# Patient Record
Sex: Female | Born: 1940
Health system: Southern US, Community
[De-identification: ages and names within clinical notes are randomized; demographics above are authoritative.]

## PROBLEM LIST (undated history)

## (undated) ENCOUNTER — Ambulatory Visit

## (undated) DIAGNOSIS — Z923 Personal history of irradiation: Secondary | ICD-10-CM

## (undated) DIAGNOSIS — K219 Gastro-esophageal reflux disease without esophagitis: Secondary | ICD-10-CM

## (undated) DIAGNOSIS — C50919 Malignant neoplasm of unspecified site of unspecified female breast: Secondary | ICD-10-CM

## (undated) DIAGNOSIS — E785 Hyperlipidemia, unspecified: Secondary | ICD-10-CM

## (undated) DIAGNOSIS — Z9221 Personal history of antineoplastic chemotherapy: Secondary | ICD-10-CM

## (undated) HISTORY — DX: Gastro-esophageal reflux disease without esophagitis: K21.9

## (undated) HISTORY — DX: Hyperlipidemia, unspecified: E78.5

## (undated) HISTORY — PX: BREAST SURGERY: SHX581

---

## 1998-05-03 DIAGNOSIS — C50919 Malignant neoplasm of unspecified site of unspecified female breast: Secondary | ICD-10-CM

## 1998-05-03 DIAGNOSIS — Z923 Personal history of irradiation: Secondary | ICD-10-CM

## 1998-05-03 DIAGNOSIS — Z9221 Personal history of antineoplastic chemotherapy: Secondary | ICD-10-CM

## 1998-05-03 HISTORY — DX: Personal history of antineoplastic chemotherapy: Z92.21

## 1998-05-03 HISTORY — PX: MASTECTOMY: SHX3

## 1998-05-03 HISTORY — DX: Personal history of irradiation: Z92.3

## 1998-05-03 HISTORY — DX: Malignant neoplasm of unspecified site of unspecified female breast: C50.919

## 2007-04-10 ENCOUNTER — Ambulatory Visit: Payer: Self-pay | Admitting: Unknown Physician Specialty

## 2007-09-17 ENCOUNTER — Ambulatory Visit: Payer: Self-pay | Admitting: Family Medicine

## 2007-09-24 ENCOUNTER — Ambulatory Visit: Payer: Self-pay | Admitting: Internal Medicine

## 2007-10-04 ENCOUNTER — Ambulatory Visit: Payer: Self-pay | Admitting: Internal Medicine

## 2008-08-13 ENCOUNTER — Ambulatory Visit: Payer: Self-pay | Admitting: Internal Medicine

## 2008-09-10 ENCOUNTER — Ambulatory Visit: Payer: Self-pay | Admitting: Internal Medicine

## 2008-09-10 LAB — PULMONARY FUNCTION TEST

## 2008-11-12 ENCOUNTER — Ambulatory Visit: Payer: Self-pay | Admitting: Internal Medicine

## 2009-10-21 LAB — HM PAP SMEAR: HM Pap smear: NORMAL

## 2011-01-22 ENCOUNTER — Telehealth: Payer: Self-pay | Admitting: Internal Medicine

## 2011-01-22 NOTE — Telephone Encounter (Signed)
Pt would like to get a rx for labs to take to dr Penny Pia  Cancer dr in [place] .  Her appointment is 04/02/11

## 2011-01-25 NOTE — Telephone Encounter (Signed)
Patient is asking for a rx for labs to take with her to Dr. Penny Pia. Patient would like to pick this up when it is ready.

## 2011-01-25 NOTE — Telephone Encounter (Signed)
Lori Caldwell,  Can you please start asking patients a little more information

## 2011-02-02 NOTE — Telephone Encounter (Signed)
Ashey,  I'm not sure if [facility] called this patient back re request for labs.  I do not have her chart abstracted so i do not know what labs she wanted to have done.

## 2011-02-19 ENCOUNTER — Ambulatory Visit (INDEPENDENT_AMBULATORY_CARE_PROVIDER_SITE_OTHER): Payer: Medicare Other

## 2011-02-19 DIAGNOSIS — Z23 Encounter for immunization: Secondary | ICD-10-CM

## 2011-04-05 ENCOUNTER — Telehealth: Payer: Self-pay | Admitting: Internal Medicine

## 2011-04-05 DIAGNOSIS — E785 Hyperlipidemia, unspecified: Secondary | ICD-10-CM

## 2011-04-05 NOTE — Telephone Encounter (Signed)
Patient is asking if there are any labs you would like her to have drawn while she is having labs done at Dr. Algis Liming office. If so she would like it faxed to there office before Friday so that she can have it all done while she there.

## 2011-04-05 NOTE — Telephone Encounter (Signed)
[phone]  Pt is having labs done dr Penny Pia in [place] on Friday 04/09/11.  Please fax an order for any labs that dr Darrick Huntsman would like done. Please call patient when this is faxed in  You can leave message on answering machine

## 2011-04-06 NOTE — Telephone Encounter (Signed)
Please print order for CMET, TSH and fasting lipids. And send to Dr Algis Liming office

## 2011-04-07 NOTE — Telephone Encounter (Signed)
Order placed, printed, and faxed. Patient notified.

## 2011-04-13 ENCOUNTER — Encounter: Payer: Self-pay | Admitting: Internal Medicine

## 2011-04-13 ENCOUNTER — Ambulatory Visit (INDEPENDENT_AMBULATORY_CARE_PROVIDER_SITE_OTHER): Payer: Medicare Other | Admitting: Internal Medicine

## 2011-04-13 VITALS — BP 122/64 | HR 81 | Temp 98.1°F | Resp 16 | Ht 67.0 in | Wt 173.8 lb

## 2011-04-13 DIAGNOSIS — Z853 Personal history of malignant neoplasm of breast: Secondary | ICD-10-CM | POA: Insufficient documentation

## 2011-04-13 DIAGNOSIS — E785 Hyperlipidemia, unspecified: Secondary | ICD-10-CM | POA: Insufficient documentation

## 2011-04-13 DIAGNOSIS — C50919 Malignant neoplasm of unspecified site of unspecified female breast: Secondary | ICD-10-CM

## 2011-04-13 DIAGNOSIS — K219 Gastro-esophageal reflux disease without esophagitis: Secondary | ICD-10-CM

## 2011-04-13 DIAGNOSIS — L259 Unspecified contact dermatitis, unspecified cause: Secondary | ICD-10-CM

## 2011-04-13 DIAGNOSIS — Z124 Encounter for screening for malignant neoplasm of cervix: Secondary | ICD-10-CM | POA: Insufficient documentation

## 2011-04-13 MED ORDER — HYDROCORTISONE ACE-PRAMOXINE 2.5-1 % RE CREA: TOPICAL_CREAM | RECTAL | Status: DC

## 2011-04-13 NOTE — Progress Notes (Signed)
Subjective:    Patient ID: Lori Caldwell, female    DOB: 12/22/1940, 70 y.o.   MRN: [id number]  HPI  Lori Caldwell is a 70 yo white female with a history of breast cancer s/p radical mastectomy .  Followed by Lori Caldwell at [facility], chronic cough with workup notable for reflux, presents for 6 month follouwp  Had a recent URI which has now resolved ,  But has had recent optho/ eval for left eye blurring with draining clear fluid for several months, with associated symptoms of pain (Feels scratchy) by the end of the day.  Getting a second opinion /appt next week.   Her cough has all but resolved with use of Nexium and occasional PM OTC PPI.  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  Past Medical History  Diagnosis Date  . breast cancer   . Hyperlipidemia   . GERD (gastroesophageal reflux disease)    No current outpatient prescriptions on file prior to visit.    . Review of Systems  Constitutional: Negative for fever, chills and unexpected weight change.  HENT: Negative for hearing loss, ear pain, nosebleeds, congestion, sore throat, facial swelling, rhinorrhea, sneezing, mouth sores, trouble swallowing, neck pain, neck stiffness, voice change, postnasal drip, sinus pressure, tinnitus and ear discharge.   Eyes: Negative for pain, discharge, redness and visual disturbance.  Respiratory: Positive for cough. Negative for chest tightness, shortness of breath, wheezing and stridor.   Cardiovascular: Negative for chest pain, palpitations and leg swelling.  Musculoskeletal: Negative for myalgias and arthralgias.  Skin: Negative for color change and rash.  Neurological: Negative for dizziness, weakness, light-headedness and headaches.  Hematological: Negative for adenopathy.   BP 122/64  Pulse 81  Temp(Src) 98.1 F (36.7 C) (Oral)  Resp 16  Ht 5\' 7"  (1.702 m)  Wt 173 lb 12 oz (78.812 kg)  BMI 27.21  kg/m2  SpO2 100%     Objective:   Physical Exam  Constitutional: She is oriented to person, place, and time. She appears well-developed and well-nourished.  HENT:  Mouth/Throat: Oropharynx is clear and moist.  Eyes: EOM are normal. Pupils are equal, round, and reactive to light. No scleral icterus.  Neck: Normal range of motion. Neck supple. No JVD present. No thyromegaly present.  Cardiovascular: Normal rate, regular rhythm, normal heart sounds and intact distal pulses.   Pulmonary/Chest: Effort normal and breath sounds normal.  Abdominal: Soft. Bowel sounds are normal. She exhibits no mass. There is no tenderness.  Musculoskeletal: Normal range of motion. She exhibits no edema.  Lymphadenopathy:    She has no cervical adenopathy.  Neurological: She is alert and oriented to person, place, and time.  Skin: Skin is warm and dry.  Psychiatric: She has a normal mood and affect.          Assessment & Plan:   Hyperlipidemia LDL has improved from 181 to 101 with resuming of simvastatin and LFTS are normal, and she is tolerating medications.  NO chages today. Repeat in 6 months  GERD (gastroesophageal reflux disease) Comprehensive workup was done including ENT referral,  Cough has resolved with daily use of PPI.  No changes today  Screening for cervical cancer Done previously by GYN,  But because of her age she would like to continue screening with me.  She has no history of abnormals and is not taking tamoxifen.  Repeat due next year.  Updated Medication List Outpatient Encounter Prescriptions as of 04/13/2011  Medication Sig Dispense Refill  . aspirin 81 MG tablet Take 81 mg by mouth daily.        Marland Kitchen esomeprazole (NEXIUM) 40 MG capsule Take 40 mg by mouth daily before breakfast.        . Multiple Vitamin (MULTIVITAMIN) tablet Take 1 tablet by mouth daily.        . simvastatin (ZOCOR) 10 MG tablet Take 10 mg by mouth at bedtime.        . hydrocortisone-pramoxine  (ANALPRAM-HC) 2.5-1 % rectal cream Apply topicalyl twice daily as needed  30 g  0

## 2011-04-13 NOTE — Patient Instructions (Signed)
Benadryl at night may help dry up the nasal drainage as the Atrovent is supposed to do (bvut for less $$$$)  Take your simvastatin at bedtime and yor nexium in the morning

## 2011-04-13 NOTE — Assessment & Plan Note (Signed)
Comprehensive workup was done including ENT referral,  Cough has resolved with daily use of PPI.  No changes today

## 2011-04-13 NOTE — Assessment & Plan Note (Signed)
Managed by [facility] oncology  With 6 month followup recently done and noraml breast exam/mammography

## 2011-04-13 NOTE — Assessment & Plan Note (Addendum)
Done previously by GYN,  But because of her age she would like to continue screening with me.  She has no history of abnormals and is not taking tamoxifen.  Repeat due next year.

## 2011-04-13 NOTE — Assessment & Plan Note (Signed)
LDL has improved from 181 to 101 with resuming of simvastatin and LFTS are normal, and she is tolerating medications.  NO chages today. Repeat in 6 months

## 2011-06-07 ENCOUNTER — Other Ambulatory Visit: Payer: Self-pay | Admitting: *Deleted

## 2011-06-07 NOTE — Telephone Encounter (Signed)
Faxed request from [facility], last filled 05/06/11.

## 2011-06-08 ENCOUNTER — Telehealth: Payer: Self-pay | Admitting: *Deleted

## 2011-06-08 MED ORDER — SIMVASTATIN 10 MG PO TABS: 10.0000 mg | ORAL_TABLET | Freq: Every day | ORAL | Status: DC

## 2011-06-08 NOTE — Telephone Encounter (Signed)
Pt states she had told you that she was taking 10 mg's of simvastatin, but she states she is actually taking 40 mg's a day.  Script changed at pharmacy to 40 mg's.  Same amount of refills as yesterday, changed dose on medlist.

## 2011-07-06 DIAGNOSIS — H6120 Impacted cerumen, unspecified ear: Secondary | ICD-10-CM | POA: Diagnosis not present

## 2011-07-06 DIAGNOSIS — K120 Recurrent oral aphthae: Secondary | ICD-10-CM | POA: Diagnosis not present

## 2011-07-07 ENCOUNTER — Encounter: Payer: Self-pay | Admitting: Internal Medicine

## 2011-08-06 DIAGNOSIS — Z853 Personal history of malignant neoplasm of breast: Secondary | ICD-10-CM | POA: Diagnosis not present

## 2011-08-06 DIAGNOSIS — Z9221 Personal history of antineoplastic chemotherapy: Secondary | ICD-10-CM | POA: Diagnosis not present

## 2011-08-06 DIAGNOSIS — C50919 Malignant neoplasm of unspecified site of unspecified female breast: Secondary | ICD-10-CM | POA: Diagnosis not present

## 2011-08-06 DIAGNOSIS — Z923 Personal history of irradiation: Secondary | ICD-10-CM | POA: Diagnosis not present

## 2011-08-06 DIAGNOSIS — Z09 Encounter for follow-up examination after completed treatment for conditions other than malignant neoplasm: Secondary | ICD-10-CM | POA: Diagnosis not present

## 2011-08-06 DIAGNOSIS — Z9010 Acquired absence of unspecified breast and nipple: Secondary | ICD-10-CM | POA: Diagnosis not present

## 2011-08-06 DIAGNOSIS — R928 Other abnormal and inconclusive findings on diagnostic imaging of breast: Secondary | ICD-10-CM | POA: Diagnosis not present

## 2011-08-06 DIAGNOSIS — Z1231 Encounter for screening mammogram for malignant neoplasm of breast: Secondary | ICD-10-CM | POA: Diagnosis not present

## 2011-10-04 DIAGNOSIS — R42 Dizziness and giddiness: Secondary | ICD-10-CM | POA: Diagnosis not present

## 2011-10-04 DIAGNOSIS — H6120 Impacted cerumen, unspecified ear: Secondary | ICD-10-CM | POA: Diagnosis not present

## 2011-10-05 ENCOUNTER — Telehealth: Payer: Self-pay | Admitting: Internal Medicine

## 2011-10-05 ENCOUNTER — Encounter: Payer: Self-pay | Admitting: Internal Medicine

## 2011-10-05 NOTE — Telephone Encounter (Signed)
Letter is on printer. ?

## 2011-10-05 NOTE — Telephone Encounter (Signed)
Patient is going to see Dr. Faith Rogue Oncologist on Friday.  She states that we ask them to draw the blood that we need for our office.  Please contact them by Thursday to let them know what needs to be drawn there number is [phone] or [phone].

## 2011-10-06 NOTE — Telephone Encounter (Signed)
Letter with lab orders faxed to [phone].

## 2011-10-08 DIAGNOSIS — C50919 Malignant neoplasm of unspecified site of unspecified female breast: Secondary | ICD-10-CM | POA: Diagnosis not present

## 2011-10-08 DIAGNOSIS — E785 Hyperlipidemia, unspecified: Secondary | ICD-10-CM | POA: Diagnosis not present

## 2011-10-15 ENCOUNTER — Encounter: Payer: Medicare Other | Admitting: Internal Medicine

## 2011-10-19 DIAGNOSIS — L570 Actinic keratosis: Secondary | ICD-10-CM | POA: Diagnosis not present

## 2011-10-19 DIAGNOSIS — Z85828 Personal history of other malignant neoplasm of skin: Secondary | ICD-10-CM | POA: Diagnosis not present

## 2011-10-19 DIAGNOSIS — R209 Unspecified disturbances of skin sensation: Secondary | ICD-10-CM | POA: Diagnosis not present

## 2011-10-22 ENCOUNTER — Ambulatory Visit (INDEPENDENT_AMBULATORY_CARE_PROVIDER_SITE_OTHER): Payer: Medicare Other | Admitting: Internal Medicine

## 2011-10-22 ENCOUNTER — Other Ambulatory Visit (HOSPITAL_COMMUNITY)
Admission: RE | Admit: 2011-10-22 | Discharge: 2011-10-22 | Disposition: A | Discharge status: Home / Self Care | Payer: Medicare Other | Source: Ambulatory Visit | Attending: Internal Medicine | Admitting: Internal Medicine

## 2011-10-22 ENCOUNTER — Encounter: Payer: Self-pay | Admitting: Internal Medicine

## 2011-10-22 VITALS — BP 114/70 | HR 73 | Temp 97.7°F | Resp 14 | Ht 67.0 in | Wt 167.0 lb

## 2011-10-22 DIAGNOSIS — M949 Disorder of cartilage, unspecified: Secondary | ICD-10-CM

## 2011-10-22 DIAGNOSIS — M899 Disorder of bone, unspecified: Secondary | ICD-10-CM

## 2011-10-22 DIAGNOSIS — E785 Hyperlipidemia, unspecified: Secondary | ICD-10-CM

## 2011-10-22 DIAGNOSIS — Z124 Encounter for screening for malignant neoplasm of cervix: Secondary | ICD-10-CM | POA: Diagnosis not present

## 2011-10-22 DIAGNOSIS — M8580 Other specified disorders of bone density and structure, unspecified site: Secondary | ICD-10-CM | POA: Insufficient documentation

## 2011-10-22 DIAGNOSIS — Z1159 Encounter for screening for other viral diseases: Secondary | ICD-10-CM | POA: Insufficient documentation

## 2011-10-22 DIAGNOSIS — Z0000 Encounter for general adult medical examination without abnormal findings: Secondary | ICD-10-CM | POA: Diagnosis not present

## 2011-10-22 LAB — HM MAMMOGRAPHY: HM Mammogram: NORMAL

## 2011-10-22 LAB — HM PAP SMEAR: HM Pap smear: NORMAL

## 2011-10-22 NOTE — Progress Notes (Signed)
Patient ID: Lori Caldwell, female   DOB: 1940/07/29, 71 y.o.   MRN: [id number] The patient is here for annual Medicare wellness examination and management of other chronic and acute problems.   The risk factors are reflected in the social history.  The roster of all physicians providing medical care to patient - is listed in the Snapshot section of the chart.  Activities of daily living:  The patient is 100% independent in all ADLs: dressing, toileting, feeding as well as independent mobility  Home safety : The patient has smoke detectors in the home. They wear seatbelts.  There are no firearms at home. There is no violence in the home.   There is no risks for hepatitis, STDs or HIV. There is no   history of blood transfusion. They have no travel history to infectious disease endemic areas of the world.  The patient has seen their dentist in the last six month. They have seen their eye doctor in the last year. They admit to slight hearing difficulty with regard to whispered voices and some television programs.  They have deferred audiologic testing in the last year.  They do not  have excessive sun exposure. Discussed the need for sun protection: hats, long sleeves and use of sunscreen if there is significant sun exposure.   Diet: the importance of a healthy diet is discussed. They do have a healthy diet.  The benefits of regular aerobic exercise were discussed. She walks 4 times per week ,  20 minutes.   Depression screen: there are no signs or vegative symptoms of depression- irritability, change in appetite, anhedonia, sadness/tearfullness.  Cognitive assessment: the patient manages all their financial and personal affairs and is actively engaged. They could relate day,date,year and events; recalled 2/3 objects at 3 minutes; performed clock-face test normally.  The following portions of the patient's history were reviewed and updated as appropriate: allergies, current medications, past  family history, past medical history,  past surgical history, past social history  and problem list.  Visual acuity was not assessed per patient preference since she has regular follow up with her ophthalmologist. Hearing and body mass index were assessed and reviewed.   During the course of the visit the patient was educated and counseled about appropriate screening and preventive services including : fall prevention , diabetes screening, nutrition counseling, colorectal cancer screening, and recommended immunizations.   . BP 114/70  Pulse 73  Temp 97.7 F (36.5 C) (Oral)  Resp 14  Ht 5\' 7"  (1.702 m)  Wt 167 lb (75.751 kg)  BMI 26.16 kg/m2  SpO2 96%  General Appearance:    Alert, cooperative, no distress, appears stated age  Head:    Normocephalic, without obvious abnormality, atraumatic  Eyes:    PERRL, conjunctiva/corneas clear, EOM's intact, fundi    benign, both eyes  Ears:    Normal TM's and external ear canals, both ears  Nose:   Nares normal, septum midline, mucosa normal, no drainage    or sinus tenderness  Throat:   Lips, mucosa, and tongue normal; teeth and gums normal  Neck:   Supple, symmetrical, trachea midline, no adenopathy;    thyroid:  no enlargement/tenderness/nodules; no carotid   bruit or JVD  Back:     Symmetric, no curvature, ROM normal, no CVA tenderness  Lungs:     Clear to auscultation bilaterally, respirations unlabored  Chest Wall:    No tenderness or deformity   Heart:    Regular rate and rhythm, S1 and  S2 normal, no murmur, rub   or gallop  Breast Exam:    No tenderness, masses, or nipple abnormality  Abdomen:     Soft, non-tender, bowel sounds active all four quadrants,    no masses, no organomegaly  Genitalia:    Normal female without lesion, discharge or tenderness     Extremities:   Extremities normal, atraumatic, no cyanosis or edema  Pulses:   2+ and symmetric all extremities  Skin:   Skin color, texture, turgor normal, no rashes or lesions    Lymph nodes:   Cervical, supraclavicular, and axillary nodes normal  Neurologic:   CNII-XII intact, normal strength, sensation and reflexes    Throughout

## 2011-10-22 NOTE — Assessment & Plan Note (Signed)
Last PAP 2011, repeated today

## 2011-10-22 NOTE — Patient Instructions (Addendum)
I recommend that you get your TdaP vaccine this year.  It is available at o charge at the health department.  I will review your outside labs and adjust your medications if necessary

## 2011-10-22 NOTE — Assessment & Plan Note (Signed)
Managed with simvastatin 

## 2011-11-01 ENCOUNTER — Telehealth: Payer: Self-pay | Admitting: Internal Medicine

## 2011-11-01 NOTE — Telephone Encounter (Signed)
Have you received blood work from Dr. Danne Harbor office [employer]) ? The patient is wanting Dr. Darrick Huntsman to read the results and inform her of her findings . If you have not received the test results let the patient know and she will get in touch with Dr. Danne Harbor office to have them forwarded to you.

## 2011-11-02 NOTE — Telephone Encounter (Signed)
No, not yet

## 2011-11-02 NOTE — Telephone Encounter (Signed)
I have not seen the results.  Have you?

## 2011-11-03 NOTE — Telephone Encounter (Signed)
Pt called checking to see if we received results from dr hathron's.  Pt is going to call dr Cassandria Santee office to get labs sent please advise when we get these

## 2011-11-03 NOTE — Telephone Encounter (Signed)
Patient notified. She is going to have their office fax them to [place].

## 2011-11-09 ENCOUNTER — Telehealth: Payer: Self-pay | Admitting: Internal Medicine

## 2011-11-09 NOTE — Telephone Encounter (Signed)
Left message notifying patient.

## 2011-11-09 NOTE — Telephone Encounter (Signed)
All labs normal,. Including liver/kidney function and cholesterol

## 2012-01-18 DIAGNOSIS — L570 Actinic keratosis: Secondary | ICD-10-CM | POA: Diagnosis not present

## 2012-02-16 DIAGNOSIS — H6120 Impacted cerumen, unspecified ear: Secondary | ICD-10-CM | POA: Diagnosis not present

## 2012-02-22 DIAGNOSIS — D2330 Other benign neoplasm of skin of unspecified part of face: Secondary | ICD-10-CM | POA: Diagnosis not present

## 2012-03-09 DIAGNOSIS — Z23 Encounter for immunization: Secondary | ICD-10-CM | POA: Diagnosis not present

## 2012-04-14 DIAGNOSIS — H2510 Age-related nuclear cataract, unspecified eye: Secondary | ICD-10-CM | POA: Diagnosis not present

## 2012-04-14 DIAGNOSIS — H524 Presbyopia: Secondary | ICD-10-CM | POA: Diagnosis not present

## 2012-04-14 DIAGNOSIS — H04129 Dry eye syndrome of unspecified lacrimal gland: Secondary | ICD-10-CM | POA: Diagnosis not present

## 2012-04-17 ENCOUNTER — Telehealth: Payer: Self-pay | Admitting: Internal Medicine

## 2012-04-17 NOTE — Telephone Encounter (Signed)
Patient needs a CMET drawn by her onoclogist and sent to me.  Letter to Dr Dolores Frame requesting CMET on printer.Please fax thanks

## 2012-04-17 NOTE — Telephone Encounter (Signed)
Pt called wanting to know if dr Darrick Huntsman wants her to have any labs done she has an appointment with  Dr Cira Servant her oncology dec 26.  Please fax order to their office the fax number [phone] Please let pt know when this is called in.

## 2012-04-27 DIAGNOSIS — C50919 Malignant neoplasm of unspecified site of unspecified female breast: Secondary | ICD-10-CM | POA: Diagnosis not present

## 2012-04-27 DIAGNOSIS — E785 Hyperlipidemia, unspecified: Secondary | ICD-10-CM | POA: Diagnosis not present

## 2012-04-27 DIAGNOSIS — Z171 Estrogen receptor negative status [ER-]: Secondary | ICD-10-CM | POA: Diagnosis not present

## 2012-04-27 DIAGNOSIS — Z79899 Other long term (current) drug therapy: Secondary | ICD-10-CM | POA: Diagnosis not present

## 2012-04-27 DIAGNOSIS — Z853 Personal history of malignant neoplasm of breast: Secondary | ICD-10-CM | POA: Diagnosis not present

## 2012-04-27 NOTE — Telephone Encounter (Signed)
I found the proof that this letter has been faxed on 04/17/12 in [place] area.

## 2012-05-11 ENCOUNTER — Telehealth: Payer: Self-pay | Admitting: Internal Medicine

## 2012-05-11 NOTE — Telephone Encounter (Signed)
Patient requesting lab results

## 2012-05-12 NOTE — Telephone Encounter (Signed)
What results does the pt need?

## 2012-05-15 ENCOUNTER — Telehealth: Payer: Self-pay | Admitting: Internal Medicine

## 2012-05-15 NOTE — Telephone Encounter (Signed)
LMOVM notifying pt that we do not have labs from their office.

## 2012-05-15 NOTE — Telephone Encounter (Signed)
Pt called checking results on labs from 12/26   Labs were done dr hawthorn @ [facility]

## 2012-05-24 ENCOUNTER — Telehealth: Payer: Self-pay | Admitting: Internal Medicine

## 2012-05-24 NOTE — Telephone Encounter (Signed)
All labs normal,. Including liver/kidney function and cholesterol (total is 189, HDL is 76 LDL is 89)

## 2012-05-24 NOTE — Telephone Encounter (Signed)
Pt called to get results for labs 04/27/12 Pt had labs done [facility] dr Rollene Fare office  Please advise pt

## 2012-05-25 ENCOUNTER — Telehealth: Payer: Self-pay | Admitting: Internal Medicine

## 2012-05-25 NOTE — Telephone Encounter (Signed)
Pt had labs done at Dr. Mardene Celeste office on 12/26.  Pt states Dr. Mardene Celeste office was to fax the results to [place] and we were to contact pt with results.  Please advise pt.

## 2012-05-25 NOTE — Telephone Encounter (Signed)
Pt.notified

## 2012-05-25 NOTE — Telephone Encounter (Signed)
Pt notified of lab results

## 2012-08-24 DIAGNOSIS — C50919 Malignant neoplasm of unspecified site of unspecified female breast: Secondary | ICD-10-CM | POA: Insufficient documentation

## 2012-08-25 DIAGNOSIS — Z1231 Encounter for screening mammogram for malignant neoplasm of breast: Secondary | ICD-10-CM | POA: Diagnosis not present

## 2012-09-11 ENCOUNTER — Other Ambulatory Visit: Payer: Self-pay | Admitting: *Deleted

## 2012-09-11 MED ORDER — SIMVASTATIN 40 MG PO TABS: 40.0000 mg | ORAL_TABLET | Freq: Every evening | ORAL | Status: DC

## 2012-11-01 DIAGNOSIS — H6120 Impacted cerumen, unspecified ear: Secondary | ICD-10-CM | POA: Diagnosis not present

## 2012-11-09 ENCOUNTER — Telehealth: Payer: Self-pay | Admitting: *Deleted

## 2012-11-09 NOTE — Telephone Encounter (Signed)
Letter on printer for labs

## 2012-11-09 NOTE — Telephone Encounter (Signed)
Patient called to let you know she is seeing Dr. Penny Pia on Monday and wanted to know what labs you will need for her yearly and would fax order to dr. Penny Pia office Fax: [phone] when we receive results patient will schedule appointment here.

## 2012-11-10 NOTE — Telephone Encounter (Signed)
Faxed letter to Dr. Penny Pia office for patient 's labs

## 2012-11-13 DIAGNOSIS — R5383 Other fatigue: Secondary | ICD-10-CM | POA: Diagnosis not present

## 2012-11-13 DIAGNOSIS — E559 Vitamin D deficiency, unspecified: Secondary | ICD-10-CM | POA: Diagnosis not present

## 2012-11-13 DIAGNOSIS — C50919 Malignant neoplasm of unspecified site of unspecified female breast: Secondary | ICD-10-CM | POA: Diagnosis not present

## 2012-11-13 DIAGNOSIS — E785 Hyperlipidemia, unspecified: Secondary | ICD-10-CM | POA: Diagnosis not present

## 2012-11-13 DIAGNOSIS — R5381 Other malaise: Secondary | ICD-10-CM | POA: Diagnosis not present

## 2012-11-14 LAB — BASIC METABOLIC PANEL
Creatinine: 0.8 mg/dL (ref 0.5–1.1)
Potassium: 4.5 mmol/L (ref 3.4–5.3)
Sodium: 137 mmol/L (ref 137–147)

## 2012-11-14 LAB — HEPATIC FUNCTION PANEL
AST: 22 U/L (ref 13–35)
Alkaline Phosphatase: 52 U/L (ref 25–125)
Bilirubin, Total: 0.6 mg/dL

## 2012-11-14 LAB — LIPID PANEL
Cholesterol: 202 mg/dL — AB (ref 0–200)
Triglycerides: 218 mg/dL — AB (ref 40–160)

## 2012-12-21 ENCOUNTER — Ambulatory Visit (INDEPENDENT_AMBULATORY_CARE_PROVIDER_SITE_OTHER): Payer: Medicare Other | Admitting: Internal Medicine

## 2012-12-21 ENCOUNTER — Encounter: Payer: Self-pay | Admitting: Internal Medicine

## 2012-12-21 VITALS — BP 132/78 | HR 71 | Temp 97.6°F | Resp 14 | Ht 67.0 in | Wt 173.2 lb

## 2012-12-21 DIAGNOSIS — Z0000 Encounter for general adult medical examination without abnormal findings: Secondary | ICD-10-CM

## 2012-12-21 DIAGNOSIS — C50919 Malignant neoplasm of unspecified site of unspecified female breast: Secondary | ICD-10-CM | POA: Diagnosis not present

## 2012-12-21 DIAGNOSIS — Z23 Encounter for immunization: Secondary | ICD-10-CM

## 2012-12-21 DIAGNOSIS — M899 Disorder of bone, unspecified: Secondary | ICD-10-CM | POA: Diagnosis not present

## 2012-12-21 DIAGNOSIS — E785 Hyperlipidemia, unspecified: Secondary | ICD-10-CM

## 2012-12-21 DIAGNOSIS — Z853 Personal history of malignant neoplasm of breast: Secondary | ICD-10-CM

## 2012-12-21 DIAGNOSIS — Z124 Encounter for screening for malignant neoplasm of cervix: Secondary | ICD-10-CM

## 2012-12-21 DIAGNOSIS — C50911 Malignant neoplasm of unspecified site of right female breast: Secondary | ICD-10-CM

## 2012-12-21 DIAGNOSIS — M8580 Other specified disorders of bone density and structure, unspecified site: Secondary | ICD-10-CM

## 2012-12-21 MED ORDER — ESOMEPRAZOLE MAGNESIUM 40 MG PO CPDR: 40.0000 mg | DELAYED_RELEASE_CAPSULE | Freq: Every day | ORAL | Status: DC

## 2012-12-21 MED ORDER — TRIAMCINOLONE ACETONIDE 0.1 % EX CREA: TOPICAL_CREAM | Freq: Two times a day (BID) | CUTANEOUS | Status: DC

## 2012-12-21 NOTE — Patient Instructions (Addendum)
You had your annual Medicare wellness exam today.  You are not due for a PAP smear until 2016  We will schedule your mammogram  At the [facility] in December  Please use the stool kit to send [place] back a sample to test for blood.  This is your colon CA screening test that we will do annualy until your next colonoscopy is due in 2018   You received your second and final pneumonia vaccine today.  We will contact you with the bloodwork results that were done last month at [facility]

## 2012-12-21 NOTE — Assessment & Plan Note (Addendum)
She had a normal left breast exam and mammogram in April 2014 done by Dr. Arnetha Courser.  Her next left breast mammogram is due in April 2015. Dr. Penny Pia is moving his practice to [facility] and she is requesting that I assume management of followup surveillance. She does not want to see an oncologist at this point unless it was necessary. She has been cancer free since her treatment which was completed in March 2001 .

## 2012-12-24 ENCOUNTER — Encounter: Payer: Self-pay | Admitting: Internal Medicine

## 2012-12-24 DIAGNOSIS — Z0000 Encounter for general adult medical examination without abnormal findings: Secondary | ICD-10-CM | POA: Insufficient documentation

## 2012-12-24 NOTE — Assessment & Plan Note (Signed)
Managed with Zocor and baby aspirin with no side effects.

## 2012-12-24 NOTE — Progress Notes (Signed)
Patient ID: Lori Caldwell, female   DOB: November 13, 1940, 72 y.o.   MRN: [id number]  The patient is here for annual Medicare wellness examination and management of other chronic and acute problems.   The risk factors are reflected in the social history.  The roster of all physicians providing medical care to patient - is listed in the Snapshot section of the chart.  Activities of daily living:  The patient is 100% independent in all ADLs: dressing, toileting, feeding as well as independent mobility  Home safety : The patient has smoke detectors in the home. They wear seatbelts.  There are no firearms at home. There is no violence in the home.   There is no risks for hepatitis, STDs or HIV. There is no   history of blood transfusion. They have no travel history to infectious disease endemic areas of the world.  The patient has seen their dentist in the last six month. They have seen their eye doctor in the last year. They admit to slight hearing difficulty with regard to whispered voices and some television programs.  They have deferred audiologic testing in the last year.  They do not  have excessive sun exposure. Discussed the need for sun protection: hats, long sleeves and use of sunscreen if there is significant sun exposure.   Diet: the importance of a healthy diet is discussed. They do have a healthy diet.  The benefits of regular aerobic exercise were discussed. She walks 4 times per week ,  20 minutes.   Depression screen: there are no signs or vegative symptoms of depression- irritability, change in appetite, anhedonia, sadness/tearfullness.  Cognitive assessment: the patient manages all their financial and personal affairs and is actively engaged. They could relate day,date,year and events; recalled 2/3 objects at 3 minutes; performed clock-face test normally.  The following portions of the patient's history were reviewed and updated as appropriate: allergies, current medications, past  family history, past medical history,  past surgical history, past social history  and problem list.  Visual acuity was not assessed per patient preference since she has regular follow up with her ophthalmologist. Hearing and body mass index were assessed and reviewed.   During the course of the visit the patient was educated and counseled about appropriate screening and preventive services including : fall prevention , diabetes screening, nutrition counseling, colorectal cancer screening, and recommended immunizations.    Objective:  BP 132/78  Pulse 71  Temp(Src) 97.6 F (36.4 C) (Oral)  Resp 14  Ht 5\' 7"  (1.702 m)  Wt 173 lb 4 oz (78.586 kg)  BMI 27.13 kg/m2  SpO2 99%  General appearance: alert, cooperative and appears stated age Ears: normal TM's and external ear canals both ears Throat: lips, mucosa, and tongue normal; teeth and gums normal Neck: no adenopathy, no carotid bruit, supple, symmetrical, trachea midline and thyroid not enlarged, symmetric, no tenderness/mass/nodules Back: symmetric, no curvature. ROM normal. No CVA tenderness. Lungs: clear to auscultation bilaterally Heart: regular rate and rhythm, S1, S2 normal, no murmur, click, rub or gallop Abdomen: soft, non-tender; bowel sounds normal; no masses,  no organomegaly Pulses: 2+ and symmetric Skin: Skin color, texture, turgor normal. No rashes or lesions Lymph nodes: Cervical, supraclavicular, and axillary nodes normal.  Assessment and Plan:   Personal history of breast cancer She had a normal left breast exam and mammogram in April 2014 done by Dr. Arnetha Courser.  Her next left breast mammogram is due in April 2015. Dr. Penny Pia is moving his practice to  [place] and she is requesting that I assume management of followup surveillance. She does not want to see an oncologist at this point unless it was necessary. She has been cancer free since her treatment which was completed in March 2001 .   Hyperlipidemia Managed  with Zocor and baby aspirin with no side effects.  Other and unspecified hyperlipidemia     Screening for cervical cancer No further screening is necessary unless patient desires to have one more in 2016  Osteopenia due to cancer therapy She is due for repeat DEXA scan in 2015 for mild bone loss due to cancer therapy. She is walking regularly for exercise and taking calcium and vitamin D.  Routine general medical examination at a health care facility Annual comprehensive exam was done excluding breast, pelvic exam. All screenings have been addressed .    Updated Medication List Outpatient Encounter Prescriptions as of 12/21/2012  Medication Sig Dispense Refill  . aspirin 81 MG tablet Take 81 mg by mouth daily.        Marland Kitchen esomeprazole (NEXIUM) 40 MG capsule Take 1 capsule (40 mg total) by mouth daily before breakfast.  30 capsule  11  . hydrocortisone-pramoxine (ANALPRAM-HC) 2.5-1 % rectal cream Apply topicalyl twice daily as needed  30 g  0  . Multiple Vitamin (MULTIVITAMIN) tablet Take 1 tablet by mouth daily.        . simvastatin (ZOCOR) 40 MG tablet Take 1 tablet (40 mg total) by mouth every evening.  30 tablet  5  . [DISCONTINUED] esomeprazole (NEXIUM) 40 MG capsule Take 40 mg by mouth daily before breakfast.        . triamcinolone cream (KENALOG) 0.1 % Apply topically 2 (two) times daily.  30 g  0   No facility-administered encounter medications on file as of 12/21/2012.

## 2012-12-24 NOTE — Assessment & Plan Note (Signed)
She is due for repeat DEXA scan in 2015 for mild bone loss due to cancer therapy. She is walking regularly for exercise and taking calcium and vitamin D.

## 2012-12-24 NOTE — Assessment & Plan Note (Signed)
No further screening is necessary unless patient desires to have one more in 2016

## 2012-12-24 NOTE — Assessment & Plan Note (Signed)
Annual comprehensive exam was done excluding breast, pelvic exam. All screenings have been addressed.  

## 2012-12-25 ENCOUNTER — Other Ambulatory Visit (INDEPENDENT_AMBULATORY_CARE_PROVIDER_SITE_OTHER): Payer: Medicare Other

## 2012-12-25 ENCOUNTER — Other Ambulatory Visit: Payer: Self-pay | Admitting: *Deleted

## 2012-12-25 DIAGNOSIS — Z1211 Encounter for screening for malignant neoplasm of colon: Secondary | ICD-10-CM

## 2012-12-26 ENCOUNTER — Encounter: Payer: Self-pay | Admitting: Internal Medicine

## 2012-12-26 ENCOUNTER — Encounter: Payer: Self-pay | Admitting: *Deleted

## 2012-12-26 LAB — FECAL OCCULT BLOOD, IMMUNOCHEMICAL: Fecal Occult Bld: NEGATIVE

## 2013-01-16 ENCOUNTER — Telehealth: Payer: Self-pay | Admitting: Internal Medicine

## 2013-01-16 DIAGNOSIS — E785 Hyperlipidemia, unspecified: Secondary | ICD-10-CM

## 2013-01-16 NOTE — Telephone Encounter (Signed)
Received labs today in yellow folder please advise?

## 2013-01-16 NOTE — Telephone Encounter (Signed)
Calling for results.  States she has left vm msg.  Can call pt on either phone.  She is very concerned about learning her test results.

## 2013-01-16 NOTE — Telephone Encounter (Signed)
LDL is at goal on zocor.  Triglycerides were mildly elevated at 218,  Low carb diet and regular exercise advised to lower to goal of 150 or less.  Thyroid fine, Vit D normal   You can make her a copy if she wants them.  I have placed them in red folder

## 2013-01-16 NOTE — Telephone Encounter (Signed)
I have not reviewed them yet and will let her know when I have seen them

## 2013-01-16 NOTE — Assessment & Plan Note (Signed)
LDL is at goal on zocor.  Triglycerides were mildly elevated at 218,  Low carb diet and regular exercise advised to lower to goal of 150 or less.

## 2013-01-16 NOTE — Telephone Encounter (Signed)
Patient notified of results as requested and labs sent to scan

## 2013-01-26 ENCOUNTER — Encounter: Payer: Self-pay | Admitting: Internal Medicine

## 2013-03-06 DIAGNOSIS — Z23 Encounter for immunization: Secondary | ICD-10-CM | POA: Diagnosis not present

## 2013-04-11 DIAGNOSIS — H6120 Impacted cerumen, unspecified ear: Secondary | ICD-10-CM | POA: Diagnosis not present

## 2013-04-11 DIAGNOSIS — H903 Sensorineural hearing loss, bilateral: Secondary | ICD-10-CM | POA: Diagnosis not present

## 2013-04-23 ENCOUNTER — Other Ambulatory Visit: Payer: Self-pay | Admitting: *Deleted

## 2013-04-23 MED ORDER — SIMVASTATIN 40 MG PO TABS: 40.0000 mg | ORAL_TABLET | Freq: Every evening | ORAL | Status: DC

## 2013-05-08 DIAGNOSIS — H04129 Dry eye syndrome of unspecified lacrimal gland: Secondary | ICD-10-CM | POA: Diagnosis not present

## 2013-05-08 DIAGNOSIS — H2510 Age-related nuclear cataract, unspecified eye: Secondary | ICD-10-CM | POA: Diagnosis not present

## 2013-05-08 DIAGNOSIS — H43819 Vitreous degeneration, unspecified eye: Secondary | ICD-10-CM | POA: Diagnosis not present

## 2013-05-08 DIAGNOSIS — H524 Presbyopia: Secondary | ICD-10-CM | POA: Diagnosis not present

## 2013-06-01 ENCOUNTER — Telehealth: Payer: Self-pay | Admitting: Internal Medicine

## 2013-06-01 DIAGNOSIS — R5383 Other fatigue: Secondary | ICD-10-CM

## 2013-06-01 DIAGNOSIS — E559 Vitamin D deficiency, unspecified: Secondary | ICD-10-CM

## 2013-06-01 DIAGNOSIS — E785 Hyperlipidemia, unspecified: Secondary | ICD-10-CM

## 2013-06-01 DIAGNOSIS — R5381 Other malaise: Secondary | ICD-10-CM

## 2013-06-01 NOTE — Telephone Encounter (Signed)
Lab appt scheduled by pt 2/23.  No orders in.

## 2013-06-25 ENCOUNTER — Other Ambulatory Visit (INDEPENDENT_AMBULATORY_CARE_PROVIDER_SITE_OTHER): Payer: Medicare Other

## 2013-06-25 DIAGNOSIS — E785 Hyperlipidemia, unspecified: Secondary | ICD-10-CM

## 2013-06-25 DIAGNOSIS — E559 Vitamin D deficiency, unspecified: Secondary | ICD-10-CM

## 2013-06-25 DIAGNOSIS — R5383 Other fatigue: Secondary | ICD-10-CM | POA: Diagnosis not present

## 2013-06-25 DIAGNOSIS — R5381 Other malaise: Secondary | ICD-10-CM

## 2013-06-25 LAB — CBC WITH DIFFERENTIAL/PLATELET
BASOS ABS: 0 10*3/uL (ref 0.0–0.1)
BASOS PCT: 0.2 % (ref 0.0–3.0)
Eosinophils Absolute: 0 10*3/uL (ref 0.0–0.7)
Eosinophils Relative: 0.6 % (ref 0.0–5.0)
HEMATOCRIT: 45.6 % (ref 36.0–46.0)
HEMOGLOBIN: 14.8 g/dL (ref 12.0–15.0)
LYMPHS ABS: 1.9 10*3/uL (ref 0.7–4.0)
Lymphocytes Relative: 28.5 % (ref 12.0–46.0)
MCHC: 32.5 g/dL (ref 30.0–36.0)
MCV: 99 fl (ref 78.0–100.0)
Monocytes Absolute: 0.4 10*3/uL (ref 0.1–1.0)
Monocytes Relative: 5.5 % (ref 3.0–12.0)
NEUTROS ABS: 4.4 10*3/uL (ref 1.4–7.7)
Neutrophils Relative %: 65.2 % (ref 43.0–77.0)
Platelets: 246 10*3/uL (ref 150.0–400.0)
RBC: 4.61 Mil/uL (ref 3.87–5.11)
RDW: 13.1 % (ref 11.5–14.6)
WBC: 6.8 10*3/uL (ref 4.5–10.5)

## 2013-06-25 LAB — COMPREHENSIVE METABOLIC PANEL
ALT: 29 U/L (ref 0–35)
AST: 29 U/L (ref 0–37)
Albumin: 4.4 g/dL (ref 3.5–5.2)
Alkaline Phosphatase: 54 U/L (ref 39–117)
BILIRUBIN TOTAL: 0.9 mg/dL (ref 0.3–1.2)
BUN: 14 mg/dL (ref 6–23)
CALCIUM: 10 mg/dL (ref 8.4–10.5)
CHLORIDE: 102 meq/L (ref 96–112)
CO2: 27 meq/L (ref 19–32)
CREATININE: 0.8 mg/dL (ref 0.4–1.2)
GFR: 70.9 mL/min (ref >60.00)
Glucose, Bld: 71 mg/dL (ref 70–99)
Potassium: 4 mEq/L (ref 3.5–5.1)
Sodium: 138 mEq/L (ref 135–145)
Total Protein: 7.9 g/dL (ref 6.0–8.3)

## 2013-06-25 LAB — LIPID PANEL
CHOLESTEROL: 206 mg/dL — AB (ref 0–200)
HDL: 69.2 mg/dL (ref >39.00)
TRIGLYCERIDES: 124 mg/dL (ref 0.0–149.0)
Total CHOL/HDL Ratio: 3
VLDL: 24.8 mg/dL (ref 0.0–40.0)

## 2013-06-25 LAB — LDL CHOLESTEROL, DIRECT: Direct LDL: 117.8 mg/dL

## 2013-06-26 LAB — TSH: TSH: 3.16 u[IU]/mL (ref 0.35–5.50)

## 2013-06-26 LAB — VITAMIN D 25 HYDROXY (VIT D DEFICIENCY, FRACTURES): VIT D 25 HYDROXY: 52 ng/mL (ref 30–89)

## 2013-06-27 ENCOUNTER — Encounter: Payer: Self-pay | Admitting: *Deleted

## 2013-08-20 ENCOUNTER — Encounter: Payer: Self-pay | Admitting: Family Medicine

## 2013-08-20 ENCOUNTER — Telehealth: Payer: Self-pay | Admitting: Internal Medicine

## 2013-08-20 ENCOUNTER — Ambulatory Visit (INDEPENDENT_AMBULATORY_CARE_PROVIDER_SITE_OTHER): Payer: Medicare Other | Admitting: Family Medicine

## 2013-08-20 VITALS — BP 126/70 | HR 95 | Temp 98.4°F | Wt 168.2 lb

## 2013-08-20 DIAGNOSIS — R197 Diarrhea, unspecified: Secondary | ICD-10-CM | POA: Diagnosis not present

## 2013-08-20 NOTE — Telephone Encounter (Signed)
Patient Information:  Caller Name: Nadalyn  Phone: [phone]  Patient: Sahara, Fujimoto  Gender: Female  DOB: 01/15/1941  Age: 73 Years  PCP: Deborra Medina (Adults only)  Office Follow Up:  Does the office need to follow up with this patient?: No  Instructions For The Office: N/A  RN Note:  Home care advice and call back information given.  Pt agrees to OV today.  Symptoms  Reason For Call & Symptoms: Pt states that she currently has diarrhea.  Pt reports a remote HA and nausea.  Diarrhea x 8 times.  Pt has taken Immodium this am.  Pt reports that she is able to drink.  Afebrile.  Abd pain is present.  Epigastric area.   Worse just before BM.  Cramping type pain.  Abd pain is Moderate  Reviewed Health History In EMR: Yes  Reviewed Medications In EMR: Yes  Reviewed Allergies In EMR: Yes  Reviewed Surgeries / Procedures: Yes  Date of Onset of Symptoms: 08/18/2013  Guideline(s) Used:  Diarrhea  Disposition Per Guideline:   Go to Office Now  Reason For Disposition Reached:   Age < 68 years and has had > 15 diarrhea stools in past 24 hours  Advice Given:  Reassurance:  In healthy adults, new-onset diarrhea is usually caused by a viral infection of the intestines, which you can treat at home. Diarrhea is the body's way of getting rid of the infection. Here are some tips on how to keep ahead of the fluid losses.  Fluids:  Drink more fluids, at least 8-10 glasses (8 oz or 240 ml) daily.  For example: sports drinks, diluted fruit juices, soft drinks.  Nutrition:  Maintaining some food intake during episodes of diarrhea is important.  Ideal initial foods include boiled starches/cereals (e.g., potatoes, rice, noodles, wheat, oats) with a small amount of salt to taste.  Other acceptable foods include: bananas, yogurt, crackers, soup.  Expected Course:  Viral diarrhea lasts 4-7 days. Always worse on days 1 and 2.  Call Back If:  You become worse.  Patient Will Follow Care  Advice:  YES  Appointment Scheduled:  08/20/2013 14:00:00 Appointment Scheduled Provider:  Other

## 2013-08-20 NOTE — Progress Notes (Signed)
Pre visit review using our clinic review tool, if applicable. No additional management support is needed unless otherwise documented below in the visit note.  Sx started about 2 days ago.  Was profusely nauseated.  Frequent diarrhea in the interval.  HA in the interval, better now.  Still with diarrhea but some better today after taking imodium. No known sick contacts.  No exotic foods.  No travel.  Was prev some lightheaded, better now.  Minimal PO intake, except for still drinking fluids well.  No fevers known, no rash.    Meds, vitals, and allergies reviewed.   ROS: See HPI.  Otherwise, noncontributory.  GEN: nad, alert and oriented HEENT: mucous membranes moist NECK: supple w/o LA CV: rrr PULM: ctab, no inc wob ABD: soft, +bs EXT: no edema SKIN: no acute rash

## 2013-08-20 NOTE — Assessment & Plan Note (Signed)
Likely resolving AGE, nontoxic, imodium prn and f/u prn.  Continue clear liquids in meantime.  D/w pt.

## 2013-08-20 NOTE — Patient Instructions (Signed)
Wash your hands, drink fluids as tolerated and avoid diary.  Gradually return to eating solids, start with bland foods.  This should get better.  The imodium may help in the meantime.  Take care.

## 2013-08-20 NOTE — Telephone Encounter (Signed)
FYI. Pt c/o diarrhea, scheduled for an office visit today.

## 2013-09-03 DIAGNOSIS — Z0279 Encounter for issue of other medical certificate: Secondary | ICD-10-CM

## 2013-09-03 DIAGNOSIS — L819 Disorder of pigmentation, unspecified: Secondary | ICD-10-CM | POA: Diagnosis not present

## 2013-09-03 DIAGNOSIS — L570 Actinic keratosis: Secondary | ICD-10-CM | POA: Diagnosis not present

## 2013-09-06 DIAGNOSIS — Z853 Personal history of malignant neoplasm of breast: Secondary | ICD-10-CM | POA: Diagnosis not present

## 2013-09-06 DIAGNOSIS — R922 Inconclusive mammogram: Secondary | ICD-10-CM | POA: Diagnosis not present

## 2013-09-27 LAB — HM MAMMOGRAPHY: HM Mammogram: NORMAL

## 2013-10-10 DIAGNOSIS — H903 Sensorineural hearing loss, bilateral: Secondary | ICD-10-CM | POA: Diagnosis not present

## 2013-10-10 DIAGNOSIS — H6120 Impacted cerumen, unspecified ear: Secondary | ICD-10-CM | POA: Diagnosis not present

## 2013-11-08 ENCOUNTER — Telehealth: Payer: Self-pay | Admitting: Internal Medicine

## 2013-11-08 DIAGNOSIS — R5381 Other malaise: Secondary | ICD-10-CM

## 2013-11-08 DIAGNOSIS — E785 Hyperlipidemia, unspecified: Secondary | ICD-10-CM

## 2013-11-08 DIAGNOSIS — Z79899 Other long term (current) drug therapy: Secondary | ICD-10-CM

## 2013-11-08 DIAGNOSIS — R5383 Other fatigue: Secondary | ICD-10-CM

## 2013-11-08 NOTE — Telephone Encounter (Signed)
Needing labs orders for her physical put in the system.

## 2013-11-09 NOTE — Telephone Encounter (Signed)
Please advise 

## 2013-11-09 NOTE — Telephone Encounter (Signed)
done

## 2013-11-19 DIAGNOSIS — Z85828 Personal history of other malignant neoplasm of skin: Secondary | ICD-10-CM | POA: Diagnosis not present

## 2013-11-19 DIAGNOSIS — C44519 Basal cell carcinoma of skin of other part of trunk: Secondary | ICD-10-CM | POA: Diagnosis not present

## 2013-11-19 DIAGNOSIS — L821 Other seborrheic keratosis: Secondary | ICD-10-CM | POA: Diagnosis not present

## 2013-11-19 DIAGNOSIS — D485 Neoplasm of uncertain behavior of skin: Secondary | ICD-10-CM | POA: Diagnosis not present

## 2013-12-20 DIAGNOSIS — C44519 Basal cell carcinoma of skin of other part of trunk: Secondary | ICD-10-CM | POA: Diagnosis not present

## 2013-12-24 ENCOUNTER — Other Ambulatory Visit (INDEPENDENT_AMBULATORY_CARE_PROVIDER_SITE_OTHER): Payer: Medicare Other

## 2013-12-24 DIAGNOSIS — Z79899 Other long term (current) drug therapy: Secondary | ICD-10-CM

## 2013-12-24 DIAGNOSIS — R5381 Other malaise: Secondary | ICD-10-CM

## 2013-12-24 DIAGNOSIS — R5383 Other fatigue: Secondary | ICD-10-CM | POA: Diagnosis not present

## 2013-12-24 DIAGNOSIS — E785 Hyperlipidemia, unspecified: Secondary | ICD-10-CM | POA: Diagnosis not present

## 2013-12-24 LAB — LIPID PANEL
CHOL/HDL RATIO: 3
CHOLESTEROL: 179 mg/dL (ref 0–200)
HDL: 58.4 mg/dL (ref >39.00)
LDL Cholesterol: 83 mg/dL (ref 0–99)
NonHDL: 120.6
Triglycerides: 189 mg/dL — ABNORMAL HIGH (ref 0.0–149.0)
VLDL: 37.8 mg/dL (ref 0.0–40.0)

## 2013-12-24 LAB — COMPREHENSIVE METABOLIC PANEL
ALBUMIN: 4.1 g/dL (ref 3.5–5.2)
ALT: 25 U/L (ref 0–35)
AST: 31 U/L (ref 0–37)
Alkaline Phosphatase: 51 U/L (ref 39–117)
BUN: 11 mg/dL (ref 6–23)
CALCIUM: 9.6 mg/dL (ref 8.4–10.5)
CO2: 26 meq/L (ref 19–32)
CREATININE: 0.8 mg/dL (ref 0.4–1.2)
Chloride: 105 mEq/L (ref 96–112)
GFR: 74.9 mL/min (ref >60.00)
GLUCOSE: 74 mg/dL (ref 70–99)
POTASSIUM: 4.3 meq/L (ref 3.5–5.1)
Sodium: 140 mEq/L (ref 135–145)
Total Bilirubin: 0.7 mg/dL (ref 0.2–1.2)
Total Protein: 7.4 g/dL (ref 6.0–8.3)

## 2013-12-24 LAB — CBC WITH DIFFERENTIAL/PLATELET
BASOS PCT: 0.3 % (ref 0.0–3.0)
Basophils Absolute: 0 10*3/uL (ref 0.0–0.1)
EOS ABS: 0.1 10*3/uL (ref 0.0–0.7)
EOS PCT: 1 % (ref 0.0–5.0)
HCT: 41.7 % (ref 36.0–46.0)
HEMOGLOBIN: 14.3 g/dL (ref 12.0–15.0)
Lymphocytes Relative: 35.5 % (ref 12.0–46.0)
Lymphs Abs: 2.3 10*3/uL (ref 0.7–4.0)
MCHC: 34.3 g/dL (ref 30.0–36.0)
MCV: 95.3 fl (ref 78.0–100.0)
Monocytes Absolute: 0.5 10*3/uL (ref 0.1–1.0)
Monocytes Relative: 7.4 % (ref 3.0–12.0)
NEUTROS ABS: 3.6 10*3/uL (ref 1.4–7.7)
Neutrophils Relative %: 55.8 % (ref 43.0–77.0)
Platelets: 229 10*3/uL (ref 150.0–400.0)
RBC: 4.38 Mil/uL (ref 3.87–5.11)
RDW: 13.5 % (ref 11.5–15.5)
WBC: 6.5 10*3/uL (ref 4.0–10.5)

## 2013-12-27 ENCOUNTER — Encounter: Payer: Self-pay | Admitting: *Deleted

## 2013-12-28 ENCOUNTER — Encounter: Payer: Self-pay | Admitting: Internal Medicine

## 2013-12-28 ENCOUNTER — Ambulatory Visit (INDEPENDENT_AMBULATORY_CARE_PROVIDER_SITE_OTHER): Payer: Medicare Other | Admitting: Internal Medicine

## 2013-12-28 VITALS — BP 136/68 | HR 79 | Temp 97.7°F | Resp 16 | Ht 67.25 in | Wt 169.8 lb

## 2013-12-28 DIAGNOSIS — M949 Disorder of cartilage, unspecified: Secondary | ICD-10-CM | POA: Diagnosis not present

## 2013-12-28 DIAGNOSIS — Z124 Encounter for screening for malignant neoplasm of cervix: Secondary | ICD-10-CM

## 2013-12-28 DIAGNOSIS — Z23 Encounter for immunization: Secondary | ICD-10-CM | POA: Diagnosis not present

## 2013-12-28 DIAGNOSIS — Z0000 Encounter for general adult medical examination without abnormal findings: Secondary | ICD-10-CM | POA: Diagnosis not present

## 2013-12-28 DIAGNOSIS — M899 Disorder of bone, unspecified: Secondary | ICD-10-CM | POA: Diagnosis not present

## 2013-12-28 DIAGNOSIS — Z853 Personal history of malignant neoplasm of breast: Secondary | ICD-10-CM

## 2013-12-28 DIAGNOSIS — M8580 Other specified disorders of bone density and structure, unspecified site: Secondary | ICD-10-CM

## 2013-12-28 MED ORDER — HYDROCORTISONE 2.5 % EX CREA: TOPICAL_CREAM | Freq: Two times a day (BID) | CUTANEOUS | Status: DC

## 2013-12-28 NOTE — Progress Notes (Signed)
Patient ID: Lori Caldwell, female   DOB: [birth year], 73 y.o.   MRN: [id number] The patient is here for annual Medicare wellness examination and management of other chronic and acute problems.  Had a GI illness recently which has resolved.  No nausea, vomiting or bowel issues currently except constipation.    The risk factors are reflected in the social history.  The roster of all physicians providing medical care to patient - is listed in the Snapshot section of the chart.  Activities of daily living:  The patient is 100% independent in all ADLs: dressing, toileting, feeding as well as independent mobility  Home safety : The patient has smoke detectors in the home. They wear seatbelts.  There are no firearms at home. There is no violence in the home.   There is no risks for hepatitis, STDs or HIV. There is no   history of blood transfusion. They have no travel history to infectious disease endemic areas of the world.  The patient has seen their dentist in the last six month. They have seen their eye doctor in the last year. They admit to slight hearing difficulty with regard to whispered voices and some television programs.  They have deferred audiologic testing in the last year.  They do not  have excessive sun exposure. Discussed the need for sun protection: hats, long sleeves and use of sunscreen if there is significant sun exposure.   Diet: the importance of a healthy diet is discussed. They do have a healthy diet.  The benefits of regular aerobic exercise were discussed. She walks 4 times per week ,  20 minutes.   Depression screen: there are no signs or vegative symptoms of depression- irritability, change in appetite, anhedonia, sadness/tearfullness.  Cognitive assessment: the patient manages all their financial and personal affairs and is actively engaged. They could relate day,date,year and events; recalled 2/3 objects at 3 minutes; performed clock-face test normally.  The  following portions of the patient's history were reviewed and updated as appropriate: allergies, current medications, past family history, past medical history,  past surgical history, past social history  and problem list.  Visual acuity was not assessed per patient preference since she has regular follow up with her ophthalmologist. Hearing and body mass index were assessed and reviewed.   During the course of the visit the patient was educated and counseled about appropriate screening and preventive services including : fall prevention , diabetes screening, nutrition counseling, colorectal cancer screening, and recommended immunizations.    Objective:  BP 136/68  Pulse 79  Temp(Src) 97.7 F (36.5 C) (Oral)  Resp 16  Ht 5' 7.25" (1.708 m)  Wt 169 lb 12 oz (76.998 kg)  BMI 26.39 kg/m2  SpO2 98%  General appearance: alert, cooperative and appears stated age Head: Normocephalic, without obvious abnormality, atraumatic Eyes: conjunctivae/corneas clear. PERRL, EOM's intact. Fundi benign. Ears: normal TM's and external ear canals both ears Nose: Nares normal. Septum midline. Mucosa normal. No drainage or sinus tenderness. Throat: lips, mucosa, and tongue normal; teeth and gums normal Neck: no adenopathy, no carotid bruit, no JVD, supple, symmetrical, trachea midline and thyroid not enlarged, symmetric, no tenderness/mass/nodules Lungs: clear to auscultation bilaterally Breasts: right mastectomy scar well healed,  Left breast normal appearance, no masses or tenderness Heart: regular rate and rhythm, S1, S2 normal, no murmur, click, rub or gallop Abdomen: soft, non-tender; bowel sounds normal; no masses,  no organomegaly Extremities: extremities normal, atraumatic, no cyanosis or edema Pulses: 2+ and symmetric Skin:  Skin color, texture, turgor normal. No rashes or lesions Neurologic: Alert and oriented X 3, normal strength and tone. Normal symmetric reflexes. Normal coordination and gait.    Assessment and Plan:  Osteopenia due to cancer therapy She is due for repeat DEXA scan this year   Screening for cervical cancer Discussed the new guidelines.  Will repeat pelvic and PAP next year given history of CA treatment   Routine general medical examination at a health care facility Annual Medicare Wellness exam and comprehensive exam was done including breast, excluding pelvic and PAP smear. All screenings have been addressed and a printed health maintenance schedule was given to patient.    Personal history of breast cancer She has been released by Oncology .  Annual mammogram and breast exams to be done by me with annual unilateral diagnostic mammogram of left breast  Done and normal at [facility] in May     Updated Medication List Outpatient Encounter Prescriptions as of 12/28/2013  Medication Sig  . aspirin 81 MG tablet Take 81 mg by mouth daily.    Marland Kitchen esomeprazole (NEXIUM) 40 MG capsule Take 1 capsule (40 mg total) by mouth daily before breakfast.  . hydrocortisone-pramoxine (ANALPRAM-HC) 2.5-1 % rectal cream Apply topicalyl twice daily as needed  . Multiple Vitamin (MULTIVITAMIN) tablet Take 1 tablet by mouth daily.    . simvastatin (ZOCOR) 40 MG tablet Take 1 tablet (40 mg total) by mouth every evening.  . hydrocortisone 2.5 % cream Apply topically 2 (two) times daily.  . [DISCONTINUED] triamcinolone cream (KENALOG) 0.1 % Apply topically 2 (two) times daily.

## 2013-12-28 NOTE — Progress Notes (Signed)
Pre-visit discussion using our clinic review tool. No additional management support is needed unless otherwise documented below in the visit note.  

## 2013-12-28 NOTE — Patient Instructions (Addendum)
You had your annual Medicare wellness exam today  You received the pneumonia vaccine today.  Another good source of fiber are Quest bars ([employer]itamin shoppe locally and amazon.com)  Your symptoms sound like IBS/constipation  Try salt loading the next time we need labs in 6 months (the day before) by eating : potato chips,  Tuna fish,  Etc. To help your veins puff out  We can try using  [facility] the next time  Health Maintenance Adopting a healthy lifestyle and getting preventive care can go a long way to promote health and wellness. Talk with your health care provider about what schedule of regular examinations is right for you. This is a good chance for you to check in with your provider about disease prevention and staying healthy. In between checkups, there are plenty of things you can do on your own. Experts have done a lot of research about which lifestyle changes and preventive measures are most likely to keep you healthy. Ask your health care provider for more information. WEIGHT AND DIET  Eat a healthy diet  Be sure to include plenty of vegetables, fruits, low-fat dairy products, and lean protein.  Do not eat a lot of foods high in solid fats, added sugars, or salt.  Get regular exercise. This is one of the most important things you can do for your health.  Most adults should exercise for at least 150 minutes each week. The exercise should increase your heart rate and make you sweat (moderate-intensity exercise).  Most adults should also do strengthening exercises at least twice a week. This is in addition to the moderate-intensity exercise.  Maintain a healthy weight  Body mass index (BMI) is a measurement that can be used to identify possible weight problems. It estimates body fat based on height and weight. Your health care provider can help determine your BMI and help you achieve or maintain a healthy weight.  For females 49 years of age and older:   A BMI below 18.5 is  considered underweight.  A BMI of 18.5 to 24.9 is normal.  A BMI of 25 to 29.9 is considered overweight.  A BMI of 30 and above is considered obese.  Watch levels of cholesterol and blood lipids  You should start having your blood tested for lipids and cholesterol at 72 years of age, then have this test every 5 years.  You may need to have your cholesterol levels checked more often if:  Your lipid or cholesterol levels are high.  You are older than 72 years of age.  You are at high risk for heart disease.  CANCER SCREENING   Lung Cancer  Lung cancer screening is recommended for adults 83-66 years old who are at high risk for lung cancer because of a history of smoking.  A yearly low-dose CT scan of the lungs is recommended for people who:  Currently smoke.  Have quit within the past 15 years.  Have at least a 30-pack-year history of smoking. A pack year is smoking an average of one pack of cigarettes a day for 1 year.  Yearly screening should continue until it has been 15 years since you quit.  Yearly screening should stop if you develop a health problem that would prevent you from having lung cancer treatment.  Breast Cancer  Practice breast self-awareness. This means understanding how your breasts normally appear and feel.  It also means doing regular breast self-exams. Let your health care provider know about any changes, no matter  how small.  If you are in your 20s or 30s, you should have a clinical breast exam (CBE) by a health care provider every 1-3 years as part of a regular health exam.  If you are 51 or older, have a CBE every year. Also consider having a breast X-ray (mammogram) every year.  If you have a family history of breast cancer, talk to your health care provider about genetic screening.  If you are at high risk for breast cancer, talk to your health care provider about having an MRI and a mammogram every year.  Breast cancer gene (BRCA)  assessment is recommended for women who have family members with BRCA-related cancers. BRCA-related cancers include:  Breast.  Ovarian.  Tubal.  Peritoneal cancers.  Results of the assessment will determine the need for genetic counseling and BRCA1 and BRCA2 testing. Cervical Cancer Routine pelvic examinations to screen for cervical cancer are no longer recommended for nonpregnant women who are considered low risk for cancer of the pelvic organs (ovaries, uterus, and vagina) and who do not have symptoms. A pelvic examination may be necessary if you have symptoms including those associated with pelvic infections. Ask your health care provider if a screening pelvic exam is right for you.   The Pap test is the screening test for cervical cancer for women who are considered at risk.  If you had a hysterectomy for a problem that was not cancer or a condition that could lead to cancer, then you no longer need Pap tests.  If you are older than 65 years, and you have had normal Pap tests for the past 10 years, you no longer need to have Pap tests.  If you have had past treatment for cervical cancer or a condition that could lead to cancer, you need Pap tests and screening for cancer for at least 20 years after your treatment.  If you no longer get a Pap test, assess your risk factors if they change (such as having a new sexual partner). This can affect whether you should start being screened again.  Some women have medical problems that increase their chance of getting cervical cancer. If this is the case for you, your health care provider may recommend more frequent screening and Pap tests.  The human papillomavirus (HPV) test is another test that may be used for cervical cancer screening. The HPV test looks for the virus that can cause cell changes in the cervix. The cells collected during the Pap test can be tested for HPV.  The HPV test can be used to screen women 84 years of age and older.  Getting tested for HPV can extend the interval between normal Pap tests from three to five years.  An HPV test also should be used to screen women of any age who have unclear Pap test results.  After 72 years of age, women should have HPV testing as often as Pap tests.  Colorectal Cancer  This type of cancer can be detected and often prevented.  Routine colorectal cancer screening usually begins at 72 years of age and continues through 72 years of age.  Your health care provider may recommend screening at an earlier age if you have risk factors for colon cancer.  Your health care provider may also recommend using home test kits to check for hidden blood in the stool.  A small camera at the end of a tube can be used to examine your colon directly (sigmoidoscopy or colonoscopy). This is done  to check for the earliest forms of colorectal cancer.  Routine screening usually begins at age 50.  Direct examination of the colon should be repeated every 5-10 years through 72 years of age. However, you may need to be screened more often if early forms of precancerous polyps or small growths are found. Skin Cancer  Check your skin from head to toe regularly.  Tell your health care provider about any new moles or changes in moles, especially if there is a change in a mole's shape or color.  Also tell your health care provider if you have a mole that is larger than the size of a pencil eraser.  Always use sunscreen. Apply sunscreen liberally and repeatedly throughout the day.  Protect yourself by wearing long sleeves, pants, a wide-brimmed hat, and sunglasses whenever you are outside. HEART DISEASE, DIABETES, AND HIGH BLOOD PRESSURE   Have your blood pressure checked at least every 1-2 years. High blood pressure causes heart disease and increases the risk of stroke.  If you are between 55 years and 79 years old, ask your health care provider if you should take aspirin to prevent  strokes.  Have regular diabetes screenings. This involves taking a blood sample to check your fasting blood sugar level.  If you are at a normal weight and have a low risk for diabetes, have this test once every three years after 72 years of age.  If you are overweight and have a high risk for diabetes, consider being tested at a younger age or more often. PREVENTING INFECTION  Hepatitis B  If you have a higher risk for hepatitis B, you should be screened for this virus. You are considered at high risk for hepatitis B if:  You were born in a country where hepatitis B is common. Ask your health care provider which countries are considered high risk.  Your parents were born in a high-risk country, and you have not been immunized against hepatitis B (hepatitis B vaccine).  You have HIV or AIDS.  You use needles to inject street drugs.  You live with someone who has hepatitis B.  You have had sex with someone who has hepatitis B.  You get hemodialysis treatment.  You take certain medicines for conditions, including cancer, organ transplantation, and autoimmune conditions. Hepatitis C  Blood testing is recommended for:  Everyone born from 1945 through 1965.  Anyone with known risk factors for hepatitis C. Sexually transmitted infections (STIs)  You should be screened for sexually transmitted infections (STIs) including gonorrhea and chlamydia if:  You are sexually active and are younger than 72 years of age.  You are older than 72 years of age and your health care provider tells you that you are at risk for this type of infection.  Your sexual activity has changed since you were last screened and you are at an increased risk for chlamydia or gonorrhea. Ask your health care provider if you are at risk.  If you do not have HIV, but are at risk, it may be recommended that you take a prescription medicine daily to prevent HIV infection. This is called pre-exposure prophylaxis  (PrEP). You are considered at risk if:  You are sexually active and do not regularly use condoms or know the HIV status of your partner(s).  You take drugs by injection.  You are sexually active with a partner who has HIV. Talk with your health care provider about whether you are at high risk of being infected with   HIV. If you choose to begin PrEP, you should first be tested for HIV. You should then be tested every 3 months for as long as you are taking PrEP.  PREGNANCY   If you are premenopausal and you may become pregnant, ask your health care provider about preconception counseling.  If you may become pregnant, take 400 to 800 micrograms (mcg) of folic acid every day.  If you want to prevent pregnancy, talk to your health care provider about birth control (contraception). OSTEOPOROSIS AND MENOPAUSE   Osteoporosis is a disease in which the bones lose minerals and strength with aging. This can result in serious bone fractures. Your risk for osteoporosis can be identified using a bone density scan.  If you are 22 years of age or older, or if you are at risk for osteoporosis and fractures, ask your health care provider if you should be screened.  Ask your health care provider whether you should take a calcium or vitamin D supplement to lower your risk for osteoporosis.  Menopause may have certain physical symptoms and risks.  Hormone replacement therapy may reduce some of these symptoms and risks. Talk to your health care provider about whether hormone replacement therapy is right for you.  HOME CARE INSTRUCTIONS   Schedule regular health, dental, and eye exams.  Stay current with your immunizations.   Do not use any tobacco products including cigarettes, chewing tobacco, or electronic cigarettes.  If you are pregnant, do not drink alcohol.  If you are breastfeeding, limit how much and how often you drink alcohol.  Limit alcohol intake to no more than 1 drink per day for  nonpregnant women. One drink equals 12 ounces of beer, 5 ounces of wine, or 1 ounces of hard liquor.  Do not use street drugs.  Do not share needles.  Ask your health care provider for help if you need support or information about quitting drugs.  Tell your health care provider if you often feel depressed.  Tell your health care provider if you have ever been abused or do not feel safe at home. Document Released: 11/02/2010 Document Revised: 09/03/2013 Document Reviewed: 03/21/2013 [facility] Patient Information 2015 [place], [place]. This information is not intended to replace advice given to you by your health care provider. Make sure you discuss any questions you have with your health care provider.

## 2013-12-30 NOTE — Assessment & Plan Note (Signed)
Annual Medicare Wellness exam and comprehensive exam was done including breast, excluding pelvic and PAP smear. All screenings have been addressed and a printed health maintenance schedule was given to patient.

## 2013-12-30 NOTE — Assessment & Plan Note (Addendum)
She has been released by Oncology .  Annual mammogram and breast exams to be done by me with annual unilateral diagnostic mammogram of left breast  Done and normal at [facility] in May

## 2013-12-30 NOTE — Assessment & Plan Note (Signed)
Discussed the new guidelines.  Will repeat pelvic and PAP next year given history of CA treatment

## 2013-12-30 NOTE — Assessment & Plan Note (Signed)
She is due for repeat DEXA scan this year

## 2014-01-08 ENCOUNTER — Telehealth: Payer: Self-pay | Admitting: *Deleted

## 2014-01-08 MED ORDER — HYDROCORTISONE ACE-PRAMOXINE 2.5-1 % EX CREA: TOPICAL_CREAM | CUTANEOUS | Status: DC

## 2014-01-08 NOTE — Telephone Encounter (Signed)
Pt left VM, stating she was seen last week. Rx was sent in for hydrocortisone. Pt states it should have been sent as hydrocortisone-pramoxine 2%?, this is what [place] is recommending. Ok?

## 2014-01-08 NOTE — Telephone Encounter (Signed)
Left message, notifying Rx sent to pharmacy

## 2014-01-08 NOTE — Telephone Encounter (Signed)
Yes that's fine 

## 2014-01-17 ENCOUNTER — Telehealth: Payer: Self-pay | Admitting: *Deleted

## 2014-01-17 NOTE — Telephone Encounter (Signed)
Received fax from pharmacy, needing PA on Pramosone. Started PA online, approved by [employer].

## 2014-03-22 ENCOUNTER — Ambulatory Visit: Payer: BC Managed Care – PPO | Admitting: Nurse Practitioner

## 2014-04-01 ENCOUNTER — Ambulatory Visit (INDEPENDENT_AMBULATORY_CARE_PROVIDER_SITE_OTHER): Payer: Medicare Other | Admitting: Nurse Practitioner

## 2014-04-01 ENCOUNTER — Encounter (INDEPENDENT_AMBULATORY_CARE_PROVIDER_SITE_OTHER): Payer: Self-pay

## 2014-04-01 ENCOUNTER — Encounter: Payer: Self-pay | Admitting: Nurse Practitioner

## 2014-04-01 ENCOUNTER — Ambulatory Visit (INDEPENDENT_AMBULATORY_CARE_PROVIDER_SITE_OTHER)
Admission: RE | Admit: 2014-04-01 | Discharge: 2014-04-01 | Disposition: A | Discharge status: Home / Self Care | Payer: Medicare Other | Source: Ambulatory Visit | Attending: Nurse Practitioner | Admitting: Nurse Practitioner

## 2014-04-01 VITALS — BP 126/80 | HR 84 | Temp 97.9°F | Resp 14 | Wt 171.5 lb

## 2014-04-01 DIAGNOSIS — R059 Cough, unspecified: Secondary | ICD-10-CM | POA: Insufficient documentation

## 2014-04-01 DIAGNOSIS — R05 Cough: Secondary | ICD-10-CM | POA: Diagnosis not present

## 2014-04-01 DIAGNOSIS — K219 Gastro-esophageal reflux disease without esophagitis: Secondary | ICD-10-CM | POA: Diagnosis not present

## 2014-04-01 NOTE — Progress Notes (Signed)
Subjective:    Patient ID: Lori Caldwell, female    DOB: July 08, 1940, 73 y.o.   MRN: [id number]  HPI Lori Caldwell is a 73 yo female with a chief complaint of cough x 1 month.   Onset 03/04/2014. Began feeling better last Friday (03/29/14).  Symptoms began with nasal congestion, sore throat, and coughing non-productive.  Currently: cough non-productive, worse at night, PNDrip, and  White and thick nasal discharge.  Treatment to date- Coricidin- helpful , benadryl- 1 in am and 1 in pm- helpful , Delsym- loose stools each time separate occasions  Bourbon and Honey- for 3 days- helped  Afrin- tried last night- helpful  No recent Chest X-rays  Nexium- took over the past two days. Stopped while sick, but started back when felt better.   BP 126/80 mmHg  Pulse 84  Temp(Src) 97.9 F (36.6 C) (Oral)  Resp 14  Wt 171 lb 8 oz (77.792 kg)  SpO2 97%   Review of Systems  Constitutional: Negative for fever, chills, diaphoresis, fatigue and unexpected weight change.  HENT: Positive for postnasal drip, rhinorrhea and sneezing. Negative for ear pain and sinus pressure.   Eyes: Negative for itching and visual disturbance.  Respiratory: Negative for chest tightness, shortness of breath and wheezing.   Cardiovascular: Negative for chest pain and palpitations.  Gastrointestinal: Negative for nausea, vomiting and diarrhea.  Musculoskeletal: Negative for myalgias.  Skin: Negative for rash.  Neurological: Negative for dizziness and headaches.   Past Medical History  Diagnosis Date  . breast cancer   . Hyperlipidemia   . GERD (gastroesophageal reflux disease)     History   Social History  . Marital Status: Married    Spouse Name: N/A    Number of Children: N/A  . Years of Education: N/A   Occupational History  . Not on file.   Social History Main Topics  . Smoking status: Never Smoker   . Smokeless tobacco: Never Used  . Alcohol Use: Yes  . Drug Use: No  . Sexual Activity: Not on file    Other Topics Concern  . Not on file   Social History Narrative    Past Surgical History  Procedure Laterality Date  . Breast surgery      mastectomy, left breast     Family History  Problem Relation Age of Onset  . Hypertension Mother   . Heart disease Father   . Cancer Maternal Grandmother     No Known Allergies  Current Outpatient Prescriptions on File Prior to Visit  Medication Sig Dispense Refill  . aspirin 81 MG tablet Take 81 mg by mouth daily.      Marland Kitchen esomeprazole (NEXIUM) 40 MG capsule Take 1 capsule (40 mg total) by mouth daily before breakfast. 30 capsule 11  . hydrocortisone 2.5 % cream Apply topically 2 (two) times daily. 30 g 2  . hydrocortisone-pramoxine (ANALPRAM-HC) 2.5-1 % rectal cream Apply topicalyl twice daily as needed 30 g 0  . Multiple Vitamin (MULTIVITAMIN) tablet Take 1 tablet by mouth daily.      . Pramoxine-HC (HYDROCORTISONE ACE-PRAMOXINE) 2.5-1 % CREA Apply topically 2 (two) times daily. 30 g 0  . simvastatin (ZOCOR) 40 MG tablet Take 1 tablet (40 mg total) by mouth every evening. 30 tablet 5   No current facility-administered medications on file prior to visit.         Objective:   Physical Exam  Constitutional: She is oriented to person, place, and time. She appears well-developed and  well-nourished. No distress.  HENT:  Head: Normocephalic and atraumatic.  Right Ear: External ear normal.  Left Ear: External ear normal.  Mouth/Throat: Oropharynx is clear and moist. No oropharyngeal exudate.  TM's visible and w/out abnormalities.   Eyes: Conjunctivae and EOM are normal. Pupils are equal, round, and reactive to light. Right eye exhibits no discharge. Left eye exhibits no discharge. No scleral icterus.  Eyes are red bilaterally.   Neck: Normal range of motion. Neck supple. No thyromegaly present.  Cardiovascular: Normal rate, regular rhythm and normal heart sounds.  Exam reveals no gallop and no friction rub.   No murmur  heard. Pulmonary/Chest: Effort normal and breath sounds normal. No respiratory distress. She has no wheezes. She has no rales. She exhibits no tenderness.  Lymphadenopathy:    She has no cervical adenopathy.  Neurological: She is alert and oriented to person, place, and time.  Skin: Skin is warm and dry. No rash noted. She is not diaphoretic.  Psychiatric: She has a normal mood and affect. Her behavior is normal. Judgment and thought content normal.          Assessment & Plan:

## 2014-04-01 NOTE — Progress Notes (Signed)
Pre visit review using our clinic review tool, if applicable. No additional management support is needed unless otherwise documented below in the visit note. 

## 2014-04-01 NOTE — Assessment & Plan Note (Signed)
Discussed the possible etiologies of persistent cough including but not limited to post nasal drip,  Allergic rhinitis,  Asthma, GERD and chronic cyclic cough due to persistent irritation.  She is not taking and ACE Inhibitor.  Suggested treating for allergic rhinitis, PNDrip and GERD to break cycle. Obtain chest X-ray to rule out other causes of cough. She was instructed to call if worsening or symptoms persist.

## 2014-04-01 NOTE — Assessment & Plan Note (Signed)
Stable. Pt began taking the Nexium again after feeling poorly with a cough/congestion. She has currently taken for 2 days and advised to take each day.

## 2014-04-01 NOTE — Patient Instructions (Signed)
Your cough may be coming from reflux, allergies, or post nasal drip (PND).  PND and allergies can be treated with benadryl,  Allegra, Zyrtec and claritin. Benadryl is the most effective for drying you up but it is also the most sedating,  So try taking it at night and use one of the others in the daytime  Add Sudafed PE as needed for congestion  "DM" stands for dextromethorphan which is a cough suppressant.  The best/strongest available OTC cough suppressant is Delsym.  If the cough does not improve  With these measures please call us to re-evaluate.   Consider using simply Saline to flush your sinuses twice daily when you have congestion to prevent sinus infections   Use Afrin infrequently (3 x or less in period of time) in order to keep from having irreversible nasal congestion.

## 2014-05-01 ENCOUNTER — Encounter: Payer: Self-pay | Admitting: *Deleted

## 2014-05-13 DIAGNOSIS — J309 Allergic rhinitis, unspecified: Secondary | ICD-10-CM | POA: Diagnosis not present

## 2014-05-13 DIAGNOSIS — H6123 Impacted cerumen, bilateral: Secondary | ICD-10-CM | POA: Diagnosis not present

## 2014-05-15 NOTE — Telephone Encounter (Signed)
Mailed unread message to pt  

## 2014-05-18 ENCOUNTER — Encounter: Payer: Self-pay | Admitting: Internal Medicine

## 2014-05-22 ENCOUNTER — Ambulatory Visit: Payer: BC Managed Care – PPO | Admitting: Nurse Practitioner

## 2014-06-10 ENCOUNTER — Other Ambulatory Visit: Payer: Self-pay | Admitting: *Deleted

## 2014-06-10 MED ORDER — SIMVASTATIN 40 MG PO TABS: 40.0000 mg | ORAL_TABLET | Freq: Every evening | ORAL | Status: DC

## 2014-06-11 DIAGNOSIS — H21511 Anterior synechiae (iris), right eye: Secondary | ICD-10-CM | POA: Diagnosis not present

## 2014-06-11 DIAGNOSIS — H524 Presbyopia: Secondary | ICD-10-CM | POA: Diagnosis not present

## 2014-06-11 DIAGNOSIS — H04123 Dry eye syndrome of bilateral lacrimal glands: Secondary | ICD-10-CM | POA: Diagnosis not present

## 2014-07-03 ENCOUNTER — Telehealth: Payer: Self-pay

## 2014-07-03 DIAGNOSIS — Z79899 Other long term (current) drug therapy: Secondary | ICD-10-CM

## 2014-07-03 NOTE — Telephone Encounter (Signed)
The patient is hoping to get advice on when and where her blood work should be done for her cpe in august.  She states she believes she is suppose to be going to a different location to get blood work done, but is unsure of where.

## 2014-07-03 NOTE — Telephone Encounter (Signed)
Yes now,  Because she takes simvastastin, she needs labs every 6 months does not need to fast

## 2014-07-03 NOTE — Telephone Encounter (Signed)
Needs nonfasting labs now bc of simvastatin use

## 2014-07-03 NOTE — Telephone Encounter (Signed)
Left message for patient to return call to office. 

## 2014-07-03 NOTE — Telephone Encounter (Signed)
Patient last labs were 12/24/13 please advise lab results say repeat in 6 months patient should come in for labs now wait until CPE in August?

## 2014-07-10 NOTE — Telephone Encounter (Signed)
Patient notified needs lab appointment.

## 2014-07-25 DIAGNOSIS — Z85828 Personal history of other malignant neoplasm of skin: Secondary | ICD-10-CM | POA: Diagnosis not present

## 2014-07-25 DIAGNOSIS — L821 Other seborrheic keratosis: Secondary | ICD-10-CM | POA: Diagnosis not present

## 2014-08-20 ENCOUNTER — Other Ambulatory Visit (INDEPENDENT_AMBULATORY_CARE_PROVIDER_SITE_OTHER): Payer: Medicare Other

## 2014-08-20 DIAGNOSIS — Z79899 Other long term (current) drug therapy: Secondary | ICD-10-CM | POA: Diagnosis not present

## 2014-08-20 LAB — COMPREHENSIVE METABOLIC PANEL
ALK PHOS: 51 U/L (ref 39–117)
ALT: 23 U/L (ref 0–35)
AST: 26 U/L (ref 0–37)
Albumin: 4.2 g/dL (ref 3.5–5.2)
BILIRUBIN TOTAL: 0.6 mg/dL (ref 0.2–1.2)
BUN: 11 mg/dL (ref 6–23)
CO2: 28 mEq/L (ref 19–32)
Calcium: 9.6 mg/dL (ref 8.4–10.5)
Chloride: 105 mEq/L (ref 96–112)
Creatinine, Ser: 0.77 mg/dL (ref 0.40–1.20)
GFR: 78.13 mL/min (ref >60.00)
Glucose, Bld: 83 mg/dL (ref 70–99)
Potassium: 4 mEq/L (ref 3.5–5.1)
SODIUM: 138 meq/L (ref 135–145)
Total Protein: 7.1 g/dL (ref 6.0–8.3)

## 2014-08-22 ENCOUNTER — Encounter: Payer: Self-pay | Admitting: Internal Medicine

## 2014-08-29 ENCOUNTER — Other Ambulatory Visit: Payer: BC Managed Care – PPO

## 2014-09-09 ENCOUNTER — Other Ambulatory Visit: Payer: Self-pay | Admitting: *Deleted

## 2014-09-09 MED ORDER — ESOMEPRAZOLE MAGNESIUM 40 MG PO CPDR
40.0000 mg | DELAYED_RELEASE_CAPSULE | Freq: Every day | ORAL | Status: DC
Start: 2014-09-09 — End: 2016-11-01

## 2014-09-13 NOTE — Telephone Encounter (Signed)
Mailed patient letter with Lori Caldwell

## 2014-10-06 ENCOUNTER — Ambulatory Visit
Admission: EM | Admit: 2014-10-06 | Discharge: 2014-10-06 | Disposition: A | Discharge status: Home / Self Care | Payer: Medicare Other | Attending: Family Medicine | Admitting: Family Medicine

## 2014-10-06 ENCOUNTER — Encounter: Payer: Self-pay | Admitting: Gynecology

## 2014-10-06 DIAGNOSIS — L237 Allergic contact dermatitis due to plants, except food: Secondary | ICD-10-CM | POA: Diagnosis not present

## 2014-10-06 DIAGNOSIS — L03113 Cellulitis of right upper limb: Secondary | ICD-10-CM

## 2014-10-06 MED ORDER — PREDNISONE 20 MG PO TABS: 40.0000 mg | ORAL_TABLET | Freq: Every day | ORAL | Status: DC

## 2014-10-06 MED ORDER — PREDNISONE 5 MG PO TABS: 5.0000 mg | ORAL_TABLET | Freq: Every day | ORAL | Status: DC

## 2014-10-06 MED ORDER — SULFAMETHOXAZOLE-TRIMETHOPRIM 800-160 MG PO TABS: 1.0000 | ORAL_TABLET | Freq: Two times a day (BID) | ORAL | Status: DC

## 2014-10-06 NOTE — Discharge Instructions (Signed)
Poison [employer] ivy is a inflammation of the skin (contact dermatitis) caused by touching the allergens on the leaves of the ivy plant following previous exposure to the plant. The rash usually appears 48 hours after exposure. The rash is usually bumps (papules) or blisters (vesicles) in a linear pattern. Depending on your own sensitivity, the rash may simply cause redness and itching, or it may also progress to blisters which may break open. These must be well cared for to prevent secondary bacterial (germ) infection, followed by scarring. Keep any open areas dry, clean, dressed, and covered with an antibacterial ointment if needed. The eyes may also get puffy. The puffiness is worst in the morning and gets better as the day progresses. This dermatitis usually heals without scarring, within 2 to 3 weeks without treatment. HOME CARE INSTRUCTIONS  Thoroughly wash with soap and water as soon as you have been exposed to poison ivy. You have about one half hour to remove the plant resin before it will cause the rash. This washing will destroy the oil or antigen on the skin that is causing, or will cause, the rash. Be sure to wash under your fingernails as any plant resin there will continue to spread the rash. Do not rub skin vigorously when washing affected area. Poison ivy cannot spread if no oil from the plant remains on your body. A rash that has progressed to weeping sores will not spread the rash unless you have not washed thoroughly. It is also important to wash any clothes you have been wearing as these may carry active allergens. The rash will return if you wear the unwashed clothing, even several days later. Avoidance of the plant in the future is the best measure. Poison ivy plant can be recognized by the number of leaves. Generally, poison ivy has three leaves with flowering branches on a single stem. Diphenhydramine may be purchased over the counter and used as needed for itching. Do not drive with  this medication if it makes you drowsy.Ask your caregiver about medication for children. SEEK MEDICAL CARE IF:  Open sores develop.  Redness spreads beyond area of rash.  You notice purulent (pus-like) discharge.  You have increased pain.  Other signs of infection develop (such as fever). Document Released: 04/16/2000 Document Revised: 07/12/2011 Document Reviewed: 09/27/2008 [facility] Patient Information 2015 [place], [place]. This information is not intended to replace advice given to you by your health care provider. Make sure you discuss any questions you have with your health care provider. Cellulitis Cellulitis is an infection of the skin and the tissue beneath it. The infected area is usually red and tender. Cellulitis occurs most often in the arms and lower legs.  CAUSES  Cellulitis is caused by bacteria that enter the skin through cracks or cuts in the skin. The most common types of bacteria that cause cellulitis are staphylococci and streptococci. SIGNS AND SYMPTOMS   Redness and warmth.  Swelling.  Tenderness or pain.  Fever. DIAGNOSIS  Your health care provider can usually determine what is wrong based on a physical exam. Blood tests may also be done. TREATMENT  Treatment usually involves taking an antibiotic medicine. HOME CARE INSTRUCTIONS   Take your antibiotic medicine as directed by your health care provider. Finish the antibiotic even if you start to feel better.  Keep the infected arm or leg elevated to reduce swelling.  Apply a warm cloth to the affected area up to 4 times per day to relieve pain.  Take medicines  only as directed by your health care provider.  Keep all follow-up visits as directed by your health care provider. SEEK MEDICAL CARE IF:   You notice red streaks coming from the infected area.  Your red area gets larger or turns dark in color.  Your bone or joint underneath the infected area becomes painful after the skin has healed.  Your  infection returns in the same area or another area.  You notice a swollen bump in the infected area.  You develop new symptoms.  You have a fever. SEEK IMMEDIATE MEDICAL CARE IF:   You feel very sleepy.  You develop vomiting or diarrhea.  You have a general ill feeling (malaise) with muscle aches and pains. MAKE SURE YOU:   Understand these instructions.  Will watch your condition.  Will get help right away if you are not doing well or get worse. Document Released: 01/27/2005 Document Revised: 09/03/2013 Document Reviewed: 07/05/2011 [facility] Patient Information 2015 [place], [place]. This information is not intended to replace advice given to you by your health care provider. Make sure you discuss any questions you have with your health care provider.

## 2014-10-06 NOTE — ED Notes (Signed)
Patient c/o working in her garden x 5 days when contact poison Ivy.

## 2014-10-06 NOTE — ED Provider Notes (Signed)
CSN: [id number]     Arrival date & time 10/06/14  1021 History   First MD Initiated Contact with Patient 10/06/14 1116     Chief Complaint  Patient presents with  . Poison Ivy   (Consider location/radiation/quality/duration/timing/severity/associated sxs/prior Treatment) HPI Comments: Caucasian female with known contact poison ivy in her yard--accidental during gardening washed area but started on forearms and has spread to upper arms.  Right arm swollen, hot to touch and more painful than left and that is why patient here today.  Pain into her axilla.  Has tried hydrocortisone 2.5% topical cream with little relief--mainly helped itching.  Ice and shower tepid helped with itching the most but not redness or spread.  Wore gloves to perform yard work.  Patient is a 74 y.o. female presenting with poison ivy. The history is provided by the patient.  Poison Karlene Einstein This is a new problem. The current episode started more than 2 days ago. The problem occurs constantly. The problem has been gradually worsening. Pertinent negatives include no chest pain, no abdominal pain, no headaches and no shortness of breath. The symptoms are aggravated by exertion. Nothing relieves the symptoms. She has tried a cold compress, water, food and rest for the symptoms. The treatment provided no relief.    Past Medical History  Diagnosis Date  . breast cancer   . Hyperlipidemia   . GERD (gastroesophageal reflux disease)    Past Surgical History  Procedure Laterality Date  . Breast surgery      mastectomy, left breast    Family History  Problem Relation Age of Onset  . Hypertension Mother   . Heart disease Father   . Cancer Maternal Grandmother    History  Substance Use Topics  . Smoking status: Never Smoker   . Smokeless tobacco: Never Used  . Alcohol Use: Yes   OB History    No data available     Review of Systems  Constitutional: Negative for fever, chills, diaphoresis, activity change, appetite change  and fatigue.  HENT: Negative for congestion, ear discharge, ear pain, facial swelling, mouth sores, nosebleeds, sore throat, trouble swallowing and voice change.   Eyes: Negative for photophobia, pain, discharge, redness, itching and visual disturbance.  Respiratory: Negative for apnea, cough, choking, chest tightness, shortness of breath, wheezing and stridor.   Cardiovascular: Negative for chest pain, palpitations and leg swelling.  Gastrointestinal: Negative for nausea, vomiting, abdominal pain, diarrhea, constipation and blood in stool.  Endocrine: Negative for cold intolerance and heat intolerance.  Genitourinary: Negative for hematuria, flank pain and difficulty urinating.  Musculoskeletal: Positive for myalgias. Negative for back pain, joint swelling, arthralgias, gait problem, neck pain and neck stiffness.  Skin: Positive for color change and rash. Negative for pallor and wound.  Allergic/Immunologic: Positive for environmental allergies. Negative for food allergies.  Neurological: Negative for dizziness, tremors, seizures, syncope, facial asymmetry, speech difficulty, weakness, light-headedness, numbness and headaches.  Hematological: Negative for adenopathy. Does not bruise/bleed easily.  Psychiatric/Behavioral: Negative for behavioral problems, confusion, sleep disturbance and agitation.    Allergies  Review of patient's allergies indicates no known allergies.  Home Medications   Prior to Admission medications   Medication Sig Start Date End Date Taking? Authorizing Provider  aspirin 81 MG tablet Take 81 mg by mouth daily.     Yes Historical Provider, MD  esomeprazole (NEXIUM) 40 MG capsule Take 1 capsule (40 mg total) by mouth daily before breakfast. 09/09/14  Yes Crecencio Mc, MD  hydrocortisone 2.5 % cream  Apply topically 2 (two) times daily. 12/28/13  Yes Crecencio Mc, MD  hydrocortisone-pramoxine [facility]) 2.5-1 % rectal cream Apply topicalyl twice daily as needed  04/13/11  Yes Crecencio Mc, MD  Multiple Vitamin (MULTIVITAMIN) tablet Take 1 tablet by mouth daily.     Yes Historical Provider, MD  simvastatin (ZOCOR) 40 MG tablet Take 1 tablet (40 mg total) by mouth every evening. 06/10/14  Yes Crecencio Mc, MD  Pramoxine-HC (HYDROCORTISONE ACE-PRAMOXINE) 2.5-1 % CREA Apply topically 2 (two) times daily. 01/08/14   Crecencio Mc, MD  predniSONE (DELTASONE) 20 MG tablet Take 2 tablets (40 mg total) by mouth daily with breakfast. For 7 days then 20mg  po daily for 7 days then 10mg  po for 4 days 10/06/14   Olen Cordial, NP  predniSONE (DELTASONE) 5 MG tablet Take 1 tablet (5 mg total) by mouth daily with breakfast. 10/06/14   Olen Cordial, NP  sulfamethoxazole-trimethoprim (BACTRIM DS,SEPTRA DS) 800-160 MG per tablet Take 1 tablet by mouth 2 (two) times daily. 10/06/14   Aura Fey Taline Nass, NP   BP 113/45 mmHg  Pulse 78  Temp(Src) 96.5 F (35.8 C) (Tympanic)  Ht 5\' 7"  (1.702 m)  Wt 170 lb (77.111 kg)  BMI 26.62 kg/m2  SpO2 100% Physical Exam  Constitutional: She is oriented to person, place, and time. Vital signs are normal. She appears well-developed and well-nourished. No distress.  HENT:  Head: Normocephalic and atraumatic.  Right Ear: External ear normal.  Left Ear: External ear normal.  Nose: Nose normal.  Mouth/Throat: Oropharynx is clear and moist. No oropharyngeal exudate.  Eyes: Conjunctivae, EOM and lids are normal. Pupils are equal, round, and reactive to light. Right eye exhibits no discharge. Left eye exhibits no discharge. No scleral icterus.  Neck: Trachea normal and normal range of motion. Neck supple. No tracheal deviation present. No thyromegaly present.  Cardiovascular: Normal rate, regular rhythm, normal heart sounds and intact distal pulses.  Exam reveals no gallop and no friction rub.   No murmur heard. Pulmonary/Chest: Effort normal and breath sounds normal. No respiratory distress. She has no wheezes. She has no rales.    Abdominal: Soft. Bowel sounds are normal. She exhibits no distension and no mass. There is no tenderness. There is no rebound and no guarding.  Musculoskeletal: Normal range of motion. She exhibits edema and tenderness.  Lymphadenopathy:    She has no cervical adenopathy.  Neurological: She is alert and oriented to person, place, and time. She displays normal reflexes. Coordination normal.  Skin: Skin is warm, dry and intact. Rash noted. No abrasion, no bruising, no burn, no ecchymosis, no laceration, no lesion, no petechiae and no purpura noted. Rash is macular, vesicular and urticarial. Rash is not papular, not maculopapular, not nodular and not pustular. She is not diaphoretic. There is erythema. No cyanosis. Nails show no clubbing.     Psychiatric: She has a normal mood and affect. Her speech is normal and behavior is normal. Judgment and thought content normal. Cognition and memory are normal.  Nursing note and vitals reviewed.   ED Course  Procedures (including critical care time) Labs Review Labs Reviewed - No data to display  Imaging Review No results found.   MDM   1. Cellulitis of right upper extremity   2. Contact dermatitis due to poison ivy    Bactrim DS po BID  For cellulitis right upper arm.  Avoid scratching.  Dermatitis Widespread greater than 20% body surface area.  1-2mg /kg Prednisone (  max 60mg ) for 7-10 days and taper over next 7-10 days per Up to Date.  Symptomatic therapy suggested e.g. Calamine lotion, benadryl or OTC zyrtec po.  Warm to cool water soaks and/or oatmeal baths.  Call or return to clinic as needed if these symptoms worsen or fail to improve as anticipated especially lesions noted on eye, visual changes or visual loss.  Discussed avoidance/no contact wear of long sleeves/pants/socks/gloves and handkerchief around neck/mouth/face and use of poison ivy block cream along with tepid shower immediately after completion of yard work/playing in yard.  Keep  poison ivy block lotion and soap at home as exposure likely to occur again.  Avoid scratching lesions to prevent secondary infections.  May apply ice to itchy areas if po/topical meds not yet active systemically or wearing off prior to next dose.  Exitcare handout on contact dermatitis poison ivy  And cellulitis given to patient.  Discussed if worsening erythema, dyspnea, tongue swelling, dysphagia, fever patient to go to ER for follow up evaluation.  Patient and verbalized agreement and understanding of treatment plan and had no further questions at this time.   P2:  Avoidance and hand washing.    Olen Cordial, NP 10/06/14 1752

## 2014-10-09 ENCOUNTER — Telehealth: Payer: Self-pay | Admitting: *Deleted

## 2014-10-09 NOTE — Telephone Encounter (Signed)
Fax from pharmacy, needing PA for Esomeprazole. Started online, pending response.

## 2014-10-15 ENCOUNTER — Encounter: Payer: Self-pay | Admitting: Internal Medicine

## 2014-12-08 ENCOUNTER — Encounter: Payer: Self-pay | Admitting: Internal Medicine

## 2014-12-09 ENCOUNTER — Telehealth: Payer: Self-pay | Admitting: Internal Medicine

## 2014-12-09 ENCOUNTER — Encounter: Payer: Self-pay | Admitting: Internal Medicine

## 2014-12-09 DIAGNOSIS — Z113 Encounter for screening for infections with a predominantly sexual mode of transmission: Secondary | ICD-10-CM

## 2014-12-09 DIAGNOSIS — M8580 Other specified disorders of bone density and structure, unspecified site: Secondary | ICD-10-CM

## 2014-12-09 DIAGNOSIS — R197 Diarrhea, unspecified: Secondary | ICD-10-CM

## 2014-12-09 DIAGNOSIS — E785 Hyperlipidemia, unspecified: Secondary | ICD-10-CM

## 2014-12-09 NOTE — Telephone Encounter (Signed)
Pt is wanting to schedule lab and CPE appoint.  Registration is unable to schedule lab appoint until orders are placed.  Please enter orders.

## 2014-12-09 NOTE — Telephone Encounter (Signed)
Pt would like to sch for lab work, but there's no orders on file. Thank You!

## 2014-12-10 NOTE — Telephone Encounter (Signed)
done

## 2014-12-10 NOTE — Telephone Encounter (Signed)
Spoke with pt, transferred to scheduling for appoints.

## 2014-12-19 DIAGNOSIS — H903 Sensorineural hearing loss, bilateral: Secondary | ICD-10-CM | POA: Diagnosis not present

## 2014-12-19 DIAGNOSIS — H6121 Impacted cerumen, right ear: Secondary | ICD-10-CM | POA: Diagnosis not present

## 2015-01-02 ENCOUNTER — Other Ambulatory Visit (INDEPENDENT_AMBULATORY_CARE_PROVIDER_SITE_OTHER): Payer: Medicare Other

## 2015-01-02 DIAGNOSIS — E785 Hyperlipidemia, unspecified: Secondary | ICD-10-CM

## 2015-01-02 DIAGNOSIS — R197 Diarrhea, unspecified: Secondary | ICD-10-CM

## 2015-01-02 DIAGNOSIS — M899 Disorder of bone, unspecified: Secondary | ICD-10-CM

## 2015-01-02 DIAGNOSIS — Z113 Encounter for screening for infections with a predominantly sexual mode of transmission: Secondary | ICD-10-CM

## 2015-01-02 DIAGNOSIS — M8580 Other specified disorders of bone density and structure, unspecified site: Secondary | ICD-10-CM

## 2015-01-02 LAB — VITAMIN D 25 HYDROXY (VIT D DEFICIENCY, FRACTURES): VITD: 39.48 ng/mL (ref 30.00–100.00)

## 2015-01-02 LAB — COMPREHENSIVE METABOLIC PANEL
ALBUMIN: 4.4 g/dL (ref 3.5–5.2)
ALT: 21 U/L (ref 0–35)
AST: 26 U/L (ref 0–37)
Alkaline Phosphatase: 49 U/L (ref 39–117)
BUN: 8 mg/dL (ref 6–23)
CALCIUM: 9.8 mg/dL (ref 8.4–10.5)
CO2: 27 meq/L (ref 19–32)
CREATININE: 0.75 mg/dL (ref 0.40–1.20)
Chloride: 105 mEq/L (ref 96–112)
GFR: 80.46 mL/min (ref >60.00)
Glucose, Bld: 85 mg/dL (ref 70–99)
Potassium: 4.7 mEq/L (ref 3.5–5.1)
Sodium: 140 mEq/L (ref 135–145)
Total Bilirubin: 0.6 mg/dL (ref 0.2–1.2)
Total Protein: 7.6 g/dL (ref 6.0–8.3)

## 2015-01-02 LAB — CBC WITH DIFFERENTIAL/PLATELET
BASOS ABS: 0 10*3/uL (ref 0.0–0.1)
Basophils Relative: 0.1 % (ref 0.0–3.0)
EOS ABS: 0 10*3/uL (ref 0.0–0.7)
Eosinophils Relative: 0.5 % (ref 0.0–5.0)
HEMATOCRIT: 44.7 % (ref 36.0–46.0)
HEMOGLOBIN: 15 g/dL (ref 12.0–15.0)
LYMPHS PCT: 27 % (ref 12.0–46.0)
Lymphs Abs: 2 10*3/uL (ref 0.7–4.0)
MCHC: 33.6 g/dL (ref 30.0–36.0)
MCV: 97.1 fl (ref 78.0–100.0)
Monocytes Absolute: 0.5 10*3/uL (ref 0.1–1.0)
Monocytes Relative: 6.7 % (ref 3.0–12.0)
Neutro Abs: 5 10*3/uL (ref 1.4–7.7)
Neutrophils Relative %: 65.7 % (ref 43.0–77.0)
PLATELETS: 235 10*3/uL (ref 150.0–400.0)
RBC: 4.6 Mil/uL (ref 3.87–5.11)
RDW: 13.1 % (ref 11.5–15.5)
WBC: 7.6 10*3/uL (ref 4.0–10.5)

## 2015-01-02 LAB — LIPID PANEL
CHOLESTEROL: 177 mg/dL (ref 0–200)
HDL: 58.8 mg/dL (ref >39.00)
LDL CALC: 82 mg/dL (ref 0–99)
NonHDL: 118.08
Total CHOL/HDL Ratio: 3
Triglycerides: 179 mg/dL — ABNORMAL HIGH (ref 0.0–149.0)
VLDL: 35.8 mg/dL (ref 0.0–40.0)

## 2015-01-02 LAB — TSH: TSH: 2.95 u[IU]/mL (ref 0.35–4.50)

## 2015-01-03 ENCOUNTER — Encounter: Payer: Self-pay | Admitting: Internal Medicine

## 2015-01-03 LAB — HEPATITIS C ANTIBODY: HCV Ab: NEGATIVE

## 2015-01-03 LAB — HIV ANTIBODY (ROUTINE TESTING W REFLEX): HIV: NONREACTIVE

## 2015-01-06 ENCOUNTER — Encounter: Payer: Self-pay | Admitting: Internal Medicine

## 2015-01-14 ENCOUNTER — Ambulatory Visit: Payer: BC Managed Care – PPO | Admitting: Internal Medicine

## 2015-01-15 ENCOUNTER — Ambulatory Visit (INDEPENDENT_AMBULATORY_CARE_PROVIDER_SITE_OTHER): Payer: Medicare Other | Admitting: Internal Medicine

## 2015-01-15 ENCOUNTER — Encounter: Payer: Self-pay | Admitting: Internal Medicine

## 2015-01-15 VITALS — BP 114/78 | HR 79 | Temp 97.7°F | Resp 14 | Ht 66.75 in | Wt 165.2 lb

## 2015-01-15 DIAGNOSIS — Z1239 Encounter for other screening for malignant neoplasm of breast: Secondary | ICD-10-CM

## 2015-01-15 DIAGNOSIS — Z0000 Encounter for general adult medical examination without abnormal findings: Secondary | ICD-10-CM | POA: Diagnosis not present

## 2015-01-15 DIAGNOSIS — K219 Gastro-esophageal reflux disease without esophagitis: Secondary | ICD-10-CM | POA: Diagnosis not present

## 2015-01-15 DIAGNOSIS — Z853 Personal history of malignant neoplasm of breast: Secondary | ICD-10-CM

## 2015-01-15 DIAGNOSIS — E785 Hyperlipidemia, unspecified: Secondary | ICD-10-CM | POA: Diagnosis not present

## 2015-01-15 DIAGNOSIS — M899 Disorder of bone, unspecified: Secondary | ICD-10-CM

## 2015-01-15 DIAGNOSIS — R55 Syncope and collapse: Secondary | ICD-10-CM | POA: Diagnosis not present

## 2015-01-15 DIAGNOSIS — Z23 Encounter for immunization: Secondary | ICD-10-CM

## 2015-01-15 DIAGNOSIS — M8580 Other specified disorders of bone density and structure, unspecified site: Secondary | ICD-10-CM

## 2015-01-15 MED ORDER — MECLIZINE HCL 25 MG PO TABS: 25.0000 mg | ORAL_TABLET | Freq: Three times a day (TID) | ORAL | Status: DC | PRN

## 2015-01-15 MED ORDER — HYDROCORTISONE ACE-PRAMOXINE 2.5-1 % EX CREA: TOPICAL_CREAM | CUTANEOUS | Status: DC

## 2015-01-15 MED ORDER — DIAZEPAM 5 MG PO TABS: 5.0000 mg | ORAL_TABLET | Freq: Two times a day (BID) | ORAL | Status: DC | PRN

## 2015-01-15 NOTE — Progress Notes (Signed)
Pre-visit discussion using our clinic review tool. No additional management support is needed unless otherwise documented below in the visit note.  

## 2015-01-15 NOTE — Progress Notes (Addendum)
Patient ID: Lori Caldwell, female    DOB: 07-28-1940  Age: 74 y.o. MRN: [id number]  The patient is here for annual Medicare wellness examination and management of other chronic and acute problems.  The risk factors are reflected in the social history.  The roster of all physicians providing medical care to patient - is listed in the Snapshot section of the chart.  Activities of daily living:  The patient is 100% independent in all ADLs: dressing, toileting, feeding as well as independent mobility  Home safety : The patient has smoke detectors in the home. They wear seatbelts.  There are no firearms at home. There is no violence in the home.   There is no risks for hepatitis, STDs or HIV. There is no   history of blood transfusion. They have no travel history to infectious disease endemic areas of the world.  The patient has seen their dentist in the last six month. They have seen their eye doctor in the last year. They admit to slight hearing difficulty with regard to whispered voices and some television programs.  They have had d audiologic testing in the last year.  The right ear is worse , per chap.  They do not  have excessive sun exposure. Discussed the need for sun protection: hats, long sleeves and use of sunscreen if there is significant sun exposure.   Diet: the importance of a healthy diet is discussed. They do have a healthy diet.  The benefits of regular aerobic exercise were discussed. She walks 4 times per week ,  20 minutes.   Depression screen: there are no signs or vegative symptoms of depression- irritability, change in appetite, anhedonia, sadness/tearfullness.  Cognitive assessment: the patient manages all their financial and personal affairs and is actively engaged. They could relate day,date,year and events; recalled 2/3 objects at 3 minutes; performed clock-face test normally.  The following portions of the patient's history were reviewed and updated as appropriate:  allergies, current medications, past family history, past medical history,  past surgical history, past social history  and problem list.  Visual acuity was not assessed per patient preference since she has regular follow up with her ophthalmologist. Hearing and body mass index were assessed and reviewed.   During the course of the visit the patient was educated and counseled about appropriate screening and preventive services including : fall prevention , diabetes screening, nutrition counseling, colorectal cancer screening, and recommended immunizations.    CC: The primary encounter diagnosis was Encounter for immunization. Diagnoses of Screening for breast cancer, Osteopenia due to cancer therapy, Near syncope, Medicare annual wellness visit, subsequent, Hyperlipidemia, Gastroesophageal reflux disease without esophagitis, and Personal history of breast cancer were also pertinent to this visit.  Had near syncopal event last weekend which she believes occured from getting overheated while working outside  .  She had been doing yardwork for about  40 minutes of yardwork and suddenly became very weak and dizzy. She was able to crawl back to the house and lie down.  She felt better after drinking 16 ounces of water and resting for  A few hours.    Last syncopal episode occurred 1.5 years ago prior to last physical   She has had several episodes of vertigo with nausea  two weeks ago,  symptoms improved after a few hours of rest,  History Lori Caldwell has a past medical history of breast cancer; Hyperlipidemia; and GERD (gastroesophageal reflux disease).   She has past surgical history that includes Breast  surgery.   Her family history includes Cancer in her maternal grandmother; Heart disease in her father; Hypertension in her mother.She reports that she has never smoked. She has never used smokeless tobacco. She reports that she drinks alcohol. She reports that she does not use illicit  drugs.  Outpatient Prescriptions Prior to Visit  Medication Sig Dispense Refill  . aspirin 81 MG tablet Take 81 mg by mouth daily.      Marland Kitchen esomeprazole (NEXIUM) 40 MG capsule Take 1 capsule (40 mg total) by mouth daily before breakfast. 30 capsule 5  . hydrocortisone 2.5 % cream Apply topically 2 (two) times daily. 30 g 2  . hydrocortisone-pramoxine (ANALPRAM-HC) 2.5-1 % rectal cream Apply topicalyl twice daily as needed 30 g 0  . Multiple Vitamin (MULTIVITAMIN) tablet Take 1 tablet by mouth daily.      . simvastatin (ZOCOR) 40 MG tablet Take 1 tablet (40 mg total) by mouth every evening. 30 tablet 5  . Pramoxine-HC (HYDROCORTISONE ACE-PRAMOXINE) 2.5-1 % CREA Apply topically 2 (two) times daily. 30 g 0  . predniSONE (DELTASONE) 20 MG tablet Take 2 tablets (40 mg total) by mouth daily with breakfast. For 7 days then 20mg  po daily for 7 days then 10mg  po for 4 days 23 tablet 0  . predniSONE (DELTASONE) 5 MG tablet Take 1 tablet (5 mg total) by mouth daily with breakfast. 3 tablet 0  . sulfamethoxazole-trimethoprim (BACTRIM DS,SEPTRA DS) 800-160 MG per tablet Take 1 tablet by mouth 2 (two) times daily. 14 tablet 0   No facility-administered medications prior to visit.    Review of Systems   Patient denies headache, fevers, malaise, unintentional weight loss, skin rash, eye pain, sinus congestion and sinus pain, sore throat, dysphagia,  hemoptysis , cough, dyspnea, wheezing, chest pain, palpitations, orthopnea, edema, abdominal pain, nausea, melena, diarrhea, constipation, flank pain, dysuria, hematuria, urinary  Frequency, nocturia, numbness, tingling, seizures,  Focal weakness, Loss of consciousness,  Tremor, insomnia, depression, anxiety, and suicidal ideation.     Objective:  BP 114/78 mmHg  Pulse 79  Temp(Src) 97.7 F (36.5 C) (Oral)  Resp 14  Ht 5' 6.75" (1.695 m)  Wt 165 lb 4 oz (74.957 kg)  BMI 26.09 kg/m2  SpO2 99%  Physical Exam   General appearance: alert, cooperative and  appears stated age Head: Normocephalic, without obvious abnormality, atraumatic Eyes: conjunctivae/corneas clear. PERRL, EOM's intact. Fundi benign. Ears: normal TM's and external ear canals both ears Nose: Nares normal. Septum midline. Mucosa normal. No drainage or sinus tenderness. Throat: lips, mucosa, and tongue normal; teeth and gums normal Neck: no adenopathy, no carotid bruit, no JVD, supple, symmetrical, trachea midline and thyroid not enlarged, symmetric, no tenderness/mass/nodules Lungs: clear to auscultation bilaterally Breasts: right mastectomy, left breast normal appearance, no masses or tenderness Heart: regular rate and rhythm, S1, S2 normal, no murmur, click, rub or gallop Abdomen: soft, non-tender; bowel sounds normal; no masses,  no organomegaly Extremities: extremities normal, atraumatic, no cyanosis or edema Pulses: 2+ and symmetric Skin: Skin color, texture, turgor normal. No rashes or lesions Neurologic: Alert and oriented X 3, normal strength and tone. Normal symmetric reflexes. Normal coordination and gait.     Assessment & Plan:   Problem List Items Addressed This Visit      Unprioritized   Personal history of breast cancer    She has had no recurrence since completing therapy in  2001 and has  been released by Oncology .  Annual breast exam was normal and annual unilateral  diagnostic mammogram of left breast  Was ordered.          Hyperlipidemia    Managed with zocor.  LDL and triglycerides are at goal on current medications. She has no side effects and liver enzymes are normal. No changes today  Lab Results  Component Value Date   CHOL 177 01/02/2015   HDL 58.80 01/02/2015   LDLCALC 82 01/02/2015   LDLDIRECT 117.8 06/25/2013   TRIG 179.0* 01/02/2015   CHOLHDL 3 01/02/2015   Lab Results  Component Value Date   ALT 21 01/02/2015   AST 26 01/02/2015   ALKPHOS 49 01/02/2015   BILITOT 0.6 01/02/2015         GERD (gastroesophageal reflux  disease)    Discussed current controversy regarding prolonged use of PPI in patients without documented Barretts esophagus.  Patient has no prior EGD but has been on PPI therapy for > 5 years (per patient).  Suggested trial of pepcid 20 mg daily.  If GERD symptoms return,  advised her to accept referral for EGD.         Relevant Medications   meclizine (ANTIVERT) 25 MG tablet   Osteopenia due to cancer therapy    Improved by last DEXA in 2010,  She is due for repeat DEXA scan this year         Relevant Orders   DG Bone Density   Medicare annual wellness visit, subsequent    Annual Medicare wellness  exam was done as well as a comprehensive physical exam and management of acute and chronic conditions .  During the course of the visit the patient was educated and counseled about appropriate screening and preventive services including : fall prevention , diabetes screening, nutrition counseling, colorectal cancer screening, and recommended immunizations.  Printed recommendations for health maintenance screenings was given.       Near syncope    Recent episode occurred in the setting of outside yardwork without adequate shade and hydration.  Her exam is normal today       Other Visit Diagnoses    Encounter for immunization    -  Primary    Screening for breast cancer        Relevant Orders    MM Digital Diagnostic Unilat L       I have discontinued Ms. Dye sulfamethoxazole-trimethoprim, predniSONE, and predniSONE. I am also having her start on diazepam and meclizine. Additionally, I am having her maintain her aspirin, multivitamin, hydrocortisone-pramoxine, hydrocortisone, simvastatin, esomeprazole, and Hydrocortisone Ace-Pramoxine.  Meds ordered this encounter  Medications  . diazepam (VALIUM) 5 MG tablet    Sig: Take 1 tablet (5 mg total) by mouth every 12 (twelve) hours as needed for anxiety.    Dispense:  30 tablet    Refill:  1  . meclizine (ANTIVERT) 25 MG tablet     Sig: Take 1 tablet (25 mg total) by mouth 3 (three) times daily as needed for dizziness.    Dispense:  30 tablet    Refill:  0  . Pramoxine-HC (HYDROCORTISONE ACE-PRAMOXINE) 2.5-1 % CREA    Sig: Apply topically 2 (two) times daily.    Dispense:  30 g    Refill:  5    Medications Discontinued During This Encounter  Medication Reason  . predniSONE (DELTASONE) 20 MG tablet Completed Course  . predniSONE (DELTASONE) 5 MG tablet Completed Course  . sulfamethoxazole-trimethoprim (BACTRIM DS,SEPTRA DS) 800-160 MG per tablet Patient Preference  . Pramoxine-HC (HYDROCORTISONE ACE-PRAMOXINE) 2.5-1 % CREA Reorder  Follow-up: No Follow-up on file.   Crecencio Mc, MD

## 2015-01-15 NOTE — Patient Instructions (Addendum)
I have given you 2 medications for treatment of vertigo: meclizine,  And valium.  You can take both but they will make you drowsy.   I appreciate your concern about continuing your PPI (nexium)  in light of the recently published studies suggesting an association with increased risk of dementia and kidney failure.  I advise you to try switching  From your PPI to either famotidine 20 mg once or twice daily,  or to  ranitidine 150 mg once or twice daily.  These medications are  H2 blockers and are available without a prescriptions.   if your reflux symptoms are controlled,  You can Continue the daily h2 blocker.     Please take a probiotic ( Align, Floraque or Culturelle) or the generic version of one of these  For a minimum of 3 weeks whenever you are treated with an antibiotic,  to prevent a serious antibiotic associated diarrhea  Called clostridium dificile colitis   DEXA and mammogram of left breast have been ordered  Health Maintenance Adopting a healthy lifestyle and getting preventive care can go a long way to promote health and wellness. Talk with your health care provider about what schedule of regular examinations is right for you. This is a good chance for you to check in with your provider about disease prevention and staying healthy. In between checkups, there are plenty of things you can do on your own. Experts have done a lot of research about which lifestyle changes and preventive measures are most likely to keep you healthy. Ask your health care provider for more information. WEIGHT AND DIET  Eat a healthy diet  Be sure to include plenty of vegetables, fruits, low-fat dairy products, and lean protein.  Do not eat a lot of foods high in solid fats, added sugars, or salt.  Get regular exercise. This is one of the most important things you can do for your health.  Most adults should exercise for at least 150 minutes each week. The exercise should increase your heart rate and make  you sweat (moderate-intensity exercise).  Most adults should also do strengthening exercises at least twice a week. This is in addition to the moderate-intensity exercise.  Maintain a healthy weight  Body mass index (BMI) is a measurement that can be used to identify possible weight problems. It estimates body fat based on height and weight. Your health care provider can help determine your BMI and help you achieve or maintain a healthy weight.  For females 49 years of age and older:   A BMI below 18.5 is considered underweight.  A BMI of 18.5 to 24.9 is normal.  A BMI of 25 to 29.9 is considered overweight.  A BMI of 30 and above is considered obese.  Watch levels of cholesterol and blood lipids  You should start having your blood tested for lipids and cholesterol at 73 years of age, then have this test every 5 years.  You may need to have your cholesterol levels checked more often if:  Your lipid or cholesterol levels are high.  You are older than 73 years of age.  You are at high risk for heart disease.  CANCER SCREENING   Lung Cancer  Lung cancer screening is recommended for adults 44-13 years old who are at high risk for lung cancer because of a history of smoking.  A yearly low-dose CT scan of the lungs is recommended for people who:  Currently smoke.  Have quit within the past 15  years.  Have at least a 30-pack-year history of smoking. A pack year is smoking an average of one pack of cigarettes a day for 1 year.  Yearly screening should continue until it has been 15 years since you quit.  Yearly screening should stop if you develop a health problem that would prevent you from having lung cancer treatment.  Breast Cancer  Practice breast self-awareness. This means understanding how your breasts normally appear and feel.  It also means doing regular breast self-exams. Let your health care provider know about any changes, no matter how small.  If you are in  your 20s or 30s, you should have a clinical breast exam (CBE) by a health care provider every 1-3 years as part of a regular health exam.  If you are 64 or older, have a CBE every year. Also consider having a breast X-ray (mammogram) every year.  If you have a family history of breast cancer, talk to your health care provider about genetic screening.  If you are at high risk for breast cancer, talk to your health care provider about having an MRI and a mammogram every year.  Breast cancer gene (BRCA) assessment is recommended for women who have family members with BRCA-related cancers. BRCA-related cancers include:  Breast.  Ovarian.  Tubal.  Peritoneal cancers.  Results of the assessment will determine the need for genetic counseling and BRCA1 and BRCA2 testing. Cervical Cancer Routine pelvic examinations to screen for cervical cancer are no longer recommended for nonpregnant women who are considered low risk for cancer of the pelvic organs (ovaries, uterus, and vagina) and who do not have symptoms. A pelvic examination may be necessary if you have symptoms including those associated with pelvic infections. Ask your health care provider if a screening pelvic exam is right for you.   The Pap test is the screening test for cervical cancer for women who are considered at risk.  If you had a hysterectomy for a problem that was not cancer or a condition that could lead to cancer, then you no longer need Pap tests.  If you are older than 65 years, and you have had normal Pap tests for the past 10 years, you no longer need to have Pap tests.  If you have had past treatment for cervical cancer or a condition that could lead to cancer, you need Pap tests and screening for cancer for at least 20 years after your treatment.  If you no longer get a Pap test, assess your risk factors if they change (such as having a new sexual partner). This can affect whether you should start being screened  again.  Some women have medical problems that increase their chance of getting cervical cancer. If this is the case for you, your health care provider may recommend more frequent screening and Pap tests.  The human papillomavirus (HPV) test is another test that may be used for cervical cancer screening. The HPV test looks for the virus that can cause cell changes in the cervix. The cells collected during the Pap test can be tested for HPV.  The HPV test can be used to screen women 4 years of age and older. Getting tested for HPV can extend the interval between normal Pap tests from three to five years.  An HPV test also should be used to screen women of any age who have unclear Pap test results.  After 73 years of age, women should have HPV testing as often as Pap tests.  Colorectal Cancer  This type of cancer can be detected and often prevented.  Routine colorectal cancer screening usually begins at 73 years of age and continues through 73 years of age.  Your health care provider may recommend screening at an earlier age if you have risk factors for colon cancer.  Your health care provider may also recommend using home test kits to check for hidden blood in the stool.  A small camera at the end of a tube can be used to examine your colon directly (sigmoidoscopy or colonoscopy). This is done to check for the earliest forms of colorectal cancer.  Routine screening usually begins at age 110.  Direct examination of the colon should be repeated every 5-10 years through 73 years of age. However, you may need to be screened more often if early forms of precancerous polyps or small growths are found. Skin Cancer  Check your skin from head to toe regularly.  Tell your health care provider about any new moles or changes in moles, especially if there is a change in a mole's shape or color.  Also tell your health care provider if you have a mole that is larger than the size of a pencil  eraser.  Always use sunscreen. Apply sunscreen liberally and repeatedly throughout the day.  Protect yourself by wearing long sleeves, pants, a wide-brimmed hat, and sunglasses whenever you are outside. HEART DISEASE, DIABETES, AND HIGH BLOOD PRESSURE   Have your blood pressure checked at least every 1-2 years. High blood pressure causes heart disease and increases the risk of stroke.  If you are between 75 years and 6 years old, ask your health care provider if you should take aspirin to prevent strokes.  Have regular diabetes screenings. This involves taking a blood sample to check your fasting blood sugar level.  If you are at a normal weight and have a low risk for diabetes, have this test once every three years after 73 years of age.  If you are overweight and have a high risk for diabetes, consider being tested at a younger age or more often. PREVENTING INFECTION  Hepatitis B  If you have a higher risk for hepatitis B, you should be screened for this virus. You are considered at high risk for hepatitis B if:  You were born in a country where hepatitis B is common. Ask your health care provider which countries are considered high risk.  Your parents were born in a high-risk country, and you have not been immunized against hepatitis B (hepatitis B vaccine).  You have HIV or AIDS.  You use needles to inject street drugs.  You live with someone who has hepatitis B.  You have had sex with someone who has hepatitis B.  You get hemodialysis treatment.  You take certain medicines for conditions, including cancer, organ transplantation, and autoimmune conditions. Hepatitis C  Blood testing is recommended for:  Everyone born from [birth year] through 1964.  Anyone with known risk factors for hepatitis C. Sexually transmitted infections (STIs)  You should be screened for sexually transmitted infections (STIs) including gonorrhea and chlamydia if:  You are sexually active and are  younger than 73 years of age.  You are older than 73 years of age and your health care provider tells you that you are at risk for this type of infection.  Your sexual activity has changed since you were last screened and you are at an increased risk for chlamydia or gonorrhea. Ask your health care provider  if you are at risk.  If you do not have HIV, but are at risk, it may be recommended that you take a prescription medicine daily to prevent HIV infection. This is called pre-exposure prophylaxis (PrEP). You are considered at risk if:  You are sexually active and do not regularly use condoms or know the HIV status of your partner(s).  You take drugs by injection.  You are sexually active with a partner who has HIV. Talk with your health care provider about whether you are at high risk of being infected with HIV. If you choose to begin PrEP, you should first be tested for HIV. You should then be tested every 3 months for as long as you are taking PrEP.  PREGNANCY   If you are premenopausal and you may become pregnant, ask your health care provider about preconception counseling.  If you may become pregnant, take 400 to 800 micrograms (mcg) of folic acid every day.  If you want to prevent pregnancy, talk to your health care provider about birth control (contraception). OSTEOPOROSIS AND MENOPAUSE   Osteoporosis is a disease in which the bones lose minerals and strength with aging. This can result in serious bone fractures. Your risk for osteoporosis can be identified using a bone density scan.  If you are 10 years of age or older, or if you are at risk for osteoporosis and fractures, ask your health care provider if you should be screened.  Ask your health care provider whether you should take a calcium or vitamin D supplement to lower your risk for osteoporosis.  Menopause may have certain physical symptoms and risks.  Hormone replacement therapy may reduce some of these symptoms and  risks. Talk to your health care provider about whether hormone replacement therapy is right for you.  HOME CARE INSTRUCTIONS   Schedule regular health, dental, and eye exams.  Stay current with your immunizations.   Do not use any tobacco products including cigarettes, chewing tobacco, or electronic cigarettes.  If you are pregnant, do not drink alcohol.  If you are breastfeeding, limit how much and how often you drink alcohol.  Limit alcohol intake to no more than 1 drink per day for nonpregnant women. One drink equals 12 ounces of beer, 5 ounces of wine, or 1 ounces of hard liquor.  Do not use street drugs.  Do not share needles.  Ask your health care provider for help if you need support or information about quitting drugs.  Tell your health care provider if you often feel depressed.  Tell your health care provider if you have ever been abused or do not feel safe at home. Document Released: 11/02/2010 Document Revised: 09/03/2013 Document Reviewed: 03/21/2013 [facility] Patient Information 2015 [place], [place]. This information is not intended to replace advice given to you by your health care provider. Make sure you discuss any questions you have with your health care provider.

## 2015-01-16 ENCOUNTER — Encounter: Payer: Self-pay | Admitting: Internal Medicine

## 2015-01-16 DIAGNOSIS — R55 Syncope and collapse: Secondary | ICD-10-CM | POA: Insufficient documentation

## 2015-01-16 DIAGNOSIS — Z87898 Personal history of other specified conditions: Secondary | ICD-10-CM | POA: Insufficient documentation

## 2015-01-16 NOTE — Assessment & Plan Note (Signed)

## 2015-01-16 NOTE — Assessment & Plan Note (Addendum)
She has had no recurrence since completing therapy in  2001 and has  been released by Oncology .  Annual breast exam was normal and annual unilateral diagnostic mammogram of left breast  Was ordered.

## 2015-01-16 NOTE — Assessment & Plan Note (Signed)
Discussed current controversy regarding prolonged use of PPI in patients without documented Barretts esophagus.  Patient has no prior EGD but has been on PPI therapy for > 5 years (per patient).  Suggested trial of pepcid 20 mg daily.  If GERD symptoms return,  advised her to accept referral for EGD.  

## 2015-01-16 NOTE — Assessment & Plan Note (Signed)
Recent episode occurred in the setting of outside yardwork without adequate shade and hydration.  Her exam is normal today

## 2015-01-16 NOTE — Assessment & Plan Note (Signed)
Improved by last DEXA in 2010,  She is due for repeat DEXA scan this year

## 2015-01-16 NOTE — Assessment & Plan Note (Signed)
Managed with zocor.  LDL and triglycerides are at goal on current medications. She has no side effects and liver enzymes are normal. No changes today  Lab Results  Component Value Date   CHOL 177 01/02/2015   HDL 58.80 01/02/2015   LDLCALC 82 01/02/2015   LDLDIRECT 117.8 06/25/2013   TRIG 179.0* 01/02/2015   CHOLHDL 3 01/02/2015   Lab Results  Component Value Date   ALT 21 01/02/2015   AST 26 01/02/2015   ALKPHOS 49 01/02/2015   BILITOT 0.6 01/02/2015

## 2015-01-17 NOTE — Addendum Note (Signed)
Addended by: Crecencio Mc on: 01/17/2015 05:38 PM   Modules accepted: Miquel Dunn

## 2015-01-22 ENCOUNTER — Encounter: Payer: Self-pay | Admitting: Internal Medicine

## 2015-01-27 DIAGNOSIS — L821 Other seborrheic keratosis: Secondary | ICD-10-CM | POA: Diagnosis not present

## 2015-01-27 DIAGNOSIS — D485 Neoplasm of uncertain behavior of skin: Secondary | ICD-10-CM | POA: Diagnosis not present

## 2015-01-27 DIAGNOSIS — B079 Viral wart, unspecified: Secondary | ICD-10-CM | POA: Diagnosis not present

## 2015-01-27 DIAGNOSIS — Z85828 Personal history of other malignant neoplasm of skin: Secondary | ICD-10-CM | POA: Diagnosis not present

## 2015-01-27 DIAGNOSIS — C44319 Basal cell carcinoma of skin of other parts of face: Secondary | ICD-10-CM | POA: Diagnosis not present

## 2015-02-04 DIAGNOSIS — E2839 Other primary ovarian failure: Secondary | ICD-10-CM | POA: Diagnosis not present

## 2015-02-04 DIAGNOSIS — Z853 Personal history of malignant neoplasm of breast: Secondary | ICD-10-CM | POA: Diagnosis not present

## 2015-02-04 DIAGNOSIS — M85851 Other specified disorders of bone density and structure, right thigh: Secondary | ICD-10-CM | POA: Diagnosis not present

## 2015-02-04 DIAGNOSIS — Z9011 Acquired absence of right breast and nipple: Secondary | ICD-10-CM | POA: Diagnosis not present

## 2015-02-04 DIAGNOSIS — M8588 Other specified disorders of bone density and structure, other site: Secondary | ICD-10-CM | POA: Diagnosis not present

## 2015-02-04 DIAGNOSIS — R928 Other abnormal and inconclusive findings on diagnostic imaging of breast: Secondary | ICD-10-CM | POA: Diagnosis not present

## 2015-02-04 DIAGNOSIS — M85852 Other specified disorders of bone density and structure, left thigh: Secondary | ICD-10-CM | POA: Diagnosis not present

## 2015-02-04 DIAGNOSIS — M85862 Other specified disorders of bone density and structure, left lower leg: Secondary | ICD-10-CM | POA: Diagnosis not present

## 2015-02-04 LAB — HM DEXA SCAN

## 2015-02-04 LAB — HM MAMMOGRAPHY

## 2015-02-10 ENCOUNTER — Other Ambulatory Visit: Payer: Self-pay

## 2015-02-10 ENCOUNTER — Encounter: Payer: Self-pay | Admitting: Internal Medicine

## 2015-02-10 MED ORDER — SIMVASTATIN 40 MG PO TABS: 40.0000 mg | ORAL_TABLET | Freq: Every evening | ORAL | Status: DC

## 2015-02-12 ENCOUNTER — Telehealth: Payer: Self-pay | Admitting: Internal Medicine

## 2015-02-12 NOTE — Telephone Encounter (Signed)
Patient notified

## 2015-02-12 NOTE — Telephone Encounter (Signed)
Bone Density scores received, she has osteopenia,  Moderate no significant change compared to 2010,  But different machines were used. .  Would repeat in 2 years at [facility] facility and  consider therapy then if there is a significant change. Continue calcium, vitamin d and weight bearing exercise on a regular basis.

## 2015-02-12 NOTE — Telephone Encounter (Signed)
Patient notified and voiced understanding.

## 2015-02-12 NOTE — Telephone Encounter (Signed)
And Mammogram was Normal.  The facility will mail her the results in a letter. continue annual diagnostics

## 2015-04-30 ENCOUNTER — Encounter: Payer: Self-pay | Admitting: *Deleted

## 2015-05-28 DIAGNOSIS — C44319 Basal cell carcinoma of skin of other parts of face: Secondary | ICD-10-CM | POA: Diagnosis not present

## 2015-06-24 DIAGNOSIS — H35373 Puckering of macula, bilateral: Secondary | ICD-10-CM | POA: Diagnosis not present

## 2015-06-24 DIAGNOSIS — H2513 Age-related nuclear cataract, bilateral: Secondary | ICD-10-CM | POA: Diagnosis not present

## 2015-06-24 DIAGNOSIS — H524 Presbyopia: Secondary | ICD-10-CM | POA: Diagnosis not present

## 2015-06-24 DIAGNOSIS — H04123 Dry eye syndrome of bilateral lacrimal glands: Secondary | ICD-10-CM | POA: Diagnosis not present

## 2015-06-24 DIAGNOSIS — H21511 Anterior synechiae (iris), right eye: Secondary | ICD-10-CM | POA: Diagnosis not present

## 2015-07-07 DIAGNOSIS — H6123 Impacted cerumen, bilateral: Secondary | ICD-10-CM | POA: Diagnosis not present

## 2015-07-07 DIAGNOSIS — H903 Sensorineural hearing loss, bilateral: Secondary | ICD-10-CM | POA: Diagnosis not present

## 2015-07-21 ENCOUNTER — Encounter: Payer: Self-pay | Admitting: Internal Medicine

## 2015-07-21 ENCOUNTER — Telehealth: Payer: Self-pay | Admitting: Internal Medicine

## 2015-07-21 DIAGNOSIS — R5383 Other fatigue: Secondary | ICD-10-CM

## 2015-07-21 DIAGNOSIS — E559 Vitamin D deficiency, unspecified: Secondary | ICD-10-CM

## 2015-07-21 DIAGNOSIS — Z7289 Other problems related to lifestyle: Secondary | ICD-10-CM

## 2015-07-21 DIAGNOSIS — E785 Hyperlipidemia, unspecified: Secondary | ICD-10-CM

## 2015-07-21 NOTE — Telephone Encounter (Signed)
Pt called needing orders for lab work. Call pt @ [phone]. Leave mess on vm. Thank you!

## 2015-07-21 NOTE — Telephone Encounter (Signed)
Please advise, thanks.

## 2015-07-22 NOTE — Telephone Encounter (Signed)
Patient made aware and told to schedule appointment with [facility].

## 2015-07-30 ENCOUNTER — Other Ambulatory Visit (INDEPENDENT_AMBULATORY_CARE_PROVIDER_SITE_OTHER): Payer: Medicare Other

## 2015-07-30 DIAGNOSIS — E559 Vitamin D deficiency, unspecified: Secondary | ICD-10-CM | POA: Diagnosis not present

## 2015-07-30 DIAGNOSIS — Z7289 Other problems related to lifestyle: Secondary | ICD-10-CM

## 2015-07-30 DIAGNOSIS — E785 Hyperlipidemia, unspecified: Secondary | ICD-10-CM

## 2015-07-30 DIAGNOSIS — R5383 Other fatigue: Secondary | ICD-10-CM | POA: Diagnosis not present

## 2015-07-30 LAB — COMPREHENSIVE METABOLIC PANEL
ALK PHOS: 48 U/L (ref 39–117)
ALT: 20 U/L (ref 0–35)
AST: 21 U/L (ref 0–37)
Albumin: 4.3 g/dL (ref 3.5–5.2)
BUN: 16 mg/dL (ref 6–23)
CO2: 28 meq/L (ref 19–32)
Calcium: 9.4 mg/dL (ref 8.4–10.5)
Chloride: 106 mEq/L (ref 96–112)
Creatinine, Ser: 0.75 mg/dL (ref 0.40–1.20)
GFR: 80.33 mL/min (ref >60.00)
GLUCOSE: 95 mg/dL (ref 70–99)
POTASSIUM: 4.9 meq/L (ref 3.5–5.1)
Sodium: 140 mEq/L (ref 135–145)
Total Bilirubin: 0.5 mg/dL (ref 0.2–1.2)
Total Protein: 7.1 g/dL (ref 6.0–8.3)

## 2015-07-30 LAB — CBC WITH DIFFERENTIAL/PLATELET
BASOS ABS: 0 10*3/uL (ref 0.0–0.1)
BASOS PCT: 0.4 % (ref 0.0–3.0)
EOS PCT: 0.9 % (ref 0.0–5.0)
Eosinophils Absolute: 0.1 10*3/uL (ref 0.0–0.7)
HCT: 41.5 % (ref 36.0–46.0)
HEMOGLOBIN: 14.1 g/dL (ref 12.0–15.0)
LYMPHS ABS: 1.8 10*3/uL (ref 0.7–4.0)
LYMPHS PCT: 28.4 % (ref 12.0–46.0)
MCHC: 34 g/dL (ref 30.0–36.0)
MCV: 96.1 fl (ref 78.0–100.0)
MONO ABS: 0.5 10*3/uL (ref 0.1–1.0)
Monocytes Relative: 7.7 % (ref 3.0–12.0)
NEUTROS PCT: 62.6 % (ref 43.0–77.0)
Neutro Abs: 4 10*3/uL (ref 1.4–7.7)
Platelets: 232 10*3/uL (ref 150.0–400.0)
RBC: 4.32 Mil/uL (ref 3.87–5.11)
RDW: 13.9 % (ref 11.5–15.5)
WBC: 6.4 10*3/uL (ref 4.0–10.5)

## 2015-07-30 LAB — TSH: TSH: 3.12 u[IU]/mL (ref 0.35–4.50)

## 2015-07-30 LAB — LIPID PANEL
CHOLESTEROL: 179 mg/dL (ref 0–200)
HDL: 74.7 mg/dL (ref >39.00)
LDL CALC: 90 mg/dL (ref 0–99)
NonHDL: 104.7
TRIGLYCERIDES: 74 mg/dL (ref 0.0–149.0)
Total CHOL/HDL Ratio: 2
VLDL: 14.8 mg/dL (ref 0.0–40.0)

## 2015-07-30 LAB — VITAMIN D 25 HYDROXY (VIT D DEFICIENCY, FRACTURES): VITD: 38.53 ng/mL (ref 30.00–100.00)

## 2015-07-31 LAB — HIV ANTIBODY (ROUTINE TESTING W REFLEX): HIV: NONREACTIVE

## 2015-07-31 LAB — HEPATITIS C ANTIBODY: HCV Ab: NEGATIVE

## 2015-08-01 ENCOUNTER — Encounter: Payer: Self-pay | Admitting: Internal Medicine

## 2015-08-04 ENCOUNTER — Encounter: Payer: Self-pay | Admitting: Internal Medicine

## 2015-08-05 DIAGNOSIS — I781 Nevus, non-neoplastic: Secondary | ICD-10-CM | POA: Diagnosis not present

## 2015-08-05 DIAGNOSIS — B079 Viral wart, unspecified: Secondary | ICD-10-CM | POA: Diagnosis not present

## 2015-08-05 DIAGNOSIS — D225 Melanocytic nevi of trunk: Secondary | ICD-10-CM | POA: Diagnosis not present

## 2015-08-05 DIAGNOSIS — L821 Other seborrheic keratosis: Secondary | ICD-10-CM | POA: Diagnosis not present

## 2015-08-05 DIAGNOSIS — D485 Neoplasm of uncertain behavior of skin: Secondary | ICD-10-CM | POA: Diagnosis not present

## 2015-08-05 DIAGNOSIS — Z85828 Personal history of other malignant neoplasm of skin: Secondary | ICD-10-CM | POA: Diagnosis not present

## 2015-08-19 NOTE — Telephone Encounter (Signed)
Mailed unread message to patient. thanks 

## 2015-08-26 ENCOUNTER — Encounter: Payer: Self-pay | Admitting: Internal Medicine

## 2015-09-16 DIAGNOSIS — B079 Viral wart, unspecified: Secondary | ICD-10-CM | POA: Diagnosis not present

## 2015-10-02 ENCOUNTER — Other Ambulatory Visit: Payer: Self-pay | Admitting: Internal Medicine

## 2015-10-31 DIAGNOSIS — C44319 Basal cell carcinoma of skin of other parts of face: Secondary | ICD-10-CM | POA: Diagnosis not present

## 2016-01-18 ENCOUNTER — Encounter: Payer: Self-pay | Admitting: Internal Medicine

## 2016-01-27 DIAGNOSIS — L821 Other seborrheic keratosis: Secondary | ICD-10-CM | POA: Diagnosis not present

## 2016-01-27 DIAGNOSIS — Z85828 Personal history of other malignant neoplasm of skin: Secondary | ICD-10-CM | POA: Diagnosis not present

## 2016-01-27 DIAGNOSIS — D225 Melanocytic nevi of trunk: Secondary | ICD-10-CM | POA: Diagnosis not present

## 2016-03-01 ENCOUNTER — Encounter: Payer: BC Managed Care – PPO | Admitting: Internal Medicine

## 2016-03-04 ENCOUNTER — Telehealth: Payer: Self-pay | Admitting: Internal Medicine

## 2016-03-04 NOTE — Telephone Encounter (Signed)
I called pt and left a vm to call office to sch AWV. Thank you! °

## 2016-03-05 ENCOUNTER — Encounter: Payer: Self-pay | Admitting: Internal Medicine

## 2016-03-05 ENCOUNTER — Telehealth: Payer: Self-pay | Admitting: Internal Medicine

## 2016-03-05 NOTE — Telephone Encounter (Signed)
I called pt and left msg confirming appt. Thank you!

## 2016-03-10 ENCOUNTER — Ambulatory Visit (INDEPENDENT_AMBULATORY_CARE_PROVIDER_SITE_OTHER): Payer: Medicare Other | Admitting: Internal Medicine

## 2016-03-10 ENCOUNTER — Encounter: Payer: Self-pay | Admitting: Internal Medicine

## 2016-03-10 VITALS — BP 160/88 | HR 66 | Ht 66.75 in | Wt 161.0 lb

## 2016-03-10 DIAGNOSIS — R5383 Other fatigue: Secondary | ICD-10-CM

## 2016-03-10 DIAGNOSIS — Z0000 Encounter for general adult medical examination without abnormal findings: Secondary | ICD-10-CM | POA: Diagnosis not present

## 2016-03-10 DIAGNOSIS — Z853 Personal history of malignant neoplasm of breast: Secondary | ICD-10-CM | POA: Diagnosis not present

## 2016-03-10 DIAGNOSIS — R030 Elevated blood-pressure reading, without diagnosis of hypertension: Secondary | ICD-10-CM

## 2016-03-10 DIAGNOSIS — E559 Vitamin D deficiency, unspecified: Secondary | ICD-10-CM | POA: Diagnosis not present

## 2016-03-10 DIAGNOSIS — E7800 Pure hypercholesterolemia, unspecified: Secondary | ICD-10-CM | POA: Diagnosis not present

## 2016-03-10 DIAGNOSIS — M8580 Other specified disorders of bone density and structure, unspecified site: Secondary | ICD-10-CM

## 2016-03-10 NOTE — Progress Notes (Signed)
Patient ID: Lori Caldwell, female    DOB: 05/05/1940  Age: 75 y.o. MRN: [id number]  The patient is here for annual Medicare wellness examination and management of other chronic and acute problems.  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  Walks on those days   The risk factors are reflected in the social history.  The roster of all physicians providing medical care to patient - is listed in the Snapshot section of the chart.  Activities of daily living:  The patient is 100% independent in all ADLs: dressing, toileting, feeding as well as independent mobility  Home safety : The patient has smoke detectors in the home. They wear seatbelts.  There are no firearms at home. There is no violence in the home.   There is no risks for hepatitis, STDs or HIV. There is no   history of blood transfusion. They have no travel history to infectious disease endemic areas of the world.  The patient has seen their dentist in the last six month. They have seen their eye doctor in the last year. They admit to slight hearing difficulty with regard to whispered voices and some television programs.  They have deferred audiologic testing in the last year.  They do not  have excessive sun exposure. Discussed the need for sun protection: hats, long sleeves and use of sunscreen if there is significant sun exposure.   Diet: the importance of a healthy diet is discussed. They do have a healthy diet.  The benefits of regular aerobic exercise were discussed. She walks 4 times per week ,  20 minutes.   Depression screen: there are no signs or vegative symptoms of depression- irritability, change in appetite, anhedonia, sadness/tearfullness.  Cognitive assessment: the patient manages all their financial and personal affairs and is actively engaged. They could relate  day,date,year and events; recalled 2/3 objects at 3 minutes; performed clock-face test normally.  The following portions of the patient's history were reviewed and updated as appropriate: allergies, current medications, past family history, past medical history,  past surgical history, past social history  and problem list.  Visual acuity was not assessed per patient preference since she has regular follow up with her ophthalmologist. Hearing and body mass index were assessed and reviewed.   During the course of the visit the patient was educated and counseled about appropriate screening and preventive services including : fall prevention , diabetes screening, nutrition counseling, colorectal cancer screening, and recommended immunizations.    CC: The primary encounter diagnosis was Personal history of breast cancer. Diagnoses of Vitamin D deficiency, Pure hypercholesterolemia, Fatigue, unspecified type, Medicare annual wellness visit, subsequent, Elevated blood-pressure reading without diagnosis of hypertension, and Osteopenia due to cancer therapy were also pertinent to this visit.  History Lori Caldwell has a past medical history of breast cancer; GERD (gastroesophageal reflux disease); and Hyperlipidemia.   She has a past surgical history that includes Breast surgery.   Her family history includes Cancer in her maternal grandmother; Heart disease in her father; Hypertension in her mother.She reports that she has never smoked. She has never used smokeless tobacco. She reports that she drinks alcohol. She reports that she does not use drugs.  Outpatient Medications Prior to Visit  Medication Sig Dispense Refill  . aspirin 81 MG tablet Take 81 mg by mouth daily.      Marland Kitchen  esomeprazole (NEXIUM) 40 MG capsule Take 1 capsule (40 mg total) by mouth daily before breakfast. 30 capsule 5  . hydrocortisone 2.5 % cream Apply topically 2 (two) times daily. 30 g 2  . hydrocortisone-pramoxine (ANALPRAM-HC) 2.5-1  % rectal cream Apply topicalyl twice daily as needed 30 g 0  . meclizine (ANTIVERT) 25 MG tablet Take 1 tablet (25 mg total) by mouth 3 (three) times daily as needed for dizziness. 30 tablet 0  . Multiple Vitamin (MULTIVITAMIN) tablet Take 1 tablet by mouth daily.      . Pramoxine-HC (HYDROCORTISONE ACE-PRAMOXINE) 2.5-1 % CREA Apply topically 2 (two) times daily. 30 g 5  . simvastatin (ZOCOR) 40 MG tablet Take 1 tablet (40 mg total) by mouth every evening. 30 tablet 5  . diazepam (VALIUM) 5 MG tablet Take 1 tablet (5 mg total) by mouth every 12 (twelve) hours as needed for anxiety. (Patient not taking: Reported on 03/10/2016) 30 tablet 1   No facility-administered medications prior to visit.     Review of Systems   Patient denies headache, fevers, malaise, unintentional weight loss, skin rash, eye pain, sinus congestion and sinus pain, sore throat, dysphagia,  hemoptysis , cough, dyspnea, wheezing, chest pain, palpitations, orthopnea, edema, abdominal pain, nausea, melena, diarrhea, constipation, flank pain, dysuria, hematuria, urinary  Frequency, nocturia, numbness, tingling, seizures,  Focal weakness, Loss of consciousness,  Tremor, insomnia, depression, anxiety, and suicidal ideation.     Objective:  BP (!) 160/88   Pulse 66   Ht 5' 6.75" (1.695 m)   Wt 161 lb (73 kg)   SpO2 99%   BMI 25.41 kg/m   Physical Exam   General appearance: alert, cooperative and appears stated age Head: Normocephalic, without obvious abnormality, atraumatic Eyes: conjunctivae/corneas clear. PERRL, EOM's intact. Fundi benign. Ears: normal TM's and external ear canals both ears Nose: Nares normal. Septum midline. Mucosa normal. No drainage or sinus tenderness. Throat: lips, mucosa, and tongue normal; teeth and gums normal Neck: no adenopathy, no carotid bruit, no JVD, supple, symmetrical, trachea midline and thyroid not enlarged, symmetric, no tenderness/mass/nodules Lungs: clear to auscultation  bilaterally Breasts: right mastectomy scar,  Left breaast normal appearance, no masses or tenderness Heart: regular rate and rhythm, S1, S2 normal, no murmur, click, rub or gallop Abdomen: soft, non-tender; bowel sounds normal; no masses,  no organomegaly Extremities: extremities normal, atraumatic, no cyanosis or edema Pulses: 2+ and symmetric Skin: Skin color, texture, turgor normal. No rashes or lesions Neurologic: Alert and oriented X 3, normal strength and tone. Normal symmetric reflexes. Normal coordination and gait.      Assessment & Plan:   Problem List Items Addressed This Visit    Personal history of breast cancer - Primary    She has had no recurrence since completing therapy in  2001 and has  been released by Oncology .  Annual breast exam was normal and annual unilateral diagnostic mammogram of left breast  Was ordered.          Relevant Orders   MM Digital Diagnostic Unilat L   Osteopenia due to cancer therapy    Spine t score -2.0  by last DEXA in 2016,  She is due for repeat DEXA scan next year         Medicare annual wellness visit, subsequent    Annual Medicare wellness  exam was done as well as a comprehensive physical exam and management of acute and chronic conditions .  During the course of the visit the patient was  educated and counseled about appropriate screening and preventive services including : fall prevention , diabetes screening, nutrition counseling, colorectal cancer screening, and recommended immunizations.  Printed recommendations for health maintenance screenings was given.       Elevated blood-pressure reading without diagnosis of hypertension    She has no history of hypertension but has had several elevated readings.  She has been asked to check her pressures at home and submit readings for evaluation. Renal function will be checked today      Hyperlipidemia   Relevant Orders   Lipid panel    Other Visit Diagnoses    Vitamin D deficiency        Relevant Orders   VITAMIN D 25 Hydroxy (Vit-D Deficiency, Fractures)   Fatigue, unspecified type       Relevant Orders   Comprehensive metabolic panel   TSH   CBC with Differential/Platelet      I am having Lori Caldwell maintain her aspirin, multivitamin, hydrocortisone-pramoxine, hydrocortisone, esomeprazole, diazepam, meclizine, Hydrocortisone Ace-Pramoxine, and simvastatin.  No orders of the defined types were placed in this encounter.   There are no discontinued medications.  Follow-up: Return in about 4 weeks (around 04/07/2016) for RN BP check .   Crecencio Mc, MD

## 2016-03-10 NOTE — Assessment & Plan Note (Signed)
No longer taking nexium.  symptomscontrolled with nighttime benadryl

## 2016-03-10 NOTE — Patient Instructions (Addendum)
If the cough returns while takign benadryl, try adding famotidine 20 mg or ranitidine  150 mg daily for reflux symptoms   Labs , fasting at [facility] (call them to make appt )   Mammogram has been ordered.  To be done at [facility] in [place].      Check you BP at home  Several times over the next week,  If all readings are > 140/80,  Call office so I can start you on medication   RTC  In one month for RN to check BP   Menopause is a normal process in which your reproductive ability comes to an end. This process happens gradually over a span of months to years, usually between the ages of 32 and 29. Menopause is complete when you have missed 12 consecutive menstrual periods. It is important to talk with your health care provider about some of the most common conditions that affect postmenopausal women, such as heart disease, cancer, and bone loss (osteoporosis). Adopting a healthy lifestyle and getting preventive care can help to promote your health and wellness. Those actions can also lower your chances of developing some of these common conditions. WHAT SHOULD I KNOW ABOUT MENOPAUSE? During menopause, you may experience a number of symptoms, such as:  Moderate-to-severe hot flashes.  Night sweats.  Decrease in sex drive.  Mood swings.  Headaches.  Tiredness.  Irritability.  Memory problems.  Insomnia. Choosing to treat or not to treat menopausal changes is an individual decision that you make with your health care provider. WHAT SHOULD I KNOW ABOUT HORMONE REPLACEMENT THERAPY AND SUPPLEMENTS? Hormone therapy products are effective for treating symptoms that are associated with menopause, such as hot flashes and night sweats. Hormone replacement carries certain risks, especially as you become older. If you are thinking about using estrogen or estrogen with progestin treatments, discuss the benefits and risks with your health care provider. WHAT SHOULD I KNOW ABOUT HEART  DISEASE AND STROKE? Heart disease, heart attack, and stroke become more likely as you age. This may be due, in part, to the hormonal changes that your body experiences during menopause. These can affect how your body processes dietary fats, triglycerides, and cholesterol. Heart attack and stroke are both medical emergencies. There are many things that you can do to help prevent heart disease and stroke:  Have your blood pressure checked at least every 1-2 years. High blood pressure causes heart disease and increases the risk of stroke.  If you are 84-67 years old, ask your health care provider if you should take aspirin to prevent a heart attack or a stroke.  Do not use any tobacco products, including cigarettes, chewing tobacco, or electronic cigarettes. If you need help quitting, ask your health care provider.  It is important to eat a healthy diet and maintain a healthy weight.  Be sure to include plenty of vegetables, fruits, low-fat dairy products, and lean protein.  Avoid eating foods that are high in solid fats, added sugars, or salt (sodium).  Get regular exercise. This is one of the most important things that you can do for your health.  Try to exercise for at least 150 minutes each week. The type of exercise that you do should increase your heart rate and make you sweat. This is known as moderate-intensity exercise.  Try to do strengthening exercises at least twice each week. Do these in addition to the moderate-intensity exercise.  Know your numbers.Ask your health care provider to check your cholesterol  and your blood glucose. Continue to have your blood tested as directed by your health care provider. WHAT SHOULD I KNOW ABOUT CANCER SCREENING? There are several types of cancer. Take the following steps to reduce your risk and to catch any cancer development as early as possible. Breast Cancer  Practice breast self-awareness.  This means understanding how your breasts  normally appear and feel.  It also means doing regular breast self-exams. Let your health care provider know about any changes, no matter how small.  If you are 40 or older, have a clinician do a breast exam (clinical breast exam or CBE) every year. Depending on your age, family history, and medical history, it may be recommended that you also have a yearly breast X-ray (mammogram).  If you have a family history of breast cancer, talk with your health care provider about genetic screening.  If you are at high risk for breast cancer, talk with your health care provider about having an MRI and a mammogram every year.  Breast cancer (BRCA) gene test is recommended for women who have family members with BRCA-related cancers. Results of the assessment will determine the need for genetic counseling and BRCA1 and for BRCA2 testing. BRCA-related cancers include these types:  Breast. This occurs in males or females.  Ovarian.  Tubal. This may also be called fallopian tube cancer.  Cancer of the abdominal or pelvic lining (peritoneal cancer).  Prostate.  Pancreatic. Cervical, Uterine, and Ovarian Cancer Your health care provider may recommend that you be screened regularly for cancer of the pelvic organs. These include your ovaries, uterus, and vagina. This screening involves a pelvic exam, which includes checking for microscopic changes to the surface of your cervix (Pap test).  For women ages 21-65, health care providers may recommend a pelvic exam and a Pap test every three years. For women ages 74-65, they may recommend the Pap test and pelvic exam, combined with testing for human papilloma virus (HPV), every five years. Some types of HPV increase your risk of cervical cancer. Testing for HPV may also be done on women of any age who have unclear Pap test results.  Other health care providers may not recommend any screening for nonpregnant women who are considered low risk for pelvic cancer and  have no symptoms. Ask your health care provider if a screening pelvic exam is right for you.  If you have had past treatment for cervical cancer or a condition that could lead to cancer, you need Pap tests and screening for cancer for at least 20 years after your treatment. If Pap tests have been discontinued for you, your risk factors (such as having a new sexual partner) need to be reassessed to determine if you should start having screenings again. Some women have medical problems that increase the chance of getting cervical cancer. In these cases, your health care provider may recommend that you have screening and Pap tests more often.  If you have a family history of uterine cancer or ovarian cancer, talk with your health care provider about genetic screening.  If you have vaginal bleeding after reaching menopause, tell your health care provider.  There are currently no reliable tests available to screen for ovarian cancer. Lung Cancer Lung cancer screening is recommended for adults 43-74 years old who are at high risk for lung cancer because of a history of smoking. A yearly low-dose CT scan of the lungs is recommended if you:  Currently smoke.  Have a history of at  least 30 pack-years of smoking and you currently smoke or have quit within the past 15 years. A pack-year is smoking an average of one pack of cigarettes per day for one year. Yearly screening should:  Continue until it has been 15 years since you quit.  Stop if you develop a health problem that would prevent you from having lung cancer treatment. Colorectal Cancer  This type of cancer can be detected and can often be prevented.  Routine colorectal cancer screening usually begins at age 66 and continues through age 69.  If you have risk factors for colon cancer, your health care provider may recommend that you be screened at an earlier age.  If you have a family history of colorectal cancer, talk with your health care  provider about genetic screening.  Your health care provider may also recommend using home test kits to check for hidden blood in your stool.  A small camera at the end of a tube can be used to examine your colon directly (sigmoidoscopy or colonoscopy). This is done to check for the earliest forms of colorectal cancer.  Direct examination of the colon should be repeated every 5-10 years until age 31. However, if early forms of precancerous polyps or small growths are found or if you have a family history or genetic risk for colorectal cancer, you may need to be screened more often. Skin Cancer  Check your skin from head to toe regularly.  Monitor any moles. Be sure to tell your health care provider:  About any new moles or changes in moles, especially if there is a change in a mole's shape or color.  If you have a mole that is larger than the size of a pencil eraser.  If any of your family members has a history of skin cancer, especially at a young age, talk with your health care provider about genetic screening.  Always use sunscreen. Apply sunscreen liberally and repeatedly throughout the day.  Whenever you are outside, protect yourself by wearing long sleeves, pants, a wide-brimmed hat, and sunglasses. WHAT SHOULD I KNOW ABOUT OSTEOPOROSIS? Osteoporosis is a condition in which bone destruction happens more quickly than new bone creation. After menopause, you may be at an increased risk for osteoporosis. To help prevent osteoporosis or the bone fractures that can happen because of osteoporosis, the following is recommended:  If you are 29-45 years old, get at least 1,000 mg of calcium and at least 600 mg of vitamin D per day.  If you are older than age 79 but younger than age 19, get at least 1,200 mg of calcium and at least 600 mg of vitamin D per day.  If you are older than age 37, get at least 1,200 mg of calcium and at least 800 mg of vitamin D per day. Smoking and excessive  alcohol intake increase the risk of osteoporosis. Eat foods that are rich in calcium and vitamin D, and do weight-bearing exercises several times each week as directed by your health care provider. WHAT SHOULD I KNOW ABOUT HOW MENOPAUSE AFFECTS [place]? Depression may occur at any age, but it is more common as you become older. Common symptoms of depression include:  Low or sad mood.  Changes in sleep patterns.  Changes in appetite or eating patterns.  Feeling an overall lack of motivation or enjoyment of activities that you previously enjoyed.  Frequent crying spells. Talk with your health care provider if you think that you are experiencing depression.  WHAT SHOULD I KNOW ABOUT IMMUNIZATIONS? It is important that you get and maintain your immunizations. These include:  Tetanus, diphtheria, and pertussis (Tdap) booster vaccine.  Influenza every year before the flu season begins.  Pneumonia vaccine.  Shingles vaccine. Your health care provider may also recommend other immunizations.   This information is not intended to replace advice given to you by your health care provider. Make sure you discuss any questions you have with your health care provider.   Document Released: 06/11/2005 Document Revised: 05/10/2014 Document Reviewed: 12/20/2013 Elsevier Interactive Patient Education [employer].

## 2016-03-12 DIAGNOSIS — R030 Elevated blood-pressure reading, without diagnosis of hypertension: Secondary | ICD-10-CM | POA: Insufficient documentation

## 2016-03-12 NOTE — Assessment & Plan Note (Signed)

## 2016-03-12 NOTE — Assessment & Plan Note (Signed)
She has had no recurrence since completing therapy in  2001 and has  been released by Oncology .  Annual breast exam was normal and annual unilateral diagnostic mammogram of left breast  Was ordered.

## 2016-03-12 NOTE — Assessment & Plan Note (Signed)
Spine t score -2.0  by last DEXA in 2016,  She is due for repeat DEXA scan next year

## 2016-03-12 NOTE — Assessment & Plan Note (Signed)
She has no history of hypertension but has had several elevated readings.  She has been asked to check her pressures at home and submit readings for evaluation. Renal function will be checked today

## 2016-03-22 ENCOUNTER — Encounter: Payer: Self-pay | Admitting: Internal Medicine

## 2016-03-22 DIAGNOSIS — Z853 Personal history of malignant neoplasm of breast: Secondary | ICD-10-CM

## 2016-03-23 ENCOUNTER — Other Ambulatory Visit (INDEPENDENT_AMBULATORY_CARE_PROVIDER_SITE_OTHER): Payer: Medicare Other

## 2016-03-23 DIAGNOSIS — R5383 Other fatigue: Secondary | ICD-10-CM

## 2016-03-23 DIAGNOSIS — E7800 Pure hypercholesterolemia, unspecified: Secondary | ICD-10-CM

## 2016-03-23 DIAGNOSIS — E559 Vitamin D deficiency, unspecified: Secondary | ICD-10-CM

## 2016-03-23 LAB — COMPREHENSIVE METABOLIC PANEL
ALT: 19 U/L (ref 0–35)
AST: 21 U/L (ref 0–37)
Albumin: 4.2 g/dL (ref 3.5–5.2)
Alkaline Phosphatase: 47 U/L (ref 39–117)
BILIRUBIN TOTAL: 0.6 mg/dL (ref 0.2–1.2)
BUN: 15 mg/dL (ref 6–23)
CALCIUM: 9.6 mg/dL (ref 8.4–10.5)
CHLORIDE: 105 meq/L (ref 96–112)
CO2: 29 meq/L (ref 19–32)
Creatinine, Ser: 0.84 mg/dL (ref 0.40–1.20)
GFR: 70.36 mL/min (ref >60.00)
GLUCOSE: 90 mg/dL (ref 70–99)
POTASSIUM: 4.6 meq/L (ref 3.5–5.1)
Sodium: 140 mEq/L (ref 135–145)
Total Protein: 6.9 g/dL (ref 6.0–8.3)

## 2016-03-23 LAB — LIPID PANEL
CHOL/HDL RATIO: 3
CHOLESTEROL: 192 mg/dL (ref 0–200)
HDL: 73.9 mg/dL (ref >39.00)
LDL CALC: 100 mg/dL — AB (ref 0–99)
NonHDL: 118.33
TRIGLYCERIDES: 94 mg/dL (ref 0.0–149.0)
VLDL: 18.8 mg/dL (ref 0.0–40.0)

## 2016-03-23 LAB — CBC WITH DIFFERENTIAL/PLATELET
Basophils Absolute: 0 10*3/uL (ref 0.0–0.1)
Basophils Relative: 0.4 % (ref 0.0–3.0)
EOS PCT: 1.4 % (ref 0.0–5.0)
Eosinophils Absolute: 0.1 10*3/uL (ref 0.0–0.7)
HEMATOCRIT: 41.1 % (ref 36.0–46.0)
HEMOGLOBIN: 14.2 g/dL (ref 12.0–15.0)
LYMPHS PCT: 30.5 % (ref 12.0–46.0)
Lymphs Abs: 1.8 10*3/uL (ref 0.7–4.0)
MCHC: 34.5 g/dL (ref 30.0–36.0)
MCV: 95.3 fl (ref 78.0–100.0)
MONO ABS: 0.5 10*3/uL (ref 0.1–1.0)
Monocytes Relative: 8.2 % (ref 3.0–12.0)
Neutro Abs: 3.4 10*3/uL (ref 1.4–7.7)
Neutrophils Relative %: 59.5 % (ref 43.0–77.0)
Platelets: 255 10*3/uL (ref 150.0–400.0)
RBC: 4.31 Mil/uL (ref 3.87–5.11)
RDW: 13.1 % (ref 11.5–15.5)
WBC: 5.8 10*3/uL (ref 4.0–10.5)

## 2016-03-23 LAB — TSH: TSH: 2.94 u[IU]/mL (ref 0.35–4.50)

## 2016-03-23 LAB — VITAMIN D 25 HYDROXY (VIT D DEFICIENCY, FRACTURES): VITD: 31.3 ng/mL (ref 30.00–100.00)

## 2016-03-23 NOTE — Telephone Encounter (Signed)
Looks like it is a diagnostic mammo from 03/10/16.

## 2016-03-24 ENCOUNTER — Encounter: Payer: Self-pay | Admitting: Internal Medicine

## 2016-03-24 NOTE — Telephone Encounter (Signed)
Since this has to be done at [facility] now will you please enter a left breast limited ultrasound please?

## 2016-03-24 NOTE — Telephone Encounter (Signed)
US ordered

## 2016-03-31 ENCOUNTER — Other Ambulatory Visit: Payer: Self-pay | Admitting: *Deleted

## 2016-03-31 ENCOUNTER — Inpatient Hospital Stay
Admission: RE | Admit: 2016-03-31 | Discharge: 2016-03-31 | Disposition: A | Discharge status: Home / Self Care | Payer: Self-pay | Source: Ambulatory Visit | Attending: *Deleted | Admitting: *Deleted

## 2016-03-31 DIAGNOSIS — Z9289 Personal history of other medical treatment: Secondary | ICD-10-CM

## 2016-04-01 ENCOUNTER — Other Ambulatory Visit: Payer: Self-pay | Admitting: Internal Medicine

## 2016-04-01 DIAGNOSIS — Z1231 Encounter for screening mammogram for malignant neoplasm of breast: Secondary | ICD-10-CM

## 2016-04-05 ENCOUNTER — Ambulatory Visit: Payer: BC Managed Care – PPO

## 2016-04-05 ENCOUNTER — Ambulatory Visit (INDEPENDENT_AMBULATORY_CARE_PROVIDER_SITE_OTHER): Payer: Medicare Other

## 2016-04-05 VITALS — BP 122/78 | HR 80 | Resp 16

## 2016-04-05 DIAGNOSIS — R030 Elevated blood-pressure reading, without diagnosis of hypertension: Secondary | ICD-10-CM | POA: Diagnosis not present

## 2016-04-05 NOTE — Progress Notes (Addendum)
Patient comes in for 4 week blood pressure check.  Only checked right arm due to patient had right mastectomy on right side.  Patient reports checking blood pressure at [facility] and blood pressure running about the same that the reading I got today.  Please advise.    Correction made to above statement:   LEFT ARM USED FOR BP CHECK (not right as previously stated)  BP 122/78 (BP Location: Left Arm, Patient Position: Sitting, Cuff Size: Normal)   Pulse 80   Resp 16   SpO2 98%    BP is normal.  No changes to medications needed..   I have corrected the above information and agree with above.   Deborra Medina, MD

## 2016-04-08 ENCOUNTER — Other Ambulatory Visit: Payer: Self-pay

## 2016-04-15 ENCOUNTER — Telehealth: Payer: Self-pay | Admitting: Internal Medicine

## 2016-04-15 NOTE — Telephone Encounter (Signed)
I called [facility] to schedule [place] per Lenna Sciara at [facility] stated that pt has rt mastectomy no problem with left just can get screening. Thank you!

## 2016-04-16 NOTE — Telephone Encounter (Signed)
Please advise and order if needed, thanks.

## 2016-04-16 NOTE — Telephone Encounter (Signed)
I s facility aware patient has HX asymmetry to lef breast that should be monitored per last Mammogram?

## 2016-04-16 NOTE — Telephone Encounter (Incomplete)
Please advise,

## 2016-04-29 ENCOUNTER — Ambulatory Visit
Admission: RE | Admit: 2016-04-29 | Discharge: 2016-04-29 | Disposition: A | Discharge status: Home / Self Care | Payer: Medicare Other | Source: Ambulatory Visit | Attending: Internal Medicine | Admitting: Internal Medicine

## 2016-04-29 DIAGNOSIS — Z1231 Encounter for screening mammogram for malignant neoplasm of breast: Secondary | ICD-10-CM | POA: Insufficient documentation

## 2016-04-29 DIAGNOSIS — Z9011 Acquired absence of right breast and nipple: Secondary | ICD-10-CM | POA: Diagnosis not present

## 2016-04-29 HISTORY — DX: Malignant neoplasm of unspecified site of unspecified female breast: C50.919

## 2016-04-29 HISTORY — DX: Personal history of antineoplastic chemotherapy: Z92.21

## 2016-04-29 HISTORY — DX: Personal history of irradiation: Z92.3

## 2016-04-30 ENCOUNTER — Encounter: Payer: Self-pay | Admitting: Internal Medicine

## 2016-05-14 ENCOUNTER — Other Ambulatory Visit: Payer: Self-pay

## 2016-05-14 MED ORDER — SIMVASTATIN 40 MG PO TABS: ORAL_TABLET | ORAL | 5 refills | Status: DC

## 2016-05-24 DIAGNOSIS — H903 Sensorineural hearing loss, bilateral: Secondary | ICD-10-CM | POA: Diagnosis not present

## 2016-05-24 DIAGNOSIS — H6123 Impacted cerumen, bilateral: Secondary | ICD-10-CM | POA: Diagnosis not present

## 2016-07-14 DIAGNOSIS — H1045 Other chronic allergic conjunctivitis: Secondary | ICD-10-CM | POA: Diagnosis not present

## 2016-07-14 DIAGNOSIS — H02834 Dermatochalasis of left upper eyelid: Secondary | ICD-10-CM | POA: Diagnosis not present

## 2016-07-14 DIAGNOSIS — H2513 Age-related nuclear cataract, bilateral: Secondary | ICD-10-CM | POA: Diagnosis not present

## 2016-08-05 DIAGNOSIS — L720 Epidermal cyst: Secondary | ICD-10-CM | POA: Diagnosis not present

## 2016-08-05 DIAGNOSIS — Z85828 Personal history of other malignant neoplasm of skin: Secondary | ICD-10-CM | POA: Diagnosis not present

## 2016-08-05 DIAGNOSIS — L304 Erythema intertrigo: Secondary | ICD-10-CM | POA: Diagnosis not present

## 2016-08-05 DIAGNOSIS — D225 Melanocytic nevi of trunk: Secondary | ICD-10-CM | POA: Diagnosis not present

## 2016-09-06 ENCOUNTER — Ambulatory Visit
Admission: EM | Admit: 2016-09-06 | Discharge: 2016-09-06 | Disposition: A | Discharge status: Home / Self Care | Payer: Medicare Other | Attending: Family Medicine | Admitting: Family Medicine

## 2016-09-06 DIAGNOSIS — S5012XA Contusion of left forearm, initial encounter: Secondary | ICD-10-CM

## 2016-09-06 DIAGNOSIS — S8002XA Contusion of left knee, initial encounter: Secondary | ICD-10-CM | POA: Diagnosis not present

## 2016-09-06 DIAGNOSIS — S50812A Abrasion of left forearm, initial encounter: Secondary | ICD-10-CM | POA: Diagnosis not present

## 2016-09-06 NOTE — ED Triage Notes (Addendum)
Pt tripped over a trailer earlier today and hit her left knee and left elbow.

## 2016-09-06 NOTE — ED Provider Notes (Signed)
[facility]    CSN: [id number] Arrival date & time: 09/06/16  1706     History   Chief Complaint Chief Complaint  Patient presents with  . Fall    HPI Lori Caldwell is a 76 y.o. female.   76 yo female with a c/o left forearm injury, skin tear (abrasion) after after tripping and falling while working in her yard today. Denies hitting her head or loss of consciousness.  Patient landed on her left forearm and left knee. Tetanus is up to date per patient.    The history is provided by the patient.  Fall     Past Medical History:  Diagnosis Date  . Breast cancer ([place]) 2000   RT MASTECTOMY  . GERD (gastroesophageal reflux disease)   . Hyperlipidemia   . Personal history of chemotherapy 2000   BREAST CA  . Personal history of radiation therapy 2000   BREAST CA    Patient Active Problem List   Diagnosis Date Noted  . Elevated blood-pressure reading without diagnosis of hypertension 03/12/2016  . Near syncope 01/16/2015  . Medicare annual wellness visit, subsequent 12/24/2012  . Osteopenia due to cancer therapy 10/22/2011  . Personal history of breast cancer 04/13/2011  . Screening for cervical cancer 04/13/2011  . Hyperlipidemia   . GERD (gastroesophageal reflux disease)     Past Surgical History:  Procedure Laterality Date  . BREAST SURGERY     mastectomy, left breast   . MASTECTOMY Right 2000   BREAST CA    OB History    No data available       Home Medications    Prior to Admission medications   Medication Sig Start Date End Date Taking? Authorizing Provider  aspirin 81 MG tablet Take 81 mg by mouth daily.     Yes [provider]  Multiple Vitamin (MULTIVITAMIN) tablet Take 1 tablet by mouth daily.     Yes [provider]  simvastatin (ZOCOR) 40 MG tablet Take 1 tablet (40 mg total) by mouth every evening. 05/14/16  Yes Crecencio Mc, MD  diazepam (VALIUM) 5 MG tablet Take 1 tablet (5 mg total) by mouth every 12  (twelve) hours as needed for anxiety. Patient not taking: Reported on 03/10/2016 01/15/15   Crecencio Mc, MD  esomeprazole (NEXIUM) 40 MG capsule Take 1 capsule (40 mg total) by mouth daily before breakfast. 09/09/14   Crecencio Mc, MD  hydrocortisone 2.5 % cream Apply topically 2 (two) times daily. 12/28/13   Crecencio Mc, MD  hydrocortisone-pramoxine [facility]) 2.5-1 % rectal cream Apply topicalyl twice daily as needed 04/13/11   Crecencio Mc, MD  meclizine (ANTIVERT) 25 MG tablet Take 1 tablet (25 mg total) by mouth 3 (three) times daily as needed for dizziness. 01/15/15   Crecencio Mc, MD  Pramoxine-HC (HYDROCORTISONE ACE-PRAMOXINE) 2.5-1 % CREA Apply topically 2 (two) times daily. 01/15/15   Crecencio Mc, MD    Family History Family History  Problem Relation Age of Onset  . Hypertension Mother   . Heart disease Father   . Cancer Maternal Grandmother   . Breast cancer Neg Hx     Social History Social History  Substance Use Topics  . Smoking status: Never Smoker  . Smokeless tobacco: Never Used  . Alcohol use Yes     Allergies   Patient has no known allergies.   Review of Systems Review of Systems   Physical Exam Triage Vital Signs ED Triage  Vitals  Enc Vitals Group     BP 09/06/16 1747 (!) 153/63     Pulse Rate 09/06/16 1747 79     Resp 09/06/16 1747 18     Temp 09/06/16 1747 98 F (36.7 C)     Temp Source 09/06/16 1747 Oral     SpO2 09/06/16 1747 100 %     Weight 09/06/16 1746 160 lb (72.6 kg)     Height 09/06/16 1746 5\' 7"  (1.702 m)     Head Circumference --      Peak Flow --      Pain Score 09/06/16 1747 6     Pain Loc --      Pain Edu? --      Excl. in [place]? --    No data found.   Updated Vital Signs BP (!) 153/63 (BP Location: Right Arm)   Pulse 79   Temp 98 F (36.7 C) (Oral)   Resp 18   Ht 5\' 7"  (1.702 m)   Wt 160 lb (72.6 kg)   SpO2 100%   BMI 25.06 kg/m   Visual Acuity Right Eye Distance:   Left Eye Distance:     Bilateral Distance:    Right Eye Near:   Left Eye Near:    Bilateral Near:     Physical Exam  Constitutional: She is oriented to person, place, and time. She appears well-developed and well-nourished. No distress.  HENT:  Head: Normocephalic and atraumatic.  Right Ear: External ear normal.  Left Ear: External ear normal.  Nose: No mucosal edema, rhinorrhea, nose lacerations, sinus tenderness, nasal deformity, septal deviation or nasal septal hematoma. No epistaxis.  No foreign bodies. Right sinus exhibits no maxillary sinus tenderness and no frontal sinus tenderness. Left sinus exhibits no maxillary sinus tenderness and no frontal sinus tenderness.  Mouth/Throat: Uvula is midline, oropharynx is clear and moist and mucous membranes are normal. No oropharyngeal exudate.  Eyes: Conjunctivae and EOM are normal. Pupils are equal, round, and reactive to light. Right eye exhibits no discharge. Left eye exhibits no discharge. No scleral icterus.  Neck: Normal range of motion. Neck supple. No thyromegaly present.  Cardiovascular: Normal rate, regular rhythm and normal heart sounds.   Pulmonary/Chest: Effort normal and breath sounds normal. No respiratory distress. She has no wheezes. She has no rales.  Musculoskeletal:       Left knee: She exhibits normal range of motion, no swelling, no effusion, no ecchymosis, no deformity, no laceration, no erythema, normal alignment, no LCL laxity, normal patellar mobility, no bony tenderness, normal meniscus and no MCL laxity. No tenderness found.       Left forearm: She exhibits no tenderness, no bony tenderness, no swelling, no edema, no deformity and no laceration.  Abrasions/skin tear to left forearm skin  Lymphadenopathy:    She has no cervical adenopathy.  Neurological: She is alert and oriented to person, place, and time. She displays normal reflexes. No cranial nerve deficit or sensory deficit. She exhibits normal muscle tone. Coordination normal.   Skin: She is not diaphoretic.  3x5 cm skin tear and abrasion to left forearm with slight bleeding controlled with pressure  Nursing note and vitals reviewed.    UC Treatments / Results  Labs (all labs ordered are listed, but only abnormal results are displayed) Labs Reviewed - No data to display  EKG  EKG Interpretation None       Radiology No results found.  Procedures Procedures (including critical care time)  Medications Ordered  in UC Medications - No data to display   Initial Impression / Assessment and Plan / UC Course  I have reviewed the triage vital signs and the nursing notes.  Pertinent labs & imaging results that were available during my care of the patient were reviewed by me and considered in my medical decision making (see chart for details).       Final Clinical Impressions(s) / UC Diagnoses   Final diagnoses:  Abrasion of left forearm, initial encounter  Contusion of left knee, initial encounter  Contusion of left forearm, initial encounter    New Prescriptions Discharge Medication List as of 09/06/2016  6:33 PM     1.diagnosis reviewed with patient 2. Wound cleaned and dressed 3. Recommend supportive treatment with otc topical antibiotic 4. Follow-up prn if symptoms worsen or don't improve   Norval Gable, MD 09/06/16 [phone]

## 2016-11-01 ENCOUNTER — Encounter: Payer: Self-pay | Admitting: Internal Medicine

## 2016-11-01 ENCOUNTER — Ambulatory Visit (INDEPENDENT_AMBULATORY_CARE_PROVIDER_SITE_OTHER): Payer: Medicare Other | Admitting: Internal Medicine

## 2016-11-01 DIAGNOSIS — M7052 Other bursitis of knee, left knee: Secondary | ICD-10-CM | POA: Diagnosis not present

## 2016-11-01 DIAGNOSIS — R2231 Localized swelling, mass and lump, right upper limb: Secondary | ICD-10-CM | POA: Diagnosis not present

## 2016-11-01 NOTE — Patient Instructions (Addendum)
The "bump:" on your right elbow appears to be a  cyst (fluid filled sac ).  The knee pain appears to be a bursitis from your recent fall  I suggest continuing to take 400 mg advil 2 to 3 times daily, along with tylenol if needed for pain (up to 2000 mg daily is the maximal dose )  And icing the knee as needed  I have made a referral to [facility] to evaluaion of both issues
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
## 2016-11-03 NOTE — Assessment & Plan Note (Signed)
Secondary to blunt trauma from a fall in May, aggravated by walking program.  Continue advil 400 mg bid,  Ice as needed .referral to orthopedics

## 2016-11-03 NOTE — Assessment & Plan Note (Signed)
Exam consistent with cyst ,  Does not appear to involve any tendons based on exam.  Will have Ortho evaluate and remove per patient request.

## 2016-11-09 DIAGNOSIS — S76112A Strain of left quadriceps muscle, fascia and tendon, initial encounter: Secondary | ICD-10-CM | POA: Diagnosis not present

## 2016-11-09 DIAGNOSIS — S76119A Strain of unspecified quadriceps muscle, fascia and tendon, initial encounter: Secondary | ICD-10-CM | POA: Insufficient documentation

## 2016-11-09 DIAGNOSIS — M71321 Other bursal cyst, right elbow: Secondary | ICD-10-CM | POA: Diagnosis not present

## 2016-11-17 ENCOUNTER — Encounter: Payer: Self-pay | Admitting: Internal Medicine

## 2016-11-17 ENCOUNTER — Ambulatory Visit (INDEPENDENT_AMBULATORY_CARE_PROVIDER_SITE_OTHER): Payer: Medicare Other | Admitting: Internal Medicine

## 2016-11-17 DIAGNOSIS — S81812A Laceration without foreign body, left lower leg, initial encounter: Secondary | ICD-10-CM | POA: Diagnosis not present

## 2016-11-17 MED ORDER — CEPHALEXIN 500 MG PO CAPS: 500.0000 mg | ORAL_CAPSULE | Freq: Four times a day (QID) | ORAL | 0 refills | Status: DC

## 2016-11-17 NOTE — Patient Instructions (Addendum)
Caring for leg wound:  1) KEEP IT COVERED EXCEPT WHEN SHOWERING , WITH A NON STICK TELFA BANDAGE DRESSING   2) RINSE WOUND AFTER SHOWERING WITH "WOUND WASH" (STERILE SALINE SOLUTION) AND BLOW DRY  OR PAT DRY BEFORE COVERING WITH A DRESSING   3) USE ONLY VASELINE ON IT FOR THE NEXT DAY OR TWO TO RESTORE MOISTURE BALANCE  4) NO SWIMMING UNTIL HEALED  5) START TAKING THE ANTIBIOTIC ONLY IF YOU NOTICE GREEN DRAINAGE ,  INCREASED TENDERNESS,  OR INCREASED RED BOUNDARY   If you start the antiobiotic,  Take a probiotic. Taking an antibiotic can create an imbalance in the normal population of bacteria that live in the small intestine.  This imbalance can persist for 3 months.   Taking a probiotic ( Align, Flora[place] or Culturelle), the generic version of one of these over the counter medications, or an alternative form (kombucha,  Yogurt, or another dietary source) for a minimum of 3 weeks may help prevent a serious antibiotic associated diarrhea  Called clostridium dificile colitis that occurs when the bacteria population is altered .  Taking a probiotic may also prevent vaginitis due to yeast infections and can be continued indefinitely if you feel that it improves your digestion or your elimination (bowels).
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
## 2016-11-20 DIAGNOSIS — S81812A Laceration without foreign body, left lower leg, initial encounter: Secondary | ICD-10-CM | POA: Insufficient documentation
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
## 2016-12-02 ENCOUNTER — Telehealth: Payer: Self-pay | Admitting: Internal Medicine

## 2016-12-02 ENCOUNTER — Telehealth: Payer: Self-pay

## 2016-12-02 MED ORDER — CEPHALEXIN 500 MG PO CAPS: 500.0000 mg | ORAL_CAPSULE | Freq: Four times a day (QID) | ORAL | 0 refills | Status: DC

## 2016-12-02 NOTE — Telephone Encounter (Signed)
Pt is out of town at [employer] and did not have the medication filled before she left. Pt would like to have it filled now. Please advise, thank you!  Pharmacy - [facility] [address] [place],  [address] Phone [phone]

## 2016-12-02 NOTE — Telephone Encounter (Signed)
If she has been swimming she should stop immediately,  Beach or pool,  Star the antibitoic

## 2016-12-02 NOTE — Telephone Encounter (Signed)
rx faxed and pt has been notified.

## 2016-12-02 NOTE — Telephone Encounter (Signed)
Spoke with pt and she stated that she was in the office last week for a leg wound and was given a rx for cehpalexin to take if the wound started draining green stuff or the tenderness got worse. [place] rx with her and her leg has now gotten worse. So the rx was sent to the pharmacy requested by the pt.

## 2016-12-02 NOTE — Telephone Encounter (Signed)
Spoke with pt and she stated that she the wound has gotten very tender now and has some yellowish red drainage on the bandaid when she takes it off. She stated that there is 2 or 3 little pus pockets in the wound.

## 2016-12-02 NOTE — Telephone Encounter (Signed)
LMTCB. Need to find out a little more information about how the wound has gotten worse.

## 2016-12-02 NOTE — Telephone Encounter (Signed)
LMTCB. Need to find out what medication needs to be filled.

## 2017-01-09 ENCOUNTER — Other Ambulatory Visit: Payer: Self-pay | Admitting: Internal Medicine

## 2017-01-20 ENCOUNTER — Telehealth: Payer: Self-pay | Admitting: Internal Medicine

## 2017-01-20 NOTE — Telephone Encounter (Signed)
Spoke to pt spouse. Pt currently not home. He will have her call me bake at [phone].  Notes *Last AWV 03/10/16; please schedule 03/11/17 or after.  Pt cancelled her 03/11/17 appt-does she want to reschedule?

## 2017-01-24 NOTE — Telephone Encounter (Signed)
Scheduled 03/17/17 °

## 2017-02-02 DIAGNOSIS — H6123 Impacted cerumen, bilateral: Secondary | ICD-10-CM | POA: Diagnosis not present

## 2017-02-02 DIAGNOSIS — H903 Sensorineural hearing loss, bilateral: Secondary | ICD-10-CM | POA: Diagnosis not present

## 2017-02-18 DIAGNOSIS — Z23 Encounter for immunization: Secondary | ICD-10-CM | POA: Diagnosis not present

## 2017-03-11 ENCOUNTER — Ambulatory Visit: Payer: BC Managed Care – PPO

## 2017-03-14 ENCOUNTER — Ambulatory Visit (INDEPENDENT_AMBULATORY_CARE_PROVIDER_SITE_OTHER): Payer: Medicare Other | Admitting: Internal Medicine

## 2017-03-14 ENCOUNTER — Encounter: Payer: Self-pay | Admitting: Internal Medicine

## 2017-03-14 VITALS — BP 122/64 | HR 79 | Temp 97.2°F | Resp 15 | Ht 66.75 in | Wt 162.8 lb

## 2017-03-14 DIAGNOSIS — R5383 Other fatigue: Secondary | ICD-10-CM

## 2017-03-14 DIAGNOSIS — Z853 Personal history of malignant neoplasm of breast: Secondary | ICD-10-CM | POA: Diagnosis not present

## 2017-03-14 DIAGNOSIS — Z1211 Encounter for screening for malignant neoplasm of colon: Secondary | ICD-10-CM | POA: Insufficient documentation

## 2017-03-14 DIAGNOSIS — B370 Candidal stomatitis: Secondary | ICD-10-CM | POA: Insufficient documentation

## 2017-03-14 DIAGNOSIS — Z1239 Encounter for other screening for malignant neoplasm of breast: Secondary | ICD-10-CM

## 2017-03-14 DIAGNOSIS — Z0000 Encounter for general adult medical examination without abnormal findings: Secondary | ICD-10-CM | POA: Insufficient documentation

## 2017-03-14 DIAGNOSIS — E7800 Pure hypercholesterolemia, unspecified: Secondary | ICD-10-CM | POA: Diagnosis not present

## 2017-03-14 LAB — COMPREHENSIVE METABOLIC PANEL
ALT: 24 U/L (ref 0–35)
AST: 27 U/L (ref 0–37)
Albumin: 4.6 g/dL (ref 3.5–5.2)
Alkaline Phosphatase: 49 U/L (ref 39–117)
BILIRUBIN TOTAL: 0.9 mg/dL (ref 0.2–1.2)
BUN: 16 mg/dL (ref 6–23)
CALCIUM: 10.2 mg/dL (ref 8.4–10.5)
CHLORIDE: 103 meq/L (ref 96–112)
CO2: 25 meq/L (ref 19–32)
Creatinine, Ser: 0.75 mg/dL (ref 0.40–1.20)
GFR: 79.98 mL/min (ref >60.00)
Glucose, Bld: 83 mg/dL (ref 70–99)
POTASSIUM: 4.3 meq/L (ref 3.5–5.1)
Sodium: 138 mEq/L (ref 135–145)
Total Protein: 7.8 g/dL (ref 6.0–8.3)

## 2017-03-14 LAB — LIPID PANEL
CHOL/HDL RATIO: 2
Cholesterol: 198 mg/dL (ref 0–200)
HDL: 80.7 mg/dL (ref >39.00)
LDL CALC: 94 mg/dL (ref 0–99)
NONHDL: 117.39
TRIGLYCERIDES: 116 mg/dL (ref 0.0–149.0)
VLDL: 23.2 mg/dL (ref 0.0–40.0)

## 2017-03-14 LAB — TSH: TSH: 3.81 u[IU]/mL (ref 0.35–4.50)

## 2017-03-14 MED ORDER — NYSTATIN 100000 UNIT/ML MT SUSP: 5.0000 mL | Freq: Four times a day (QID) | OROMUCOSAL | 0 refills | Status: DC

## 2017-03-14 NOTE — Progress Notes (Signed)
Patient ID: Lori Caldwell, female    DOB: 1940-11-18  Age: 76 y.o. MRN: [id number]  The patient is here for management  of other chronic and acute problems.  Right mastectomy 2000, annual screening mammogram for left breast is due     The risk factors are reflected in the social history.  The roster of all physicians providing medical care to patient - is listed in the Snapshot section of the chart.  Activities of daily living:  The patient is 100% independent in all ADLs: dressing, toileting, feeding as well as independent mobility.  Home safety : The patient has smoke detectors in the home. They wear seatbelts.  There are no firearms at home. There is no violence in the home.   There is no risks for hepatitis, STDs or HIV. There is no   history of blood transfusion. They have no travel history to infectious disease endemic areas of the world.  The patient has seen their dentist in the last six month. They have seen their eye doctor in the last year. They admit to slight hearing difficulty with regard to whispered voices and some television programs.  They have deferred audiologic testing in the last year.  They do not  have excessive sun exposure. Discussed the need for sun protection: hats, long sleeves and use of sunscreen if there is significant sun exposure.   Diet: the importance of a healthy diet is discussed. They do have a healthy diet.  The benefits of regular aerobic exercise were discussed. She walks 4 times per week ,  20 minutes.   Depression screen: there are no signs or vegative symptoms of depression- irritability, change in appetite, anhedonia, sadness/tearfullness.  Cognitive assessment: the patient manages all their financial and personal affairs and is actively engaged. They could relate day,date,year and events; recalled 2/3 objects at 3 minutes; performed clock-face test normally.  The following portions of the patient's history were reviewed and updated as  appropriate: allergies, current medications, past family history, past medical history,  past surgical history, past social history  and problem list.  Visual acuity was not assessed per patient preference since she has regular follow up with her ophthalmologist. Hearing and body mass index were assessed and reviewed.   During the course of the visit the patient was educated and counseled about appropriate screening and preventive services including : fall prevention , diabetes screening, nutrition counseling, colorectal cancer screening, and recommended immunizations.    CC: The primary encounter diagnosis was Screening breast examination. Diagnoses of Breast cancer screening, Pure hypercholesterolemia, Fatigue, unspecified type, Oral thrush, Personal history of breast cancer, and Colon cancer screening were also pertinent to this visit.  Took Lori Caldwell's Journey in [place] and [place] 4 miles daily.  Spent 2 days at sea  With a group called [employer]  16 days total,  Walked over rocks. At times  Leg wound finally healed this summer after the second round of antibiotics,  Became infected while at the beachn once she started the antibiotic.   Irritation roof of mouth present ofr 1-2 weeks,  Using mouthwash  History Berda has a past medical history of Breast cancer ([place]) (2000), GERD (gastroesophageal reflux disease), Hyperlipidemia, Personal history of chemotherapy (2000), and Personal history of radiation therapy (2000).   She has a past surgical history that includes Breast surgery and Mastectomy (Right, 2000).   Her family history includes Cancer in her maternal grandmother; Heart disease in her father; Hypertension in her mother.She reports  that  has never smoked. she has never used smokeless tobacco. She reports that she drinks alcohol. She reports that she does not use drugs.  Outpatient Medications Prior to Visit  Medication Sig Dispense Refill  . aspirin 81 MG tablet  Take 81 mg by mouth daily.      . Multiple Vitamin (MULTIVITAMIN) tablet Take 1 tablet by mouth daily.      . simvastatin (ZOCOR) 40 MG tablet Take 1 tablet (40 mg total) by mouth every evening. 30 tablet 0  . hydrocortisone 2.5 % cream Apply topically 2 (two) times daily. (Patient not taking: Reported on 03/14/2017) 30 g 2  . hydrocortisone-pramoxine (ANALPRAM-HC) 2.5-1 % rectal cream Apply topicalyl twice daily as needed (Patient not taking: Reported on 03/14/2017) 30 g 0  . Pramoxine-HC (HYDROCORTISONE ACE-PRAMOXINE) 2.5-1 % CREA Apply topically 2 (two) times daily. (Patient not taking: Reported on 03/14/2017) 30 g 5  . cephALEXin (KEFLEX) 500 MG capsule Take 1 capsule (500 mg total) by mouth 4 (four) times daily. (Patient not taking: Reported on 03/14/2017) 28 capsule 0   No facility-administered medications prior to visit.     Review of Systems   Patient denies headache, fevers, malaise, unintentional weight loss, skin rash, eye pain, sinus congestion and sinus pain, sore throat, dysphagia,  hemoptysis , cough, dyspnea, wheezing, chest pain, palpitations, orthopnea, edema, abdominal pain, nausea, melena, diarrhea, constipation, flank pain, dysuria, hematuria, urinary  Frequency, nocturia, numbness, tingling, seizures,  Focal weakness, Loss of consciousness,  Tremor, insomnia, depression, anxiety, and suicidal ideation.      Objective:  BP 122/64 (BP Location: Left Arm, Patient Position: Sitting, Cuff Size: Normal)   Pulse 79   Temp (!) 97.2 F (36.2 C) (Oral)   Resp 15   Ht 5' 6.75" (1.695 m)   Wt 162 lb 12.8 oz (73.8 kg)   SpO2 98%   BMI 25.69 kg/m   Physical Exam   General appearance: alert, cooperative and appears stated age Head: Normocephalic, without obvious abnormality, atraumatic Eyes: conjunctivae/corneas clear. PERRL, EOM's intact. Fundi benign. Ears: normal TM's and external ear canals both ears Nose: Nares normal. Septum midline. Mucosa normal. No drainage or sinus  tenderness. Throat: lips, mucosa, and tongue normal; soft and hard palate with erythematus macules Neck: no adenopathy, no carotid bruit, no JVD, supple, symmetrical, trachea midline and thyroid not enlarged, symmetric, no tenderness/mass/nodules Lungs: clear to auscultation bilaterally Breasts: right mastectomy with scar tissue and thickening in right upper outer quadrant,  Left breast normal appearance, no masses or tenderness Heart: regular rate and rhythm, S1, S2 normal, no murmur, click, rub or gallop Abdomen: soft, non-tender; bowel sounds normal; no masses,  no organomegaly Extremities: extremities normal, atraumatic, no cyanosis or edema Pulses: 2+ and symmetric Skin: Skin color, texture, turgor normal. No rashes or lesions Neurologic: Alert and oriented X 3, normal strength and tone. Normal symmetric reflexes. Normal coordination and gait.      Assessment & Plan:   Problem List Items Addressed This Visit    Colon cancer screening    She is at average risk and has had colonoscopy in 2008. Cologuard discussed       Hyperlipidemia    Managed with zocor.  LDL and triglycerides are at goal on current medications. She has no side effects and liver enzymes are normal. No changes today  Lab Results  Component Value Date   CHOL 198 03/14/2017   HDL 80.70 03/14/2017   LDLCALC 94 03/14/2017   LDLDIRECT 117.8 06/25/2013  TRIG 116.0 03/14/2017   CHOLHDL 2 03/14/2017   Lab Results  Component Value Date   ALT 24 03/14/2017   AST 27 03/14/2017   ALKPHOS 49 03/14/2017   BILITOT 0.9 03/14/2017         Relevant Orders   Lipid panel (Completed)   Oral thrush    Trial of nystatin suspension       Relevant Medications   nystatin (MYCOSTATIN) 100000 UNIT/ML suspension   Personal history of breast cancer    Screening mammogram for left breast ordered        Other Visit Diagnoses    Screening breast examination    -  Primary   Relevant Orders   MM Digital Screening Unilat  L   Breast cancer screening       Fatigue, unspecified type       Relevant Orders   Comprehensive metabolic panel (Completed)   TSH (Completed)     A total of 40 minutes was spent with patient more than half of which was spent in counseling patient on the above mentioned issues , reviewing and explaining recent labs and imaging studies done, and coordination of care.  I have discontinued Damaris Hippo. Cohrs's cephALEXin. I am also having her start on nystatin. Additionally, I am having her maintain her aspirin, multivitamin, hydrocortisone-pramoxine, hydrocortisone, Hydrocortisone Ace-Pramoxine, and simvastatin.  Meds ordered this encounter  Medications  . nystatin (MYCOSTATIN) 100000 UNIT/ML suspension    Sig: Take 5 mLs (500,000 Units total) 4 (four) times daily by mouth.    Dispense:  60 mL    Refill:  0    Medications Discontinued During This Encounter  Medication Reason  . cephALEXin (KEFLEX) 500 MG capsule Completed Course    Follow-up: No Follow-up on file.   Crecencio Mc, MD

## 2017-03-14 NOTE — Assessment & Plan Note (Signed)
She is at average risk and has had colonoscopy in 2008. Cologuard discussed

## 2017-03-14 NOTE — Assessment & Plan Note (Signed)
Screening mammogram for left breast ordered

## 2017-03-14 NOTE — Patient Instructions (Addendum)
Mammogram has been ordered  DEXA scan is due next year  Trial of Nystatin swish and swallow suspension for your mouth    I will initiate the order for your annual colon  cancer screening  Test.  It is called  Cologuard.  It will be delivered to your house, and you will send off a stool sample in the envelope it provides.  , The ShingRx vaccine is now available in local pharmacies and is much more protective thant Zostavaxs,  It is therefore ADVISED for all interested adults over 81 to prevent shingles    Health Maintenance for Postmenopausal Women Menopause is a normal process in which your reproductive ability comes to an end. This process happens gradually over a span of months to years, usually between the ages of 9 and 62. Menopause is complete when you have missed 12 consecutive menstrual periods. It is important to talk with your health care provider about some of the most common conditions that affect postmenopausal women, such as heart disease, cancer, and bone loss (osteoporosis). Adopting a healthy lifestyle and getting preventive care can help to promote your health and wellness. Those actions can also lower your chances of developing some of these common conditions. What should I know about menopause? During menopause, you may experience a number of symptoms, such as:  Moderate-to-severe hot flashes.  Night sweats.  Decrease in sex drive.  Mood swings.  Headaches.  Tiredness.  Irritability.  Memory problems.  Insomnia.  Choosing to treat or not to treat menopausal changes is an individual decision that you make with your health care provider. What should I know about hormone replacement therapy and supplements? Hormone therapy products are effective for treating symptoms that are associated with menopause, such as hot flashes and night sweats. Hormone replacement carries certain risks, especially as you become older. If you are thinking about using estrogen or  estrogen with progestin treatments, discuss the benefits and risks with your health care provider. What should I know about heart disease and stroke? Heart disease, heart attack, and stroke become more likely as you age. This may be due, in part, to the hormonal changes that your body experiences during menopause. These can affect how your body processes dietary fats, triglycerides, and cholesterol. Heart attack and stroke are both medical emergencies. There are many things that you can do to help prevent heart disease and stroke:  Have your blood pressure checked at least every 1-2 years. High blood pressure causes heart disease and increases the risk of stroke.  If you are 66-42 years old, ask your health care provider if you should take aspirin to prevent a heart attack or a stroke.  Do not use any tobacco products, including cigarettes, chewing tobacco, or electronic cigarettes. If you need help quitting, ask your health care provider.  It is important to eat a healthy diet and maintain a healthy weight. ? Be sure to include plenty of vegetables, fruits, low-fat dairy products, and lean protein. ? Avoid eating foods that are high in solid fats, added sugars, or salt (sodium).  Get regular exercise. This is one of the most important things that you can do for your health. ? Try to exercise for at least 150 minutes each week. The type of exercise that you do should increase your heart rate and make you sweat. This is known as moderate-intensity exercise. ? Try to do strengthening exercises at least twice each week. Do these in addition to the moderate-intensity exercise.  Know your  numbers.Ask your health care provider to check your cholesterol and your blood glucose. Continue to have your blood tested as directed by your health care provider.  What should I know about cancer screening? There are several types of cancer. Take the following steps to reduce your risk and to catch any cancer  development as early as possible. Breast Cancer  Practice breast self-awareness. ? This means understanding how your breasts normally appear and feel. ? It also means doing regular breast self-exams. Let your health care provider know about any changes, no matter how small.  If you are 34 or older, have a clinician do a breast exam (clinical breast exam or CBE) every year. Depending on your age, family history, and medical history, it may be recommended that you also have a yearly breast X-ray (mammogram).  If you have a family history of breast cancer, talk with your health care provider about genetic screening.  If you are at high risk for breast cancer, talk with your health care provider about having an MRI and a mammogram every year.  Breast cancer (BRCA) gene test is recommended for women who have family members with BRCA-related cancers. Results of the assessment will determine the need for genetic counseling and BRCA1 and for BRCA2 testing. BRCA-related cancers include these types: ? Breast. This occurs in males or females. ? Ovarian. ? Tubal. This may also be called fallopian tube cancer. ? Cancer of the abdominal or pelvic lining (peritoneal cancer). ? Prostate. ? Pancreatic.  Cervical, Uterine, and Ovarian Cancer Your health care provider may recommend that you be screened regularly for cancer of the pelvic organs. These include your ovaries, uterus, and vagina. This screening involves a pelvic exam, which includes checking for microscopic changes to the surface of your cervix (Pap test).  For women ages 21-65, health care providers may recommend a pelvic exam and a Pap test every three years. For women ages 48-65, they may recommend the Pap test and pelvic exam, combined with testing for human papilloma virus (HPV), every five years. Some types of HPV increase your risk of cervical cancer. Testing for HPV may also be done on women of any age who have unclear Pap test  results.  Other health care providers may not recommend any screening for nonpregnant women who are considered low risk for pelvic cancer and have no symptoms. Ask your health care provider if a screening pelvic exam is right for you.  If you have had past treatment for cervical cancer or a condition that could lead to cancer, you need Pap tests and screening for cancer for at least 20 years after your treatment. If Pap tests have been discontinued for you, your risk factors (such as having a new sexual partner) need to be reassessed to determine if you should start having screenings again. Some women have medical problems that increase the chance of getting cervical cancer. In these cases, your health care provider may recommend that you have screening and Pap tests more often.  If you have a family history of uterine cancer or ovarian cancer, talk with your health care provider about genetic screening.  If you have vaginal bleeding after reaching menopause, tell your health care provider.  There are currently no reliable tests available to screen for ovarian cancer.  Lung Cancer Lung cancer screening is recommended for adults 68-74 years old who are at high risk for lung cancer because of a history of smoking. A yearly low-dose CT scan of the lungs is  recommended if you:  Currently smoke.  Have a history of at least 30 pack-years of smoking and you currently smoke or have quit within the past 15 years. A pack-year is smoking an average of one pack of cigarettes per day for one year.  Yearly screening should:  Continue until it has been 15 years since you quit.  Stop if you develop a health problem that would prevent you from having lung cancer treatment.  Colorectal Cancer  This type of cancer can be detected and can often be prevented.  Routine colorectal cancer screening usually begins at age 65 and continues through age 48.  If you have risk factors for colon cancer, your health  care provider may recommend that you be screened at an earlier age.  If you have a family history of colorectal cancer, talk with your health care provider about genetic screening.  Your health care provider may also recommend using home test kits to check for hidden blood in your stool.  A small camera at the end of a tube can be used to examine your colon directly (sigmoidoscopy or colonoscopy). This is done to check for the earliest forms of colorectal cancer.  Direct examination of the colon should be repeated every 5-10 years until age 10. However, if early forms of precancerous polyps or small growths are found or if you have a family history or genetic risk for colorectal cancer, you may need to be screened more often.  Skin Cancer  Check your skin from head to toe regularly.  Monitor any moles. Be sure to tell your health care provider: ? About any new moles or changes in moles, especially if there is a change in a mole's shape or color. ? If you have a mole that is larger than the size of a pencil eraser.  If any of your family members has a history of skin cancer, especially at a young age, talk with your health care provider about genetic screening.  Always use sunscreen. Apply sunscreen liberally and repeatedly throughout the day.  Whenever you are outside, protect yourself by wearing long sleeves, pants, a wide-brimmed hat, and sunglasses.  What should I know about osteoporosis? Osteoporosis is a condition in which bone destruction happens more quickly than new bone creation. After menopause, you may be at an increased risk for osteoporosis. To help prevent osteoporosis or the bone fractures that can happen because of osteoporosis, the following is recommended:  If you are 67-31 years old, get at least 1,000 mg of calcium and at least 600 mg of vitamin D per day.  If you are older than age 68 but younger than age 79, get at least 1,200 mg of calcium and at least 600 mg of  vitamin D per day.  If you are older than age 25, get at least 1,200 mg of calcium and at least 800 mg of vitamin D per day.  Smoking and excessive alcohol intake increase the risk of osteoporosis. Eat foods that are rich in calcium and vitamin D, and do weight-bearing exercises several times each week as directed by your health care provider. What should I know about how menopause affects my mental health? Depression may occur at any age, but it is more common as you become older. Common symptoms of depression include:  Low or sad mood.  Changes in sleep patterns.  Changes in appetite or eating patterns.  Feeling an overall lack of motivation or enjoyment of activities that you previously enjoyed.  Frequent crying spells.  Talk with your health care provider if you think that you are experiencing depression. What should I know about immunizations? It is important that you get and maintain your immunizations. These include:  Tetanus, diphtheria, and pertussis (Tdap) booster vaccine.  Influenza every year before the flu season begins.  Pneumonia vaccine.  Shingles vaccine.  Your health care provider may also recommend other immunizations. This information is not intended to replace advice given to you by your health care provider. Make sure you discuss any questions you have with your health care provider. Document Released: 06/11/2005 Document Revised: 11/07/2015 Document Reviewed: 01/21/2015 Elsevier Interactive Patient Education  2018 [employer].

## 2017-03-14 NOTE — Assessment & Plan Note (Signed)
Managed with zocor.  LDL and triglycerides are at goal on current medications. She has no side effects and liver enzymes are normal. No changes today  Lab Results  Component Value Date   CHOL 198 03/14/2017   HDL 80.70 03/14/2017   LDLCALC 94 03/14/2017   LDLDIRECT 117.8 06/25/2013   TRIG 116.0 03/14/2017   CHOLHDL 2 03/14/2017   Lab Results  Component Value Date   ALT 24 03/14/2017   AST 27 03/14/2017   ALKPHOS 49 03/14/2017   BILITOT 0.9 03/14/2017

## 2017-03-14 NOTE — Assessment & Plan Note (Signed)
Trial of nystatin suspension

## 2017-03-16 ENCOUNTER — Other Ambulatory Visit: Payer: Self-pay

## 2017-03-16 ENCOUNTER — Encounter: Payer: Self-pay | Admitting: Internal Medicine

## 2017-03-16 MED ORDER — SIMVASTATIN 40 MG PO TABS: ORAL_TABLET | ORAL | 2 refills | Status: DC

## 2017-03-17 ENCOUNTER — Ambulatory Visit: Payer: BC Managed Care – PPO

## 2017-04-05 ENCOUNTER — Ambulatory Visit: Payer: BC Managed Care – PPO

## 2017-05-09 ENCOUNTER — Ambulatory Visit: Payer: BC Managed Care – PPO

## 2017-05-12 ENCOUNTER — Ambulatory Visit
Admission: RE | Admit: 2017-05-12 | Discharge: 2017-05-12 | Disposition: A | Discharge status: Home / Self Care | Payer: Medicare Other | Source: Ambulatory Visit | Attending: Internal Medicine | Admitting: Internal Medicine

## 2017-05-12 DIAGNOSIS — Z1239 Encounter for other screening for malignant neoplasm of breast: Secondary | ICD-10-CM

## 2017-05-12 DIAGNOSIS — Z1231 Encounter for screening mammogram for malignant neoplasm of breast: Secondary | ICD-10-CM | POA: Insufficient documentation

## 2017-05-24 ENCOUNTER — Encounter: Payer: Self-pay | Admitting: Internal Medicine

## 2017-05-31 ENCOUNTER — Ambulatory Visit (INDEPENDENT_AMBULATORY_CARE_PROVIDER_SITE_OTHER): Payer: Medicare Other

## 2017-05-31 ENCOUNTER — Other Ambulatory Visit: Payer: Self-pay

## 2017-05-31 VITALS — BP 122/62 | HR 75 | Temp 97.5°F | Resp 14 | Ht 66.75 in | Wt 167.4 lb

## 2017-05-31 DIAGNOSIS — Z0000 Encounter for general adult medical examination without abnormal findings: Secondary | ICD-10-CM

## 2017-05-31 DIAGNOSIS — Z1331 Encounter for screening for depression: Secondary | ICD-10-CM

## 2017-05-31 NOTE — Progress Notes (Signed)
Subjective:   Lori Caldwell is a 77 y.o. female who presents for Medicare Annual (Subsequent) preventive examination.  Review of Systems:  No ROS.  Medicare Wellness Visit. Additional risk factors are reflected in the social history.  Cardiac Risk Factors include: advanced age (>58men, >32 women)     Objective:     Vitals: BP 122/62 (BP Location: Left Arm, Patient Position: Sitting, Cuff Size: Normal)   Pulse 75   Temp (!) 97.5 F (36.4 C) (Oral)   Resp 14   Ht 5' 6.75" (1.695 m)   Wt 167 lb 6.4 oz (75.9 kg)   SpO2 100%   BMI 26.42 kg/m   Body mass index is 26.42 kg/m.  Advanced Directives 05/31/2017 09/06/2016  Does Patient Have a Medical Advance Directive? Yes No  Type of Paramedic of [place];Living will -  Does patient want to make changes to medical advance directive? No - Patient declined -  Copy of [place] in Chart? No - copy requested -    Tobacco Social History   Tobacco Use  Smoking Status Never Smoker  Smokeless Tobacco Never Used     Counseling given: Not Answered   Clinical Intake:  Pre-visit preparation completed: Yes  Pain : No/denies pain     Nutritional Status: BMI 25 -29 Overweight Diabetes: No  How often do you need to have someone help you when you read instructions, pamphlets, or other written materials from your doctor or pharmacy?: 1 - Never  Interpreter Needed?: No     Past Medical History:  Diagnosis Date  . Breast cancer ([place]) 2000   RT MASTECTOMY  . GERD (gastroesophageal reflux disease)   . Hyperlipidemia   . Personal history of chemotherapy 2000   BREAST CA  . Personal history of radiation therapy 2000   BREAST CA   Past Surgical History:  Procedure Laterality Date  . BREAST SURGERY     mastectomy, left breast   . MASTECTOMY Right 2000   BREAST CA   Family History  Problem Relation Age of Onset  . Hypertension Mother   . Heart disease Father   . Cancer  Maternal Grandmother   . Breast cancer Neg Hx    Social History   Socioeconomic History  . Marital status: Married    Spouse name: None  . Number of children: None  . Years of education: None  . Highest education level: None  Social Needs  . Financial resource strain: None  . Food insecurity - worry: None  . Food insecurity - inability: None  . Transportation needs - medical: None  . Transportation needs - non-medical: None  Occupational History  . None  Tobacco Use  . Smoking status: Never Smoker  . Smokeless tobacco: Never Used  Substance and Sexual Activity  . Alcohol use: Yes    Comment: OCC  . Drug use: No  . Sexual activity: None  Other Topics Concern  . None  Social History Narrative  . None    Outpatient Encounter Medications as of 05/31/2017  Medication Sig  . aspirin 81 MG tablet Take 81 mg by mouth daily.    . hydrocortisone 2.5 % cream Apply topically 2 (two) times daily.  . hydrocortisone-pramoxine (ANALPRAM-HC) 2.5-1 % rectal cream Apply topicalyl twice daily as needed  . Multiple Vitamin (MULTIVITAMIN) tablet Take 1 tablet by mouth daily.    Marland Kitchen nystatin (MYCOSTATIN) 100000 UNIT/ML suspension Take 5 mLs (500,000 Units total) 4 (four) times daily  by mouth.  . Pramoxine-HC (HYDROCORTISONE ACE-PRAMOXINE) 2.5-1 % CREA Apply topically 2 (two) times daily.  . simvastatin (ZOCOR) 40 MG tablet Take 1 tablet (40 mg total) by mouth every evening.   No facility-administered encounter medications on file as of 05/31/2017.     Activities of Daily Living In your present state of health, do you have any difficulty performing the following activities: 05/31/2017  Hearing? Y  Vision? N  Difficulty concentrating or making decisions? N  Walking or climbing stairs? Y  Comment R knee pain, intermittent  Dressing or bathing? N  Doing errands, shopping? N  Preparing Food and eating ? N  Using the Toilet? N  In the past six months, have you accidently leaked urine? N  Do  you have problems with loss of bowel control? N  Managing your Medications? N  Managing your Finances? N  Comment Husband manages the finances, however she is capable  Housekeeping or managing your Housekeeping? N  Some recent data might be hidden    Patient Care Team: Crecencio Mc, MD as PCP - General (Internal Medicine)    Assessment:   This is a routine wellness examination for [facility]. The goal of the wellness visit is to assist the patient how to close the gaps in care and create a preventative care plan for the patient.   The roster of all physicians providing medical care to patient is listed in the Snapshot section of the chart.  Osteoporosis risk reviewed.    Safety issues reviewed; Smoke and carbon monoxide detectors in the home. No firearms in the home.  Wears seatbelts when driving or riding with others. Patient does wear sunscreen or protective clothing when in direct sunlight. No violence in the home.  Depression- PHQ 2 &9 complete.  No signs/symptoms or verbal communication regarding little pleasure in doing things, feeling down, depressed or hopeless. No changes in sleeping, energy, eating, concentrating.  No thoughts of self harm or harm towards others.  Time spent on this topic is 10 minutes.   Patient is alert, normal appearance, oriented to person/place/and time. Correctly identified the president of the [place], recall of 3/3 words, and performing simple calculations. Displays appropriate judgement and can read correct time from watch face.   No new identified risk were noted.  No failures at ADL's or IADL's.    BMI- discussed the importance of a healthy diet, water intake and the benefits of aerobic exercise. Educational material provided.   24 hour diet recall: Regular diet  Daily fluid intake: 1 cups of caffeine, 6-8 cups of water  Dental- every 6 months.  Eye- Visual acuity not assessed per patient preference since they have regular follow up with the  ophthalmologist.  Wears corrective lenses.  Sleep patterns- Sleeps 6 hours at night.  Wakes feeling rested.  Cologuard ordered; follow as directed.  Educational material provided.  Patient Concerns: None at this time. Follow up with PCP as needed.  Exercise Activities and Dietary recommendations Current Exercise Habits: Home exercise routine, Type of exercise: walking(Chair/standing exercises), Time (Minutes): 30, Frequency (Times/Week): 2, Weekly Exercise (Minutes/Week): 60, Intensity: Mild  Goals    . Maintain Healthy Lifestyle     Low carb foods, portion control Stay hydrated Exercise       Fall Risk Fall Risk  05/31/2017 11/01/2016 04/08/2016 01/16/2015 12/28/2013  Falls in the past year? No Yes No No No  Comment - - Emmi Telephone Survey: data to providers prior to load - -  Number falls  in past yr: - 1 - - -  Injury with Fall? - Yes - - -  Follow up - Falls evaluation completed - - -   Depression Screen PHQ 2/9 Scores 05/31/2017 11/01/2016 01/16/2015 12/28/2013  PHQ - 2 Score 0 0 0 0  PHQ- 9 Score 0 - - -     Cognitive Function MMSE - Mini Mental State Exam 05/31/2017  Orientation to time 5  Orientation to Place 5  Registration 3  Attention/ Calculation 5  Recall 3  Language- name 2 objects 2  Language- repeat 1  Language- follow 3 step command 3  Language- read & follow direction 1  Write a sentence 1  Copy design 1  Total score 30        Immunization History  Administered Date(s) Administered  . Influenza Split 02/19/2011  . Influenza,inj,Quad PF,6+ Mos 01/15/2015  . Influenza-Unspecified 03/03/2013, 04/02/2014, 02/17/2016, 02/07/2017  . Pneumococcal Conjugate-13 12/28/2013  . Pneumococcal Polysaccharide-23 12/22/2007, 12/21/2012  . Tdap 11/21/2011  . Zoster 11/20/2012   Screening Tests Health Maintenance  Topic Date Due  . Fecal DNA (Cologuard)  10/18/1991  . TETANUS/TDAP  11/20/2021  . INFLUENZA VACCINE  Completed  . DEXA SCAN  Completed  . PNA vac  Low Risk Adult  Completed      Plan:    End of life planning; Advance aging; Advanced directives discussed. Copy of current HCPOA/Living Will requested.    I have personally reviewed and noted the following in the patient's chart:   . Medical and social history . Use of alcohol, tobacco or illicit drugs  . Current medications and supplements . Functional ability and status . Nutritional status . Physical activity . Advanced directives . List of other physicians . Hospitalizations, surgeries, and ER visits in previous 12 months . Vitals . Screenings to include cognitive, depression, and falls . Referrals and appointments  In addition, I have reviewed and discussed with patient certain preventive protocols, quality metrics, and best practice recommendations. A written personalized care plan for preventive services as well as general preventive health recommendations were provided to patient.     OBrien-Blaney, Jakirah Zaun L, LPN  [id number]/93/[id number]    I have reviewed the above information and agree with above.   Deborra Medina, MD

## 2017-05-31 NOTE — Patient Instructions (Addendum)
  Lori Caldwell , Thank you for taking time to come for your Medicare Wellness Visit. I appreciate your ongoing commitment to your health goals. Please review the following plan we discussed and let me know if I can assist you in the future.   Follow up with Dr. Derrel Nip as needed.    Bring a copy of your [place] and/or Living Will to be scanned into chart.  Cologuard ordered; follow as directed.  Have a great day!  These are the goals we discussed: Goals    . Maintain Healthy Lifestyle     Low carb foods, portion control Stay hydrated Exercise       This is a list of the screening recommended for you and due dates:  Health Maintenance  Topic Date Due  . Cologuard (Stool DNA test)  10/18/1991  . Tetanus Vaccine  11/20/2021  . Flu Shot  Completed  . DEXA scan (bone density measurement)  Completed  . Pneumonia vaccines  Completed

## 2017-06-08 DIAGNOSIS — Z1212 Encounter for screening for malignant neoplasm of rectum: Secondary | ICD-10-CM | POA: Diagnosis not present

## 2017-06-08 DIAGNOSIS — Z1211 Encounter for screening for malignant neoplasm of colon: Secondary | ICD-10-CM | POA: Diagnosis not present

## 2017-06-09 LAB — COLOGUARD: Cologuard: NEGATIVE

## 2017-06-18 ENCOUNTER — Telehealth: Payer: Self-pay | Admitting: Internal Medicine

## 2017-06-18 NOTE — Telephone Encounter (Signed)
The results of patient's cologuard is negative. Results abstracted and MyChart message sent  

## 2017-06-22 DIAGNOSIS — H903 Sensorineural hearing loss, bilateral: Secondary | ICD-10-CM | POA: Diagnosis not present

## 2017-06-22 DIAGNOSIS — H6123 Impacted cerumen, bilateral: Secondary | ICD-10-CM | POA: Diagnosis not present

## 2017-07-13 ENCOUNTER — Encounter: Payer: Self-pay | Admitting: Internal Medicine

## 2017-07-13 ENCOUNTER — Ambulatory Visit (INDEPENDENT_AMBULATORY_CARE_PROVIDER_SITE_OTHER): Payer: Medicare Other | Admitting: Internal Medicine

## 2017-07-13 VITALS — BP 154/70 | HR 81 | Temp 97.7°F | Ht 66.75 in | Wt 168.2 lb

## 2017-07-13 DIAGNOSIS — H6120 Impacted cerumen, unspecified ear: Secondary | ICD-10-CM

## 2017-07-13 DIAGNOSIS — J020 Streptococcal pharyngitis: Secondary | ICD-10-CM | POA: Diagnosis not present

## 2017-07-13 DIAGNOSIS — R0982 Postnasal drip: Secondary | ICD-10-CM

## 2017-07-13 DIAGNOSIS — J029 Acute pharyngitis, unspecified: Secondary | ICD-10-CM | POA: Diagnosis not present

## 2017-07-13 LAB — POCT RAPID STREP A (OFFICE): RAPID STREP A SCREEN: POSITIVE — AB

## 2017-07-13 LAB — POCT INFLUENZA A/B
INFLUENZA A, POC: NEGATIVE
Influenza B, POC: NEGATIVE

## 2017-07-13 MED ORDER — AZITHROMYCIN 250 MG PO TABS: ORAL_TABLET | ORAL | 0 refills | Status: DC

## 2017-07-13 MED ORDER — AZELASTINE HCL 0.15 % NA SOLN: 2.0000 | Freq: Every day | NASAL | 2 refills | Status: DC | PRN

## 2017-07-13 NOTE — Progress Notes (Signed)
Pre visit review using our clinic review tool, if applicable. No additional management support is needed unless otherwise documented below in the visit note. 

## 2017-07-13 NOTE — Progress Notes (Signed)
Chief Complaint  Patient presents with  . Sore Throat   Sore throat, cough, postnasal drainage since Saturday 7/10 tried warm salt water gargles otc delsym, zyrtec and benadryl. Denies fever overall feeling well. 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    Review of Systems  Constitutional: Negative for fever.  HENT: Positive for sore throat.   Eyes: Negative for blurred vision.  Respiratory: Positive for cough. Negative for shortness of breath.   Cardiovascular: Negative for chest pain.  Musculoskeletal: Negative for myalgias.  Skin: Negative for rash.   Past Medical History:  Diagnosis Date  . Breast cancer ([place]) 2000   RT MASTECTOMY  . GERD (gastroesophageal reflux disease)   . Hyperlipidemia   . Personal history of chemotherapy 2000   BREAST CA  . Personal history of radiation therapy 2000   BREAST CA   Past Surgical History:  Procedure Laterality Date  . BREAST SURGERY     mastectomy, left breast   . MASTECTOMY Right 2000   BREAST CA   Family History  Problem Relation Age of Onset  . Hypertension Mother   . Heart disease Father   . Cancer Maternal Grandmother   . Breast cancer Neg Hx    Social History   Socioeconomic History  . Marital status: Married    Spouse name: Not on file  . Number of children: Not on file  . Years of education: Not on file  . Highest education level: Not on file  Social Needs  . Financial resource strain: Not on file  . Food insecurity - worry: Not on file  . Food insecurity - inability: Not on file  . Transportation needs - medical: Not on file  . Transportation needs - non-medical: Not on file  Occupational History  . Not on file  Tobacco Use  . Smoking status: Never Smoker  . Smokeless tobacco: Never Used  Substance and Sexual Activity  . Alcohol use: Yes    Comment: OCC  . Drug use: No  . Sexual activity: Not on file  Other Topics Concern  . Not on file  Social History Narrative   Married    Pensions consultant    No outpatient medications have been marked as taking for the 07/13/17 encounter (Office Visit) with McLean-Scocuzza, Nino Glow, MD.   No Known Allergies Recent Results (from the past 2160 hour(s))  Cologuard     Status: None   Collection Time: 06/09/17 12:00 AM  Result Value Ref Range   Cologuard Negative   POCT rapid strep A     Status: Abnormal   Collection Time: 07/13/17 12:28 PM  Result Value Ref Range   Rapid Strep A Screen Positive (A) Negative  POCT Influenza A/B     Status: None   Collection Time: 07/13/17 12:35 PM  Result Value Ref Range   Influenza A, POC Negative Negative   Influenza B, POC Negative Negative   Objective  Body mass index is 26.54 kg/m. Wt Readings from Last 3 Encounters:  07/13/17 168 lb 3.2 oz (76.3 kg)  05/31/17 167 lb 6.4 oz (75.9 kg)  03/14/17 162 lb 12.8 oz (73.8 kg)   Temp Readings from Last 3 Encounters:  07/13/17 97.7 F (36.5 C) (Oral)  05/31/17 (!) 97.5 F (36.4 C) (Oral)  03/14/17 (!) 97.2 F (36.2 C) (Oral)   BP Readings from Last 3 Encounters:  07/13/17 (!) 154/70  05/31/17 122/62  03/14/17 122/64   Pulse Readings from Last 3 Encounters:  07/13/17 81  05/31/17 75  03/14/17 79   O2 sat room air 99%  Physical Exam  Constitutional: She is oriented to person, place, and time and well-developed, well-nourished, and in no distress. Vital signs are normal.  HENT:  Head: Normocephalic and atraumatic.  Mouth/Throat: Oropharynx is clear and moist and mucous membranes are normal.  Eyes: Conjunctivae are normal. Pupils are equal, round, and reactive to light.  Cardiovascular: Normal rate, regular rhythm and normal heart sounds.  Pulmonary/Chest: Effort normal and breath sounds normal.  Neurological: She is alert and oriented to person, place, and time. Gait normal. Gait normal.  Skin: Skin is warm, dry and intact.  Psychiatric: Mood, memory, affect and judgment normal.  Nursing note and vitals reviewed. ears with mild  cerumen b/l w/o hearing aids today-note she follows Dr. Tami Ribas q4 months to get ears cleaned   Assessment   1. Sore throat + strep neg flu  2. Postnasal drip  3. Chronic cerumen impaction  Plan  1. Supportive care  Warm salt water gargles, tea with honey and lemon, cepacol/succretts Can try mucinex dm green label or robitussin dm for cough Rx zpack previously tolerated  2. Takes zyrtec qd pt takes in am and does not make sleepy  Trial of astepro to see if this helps. rec only use afrin prn x 3 days but not chronic use  Was doing zyrtec and benadryl (day and night) disc risk using benadryl ?77 y.o  3. F/u ENT Dr. Tami Ribas disc debrox ear drops today   Provider: Dr. Olivia Mackie McLean-Scocuzza-Internal Medicine

## 2017-07-13 NOTE — Patient Instructions (Addendum)
F/u in 1 week if not better  Also Cepacol drops/Succrets for sore throat   Carbamide Peroxide ear solution/Debrox ear drops for wax build up  What is this medicine? CARBAMIDE PEROXIDE (CAR bah mide per OX ide) is used to soften and help remove ear wax. This medicine may be used for other purposes; ask your health care provider or pharmacist if you have questions. COMMON BRAND NAME(S): Auro Ear, Auro Earache Relief, Debrox, Ear Drops, Ear Wax Removal, Ear Wax Remover, Earwax Treatment, Murine, Thera-Ear What should I tell my health care provider before I take this medicine? They need to know if you have any of these conditions: -dizziness -ear discharge -ear pain, irritation or rash -infection -perforated eardrum (hole in eardrum) -an unusual or allergic reaction to carbamide peroxide, glycerin, hydrogen peroxide, other medicines, foods, dyes, or preservatives -pregnant or trying to get pregnant -breast-feeding How should I use this medicine? This medicine is only for use in the outer ear canal. Follow the directions carefully. Wash hands before and after use. The solution may be warmed by holding the bottle in the hand for 1 to 2 minutes. Lie with the affected ear facing upward. Place the proper number of drops into the ear canal. After the drops are instilled, remain lying with the affected ear upward for 5 minutes to help the drops stay in the ear canal. A cotton ball may be gently inserted at the ear opening for no longer than 5 to 10 minutes to ensure retention. Repeat, if necessary, for the opposite ear. Do not touch the tip of the dropper to the ear, fingertips, or other surface. Do not rinse the dropper after use. Keep container tightly closed. Talk to your pediatrician regarding the use of this medicine in children. While this drug may be used in children as young as 12 years for selected conditions, precautions do apply. Overdosage: If you think you have taken too much of this medicine  contact a poison control center or emergency room at once. NOTE: This medicine is only for you. Do not share this medicine with others. What if I miss a dose? If you miss a dose, use it as soon as you can. If it is almost time for your next dose, use only that dose. Do not use double or extra doses. What may interact with this medicine? Interactions are not expected. Do not use any other ear products without asking your doctor or health care professional. This list may not describe all possible interactions. Give your health care provider a list of all the medicines, herbs, non-prescription drugs, or dietary supplements you use. Also tell them if you smoke, drink alcohol, or use illegal drugs. Some items may interact with your medicine. What should I watch for while using this medicine? This medicine is not for long-term use. Do not use for more than 4 days without checking with your health care professional. Contact your doctor or health care professional if your condition does not start to get better within a few days or if you notice burning, redness, itching or swelling. What side effects may I notice from receiving this medicine? Side effects that you should report to your doctor or health care professional as soon as possible: -allergic reactions like skin rash, itching or hives, swelling of the face, lips, or tongue -burning, itching, and redness -worsening ear pain -rash Side effects that usually do not require medical attention (report to your doctor or health care professional if they continue or are bothersome): -  abnormal sensation while putting the drops in the ear -temporary reduction in hearing (but not complete loss of hearing) This list may not describe all possible side effects. Call your doctor for medical advice about side effects. You may report side effects to FDA at 1-800-FDA-1088. Where should I keep my medicine? Keep out of the reach of children. Store at room temperature  between 15 and 30 degrees C (59 and 86 degrees F) in a tight, light-resistant container. Keep bottle away from excessive heat and direct sunlight. Throw away any unused medicine after the expiration date. NOTE: This sheet is a summary. It may not cover all possible information. If you have questions about this medicine, talk to your doctor, pharmacist, or health care provider.  2018 Elsevier/Gold Standard (2007-08-01 14:00:02)  Earwax Buildup, Adult The ears produce a substance called earwax that helps keep bacteria out of the ear and protects the skin in the ear canal. Occasionally, earwax can build up in the ear and cause discomfort or hearing loss. What increases the risk? This condition is more likely to develop in people who:  Are female.  Are elderly.  Naturally produce more earwax.  Clean their ears often with cotton swabs.  Use earplugs often.  Use in-ear headphones often.  Wear hearing aids.  Have narrow ear canals.  Have earwax that is overly thick or sticky.  Have eczema.  Are dehydrated.  Have excess hair in the ear canal.  What are the signs or symptoms? Symptoms of this condition include:  Reduced or muffled hearing.  A feeling of fullness in the ear or feeling that the ear is plugged.  Fluid coming from the ear.  Ear pain.  Ear itch.  Ringing in the ear.  Coughing.  An obvious piece of earwax that can be seen inside the ear canal.  How is this diagnosed? This condition may be diagnosed based on:  Your symptoms.  Your medical history.  An ear exam. During the exam, your health care provider will look into your ear with an instrument called an otoscope.  You may have tests, including a hearing test. How is this treated? This condition may be treated by:  Using ear drops to soften the earwax.  Having the earwax removed by a health care provider. The health care provider may: ? Flush the ear with water. ? Use an instrument that has a loop  on the end (curette). ? Use a suction device.  Surgery to remove the wax buildup. This may be done in severe cases.  Follow these instructions at home:  Take over-the-counter and prescription medicines only as told by your health care provider.  Do not put any objects, including cotton swabs, into your ear. You can clean the opening of your ear canal with a washcloth or facial tissue.  Follow instructions from your health care provider about cleaning your ears. Do not over-clean your ears.  Drink enough fluid to keep your urine clear or pale yellow. This will help to thin the earwax.  Keep all follow-up visits as told by your health care provider. If earwax builds up in your ears often or if you use hearing aids, consider seeing your health care provider for routine, preventive ear cleanings. Ask your health care provider how often you should schedule your cleanings.  If you have hearing aids, clean them according to instructions from the manufacturer and your health care provider. Contact a health care provider if:  You have ear pain.  You develop a  fever.  You have blood, pus, or other fluid coming from your ear.  You have hearing loss.  You have ringing in your ears that does not go away.  Your symptoms do not improve with treatment.  You feel like the room is spinning (vertigo). Summary  Earwax can build up in the ear and cause discomfort or hearing loss.  The most common symptoms of this condition include reduced or muffled hearing and a feeling of fullness in the ear or feeling that the ear is plugged.  This condition may be diagnosed based on your symptoms, your medical history, and an ear exam.  This condition may be treated by using ear drops to soften the earwax or by having the earwax removed by a health care provider.  Do not put any objects, including cotton swabs, into your ear. You can clean the opening of your ear canal with a washcloth or facial  tissue. This information is not intended to replace advice given to you by your health care provider. Make sure you discuss any questions you have with your health care provider. Document Released: 05/27/2004 Document Revised: 06/30/2016 Document Reviewed: 06/30/2016 Elsevier Interactive Patient Education  2018 [place].  Strep Throat Strep throat is a bacterial infection of the throat. Your health care provider may call the infection tonsillitis or pharyngitis, depending on whether there is swelling in the tonsils or at the back of the throat. Strep throat is most common during the cold months of the year in children who are 42-71 years of age, but it can happen during any season in people of any age. This infection is spread from person to person (contagious) through coughing, sneezing, or close contact. What are the causes? Strep throat is caused by the bacteria called Streptococcus pyogenes. What increases the risk? This condition is more likely to develop in:  People who spend time in crowded places where the infection can spread easily.  People who have close contact with someone who has strep throat.  What are the signs or symptoms? Symptoms of this condition include:  Fever or chills.  Redness, swelling, or pain in the tonsils or throat.  Pain or difficulty when swallowing.  White or yellow spots on the tonsils or throat.  Swollen, tender glands in the neck or under the jaw.  Red rash all over the body (rare).  How is this diagnosed? This condition is diagnosed by performing a rapid strep test or by taking a swab of your throat (throat culture test). Results from a rapid strep test are usually ready in a few minutes, but throat culture test results are available after one or two days. How is this treated? This condition is treated with antibiotic medicine. Follow these instructions at home: Medicines  Take over-the-counter and prescription medicines only as told by  your health care provider.  Take your antibiotic as told by your health care provider. Do not stop taking the antibiotic even if you start to feel better.  Have family members who also have a sore throat or fever tested for strep throat. They may need antibiotics if they have the strep infection. Eating and drinking  Do not share food, drinking cups, or personal items that could cause the infection to spread to other people.  If swallowing is difficult, try eating soft foods until your sore throat feels better.  Drink enough fluid to keep your urine clear or pale yellow. General instructions  Gargle with a salt-water mixture 3-4 times per day  or as needed. To make a salt-water mixture, completely dissolve -1 tsp of salt in 1 cup of warm water.  Make sure that all household members wash their hands well.  Get plenty of rest.  Stay home from school or work until you have been taking antibiotics for 24 hours.  Keep all follow-up visits as told by your health care provider. This is important. Contact a health care provider if:  The glands in your neck continue to get bigger.  You develop a rash, cough, or earache.  You cough up a thick liquid that is green, yellow-brown, or bloody.  You have pain or discomfort that does not get better with medicine.  Your problems seem to be getting worse rather than better.  You have a fever. Get help right away if:  You have new symptoms, such as vomiting, severe headache, stiff or painful neck, chest pain, or shortness of breath.  You have severe throat pain, drooling, or changes in your voice.  You have swelling of the neck, or the skin on the neck becomes red and tender.  You have signs of dehydration, such as fatigue, dry mouth, and decreased urination.  You become increasingly sleepy, or you cannot wake up completely.  Your joints become red or painful. This information is not intended to replace advice given to you by your health  care provider. Make sure you discuss any questions you have with your health care provider. Document Released: 04/16/2000 Document Revised: 12/17/2015 Document Reviewed: 08/12/2014 Elsevier Interactive Patient Education  [employer].

## 2017-07-18 ENCOUNTER — Encounter: Payer: Self-pay | Admitting: Internal Medicine

## 2017-07-18 DIAGNOSIS — H10013 Acute follicular conjunctivitis, bilateral: Secondary | ICD-10-CM | POA: Diagnosis not present

## 2017-07-19 NOTE — Telephone Encounter (Signed)
Pt. Called and scheduled appt. For next week the 27th with Dr. Derrel Nip

## 2017-07-21 NOTE — Telephone Encounter (Signed)
Pt has been scheduled for 07/27/2017 @ 4:30pm. Pt is aware of appt date and time.

## 2017-07-27 ENCOUNTER — Ambulatory Visit (INDEPENDENT_AMBULATORY_CARE_PROVIDER_SITE_OTHER): Payer: Medicare Other | Admitting: Internal Medicine

## 2017-07-27 ENCOUNTER — Encounter: Payer: Self-pay | Admitting: Internal Medicine

## 2017-07-27 VITALS — BP 134/78 | HR 84 | Temp 98.0°F | Resp 14 | Ht 66.75 in | Wt 166.2 lb

## 2017-07-27 DIAGNOSIS — J301 Allergic rhinitis due to pollen: Secondary | ICD-10-CM

## 2017-07-27 DIAGNOSIS — Z79899 Other long term (current) drug therapy: Secondary | ICD-10-CM | POA: Diagnosis not present

## 2017-07-27 NOTE — Progress Notes (Signed)
Subjective:  Patient ID: Lori Caldwell, female    DOB: May 17, 1940  Age: 77 y.o. MRN: [id number]  CC: The primary encounter diagnosis was Long-term use of high-risk medication. A diagnosis of Seasonal allergic rhinitis due to pollen was also pertinent to this visit.  HPI Lori Caldwell presents for follow up on strep throat , and allergic rhinitis  Patient was treated recently for strep throat and during her evaluation she was told she could not take benadryl and zyrtec anymore because of her age,  Despite her tolerance and preference for both medications for management of allergic rhinitis and PND for many years.  She Since then she has been using the newly prescribed azelastine  And her symptoms of  allergic conjunctivitis have returned.  She went to her eye doctor for persistent symptoms and he advised her to resume the medications that were working for her previously  .   Outpatient Medications Prior to Visit  Medication Sig Dispense Refill  . aspirin 81 MG tablet Take 81 mg by mouth daily.      . hydrocortisone 2.5 % cream Apply topically 2 (two) times daily. 30 g 2  . hydrocortisone-pramoxine (ANALPRAM-HC) 2.5-1 % rectal cream Apply topicalyl twice daily as needed 30 g 0  . Multiple Vitamin (MULTIVITAMIN) tablet Take 1 tablet by mouth daily.      . Pramoxine-HC (HYDROCORTISONE ACE-PRAMOXINE) 2.5-1 % CREA Apply topically 2 (two) times daily. 30 g 5  . simvastatin (ZOCOR) 40 MG tablet Take 1 tablet (40 mg total) by mouth every evening. 30 tablet 2  . Azelastine HCl 0.15 % SOLN Place 2 sprays into the nose daily as needed. (Patient not taking: Reported on 07/27/2017) 30 mL 2  . azithromycin (ZITHROMAX) 250 MG tablet 2 pills day 1, 1 pill day 2-5 (Patient not taking: Reported on 07/27/2017) 6 tablet 0  . nystatin (MYCOSTATIN) 100000 UNIT/ML suspension Take 5 mLs (500,000 Units total) 4 (four) times daily by mouth. (Patient not taking: Reported on 07/27/2017) 60 mL 0   No  facility-administered medications prior to visit.     Review of Systems;  Patient denies headache, fevers, malaise, unintentional weight loss, skin rash, eye pain, sinus congestion and sinus pain, sore throat, dysphagia,  hemoptysis , cough, dyspnea, wheezing, chest pain, palpitations, orthopnea, edema, abdominal pain, nausea, melena, diarrhea, constipation, flank pain, dysuria, hematuria, urinary  Frequency, nocturia, numbness, tingling, seizures,  Focal weakness, Loss of consciousness,  Tremor, insomnia, depression, anxiety, and suicidal ideation.      Objective:  BP 134/78 (BP Location: Left Arm, Patient Position: Sitting, Cuff Size: Normal)   Pulse 84   Temp 98 F (36.7 C) (Oral)   Resp 14   Ht 5' 6.75" (1.695 m)   Wt 166 lb 3.2 oz (75.4 kg)   SpO2 99%   BMI 26.23 kg/m   BP Readings from Last 3 Encounters:  07/27/17 134/78  07/13/17 (!) 154/70  05/31/17 122/62    Wt Readings from Last 3 Encounters:  07/27/17 166 lb 3.2 oz (75.4 kg)  07/13/17 168 lb 3.2 oz (76.3 kg)  05/31/17 167 lb 6.4 oz (75.9 kg)    General appearance: alert, cooperative and appears stated age Ears: normal TM's and external ear canals both ears Throat: lips, mucosa, and tongue normal; teeth and gums normal Neck: no adenopathy, no carotid bruit, supple, symmetrical, trachea midline and thyroid not enlarged, symmetric, no tenderness/mass/nodules Back: symmetric, no curvature. ROM normal. No CVA tenderness. Lungs: clear to auscultation bilaterally Heart: regular  rate and rhythm, S1, S2 normal, no murmur, click, rub or gallop Abdomen: soft, non-tender; bowel sounds normal; no masses,  no organomegaly Pulses: 2+ and symmetric Skin: Skin color, texture, turgor normal. No rashes or lesions Lymph nodes: Cervical, supraclavicular, and axillary nodes normal.  No results found for: HGBA1C  Lab Results  Component Value Date   CREATININE 0.75 03/14/2017   CREATININE 0.84 03/23/2016   CREATININE 0.75  07/30/2015    Lab Results  Component Value Date   WBC 5.8 03/23/2016   HGB 14.2 03/23/2016   HCT 41.1 03/23/2016   PLT 255.0 03/23/2016   GLUCOSE 83 03/14/2017   CHOL 198 03/14/2017   TRIG 116.0 03/14/2017   HDL 80.70 03/14/2017   LDLDIRECT 117.8 06/25/2013   LDLCALC 94 03/14/2017   ALT 24 03/14/2017   AST 27 03/14/2017   NA 138 03/14/2017   K 4.3 03/14/2017   CL 103 03/14/2017   CREATININE 0.75 03/14/2017   BUN 16 03/14/2017   CO2 25 03/14/2017   TSH 3.81 03/14/2017    Mm Screening Breast Tomo Uni L  Result Date: 05/13/2017 CLINICAL DATA:  Screening. EXAM: DIGITAL SCREENING UNILATERAL LEFT MAMMOGRAM WITH CAD AND ADJUNCT TOMO COMPARISON:  Previous exam(s). ACR Breast Density Category b: There are scattered areas of fibroglandular density. FINDINGS: The patient has had a right mastectomy. There are no findings suspicious for malignancy. Images were processed with CAD. IMPRESSION: No mammographic evidence of malignancy. A result letter of this screening mammogram will be mailed directly to the patient. RECOMMENDATION: Screening mammogram in one year.  (Code:SM-L-63M) BI-RADS CATEGORY  1: Negative. Electronically Signed   By: Nolon Nations M.D.   On: 05/13/2017 08:35    Assessment & Plan:   Problem List Items Addressed This Visit    Allergic rhinitis    Discussed the rationale behind Dr Claris Gladden recommendations to stop taking the first and second generation antihistamines,  But given patient's tolerance of these medications and the failure of the newer medications to control her symptoms, I gave her permission to resume zyrtec and benadryl       Other Visit Diagnoses    Long-term use of high-risk medication    -  Primary   Relevant Orders   Comprehensive metabolic panel    A total of 25 minutes of face to face time was spent with patient more than half of which was spent in counselling about the above mentioned conditions  and coordination of care   I have discontinued  Lori Capuchin "[name]"'s nystatin and azithromycin. I am also having her maintain her aspirin, multivitamin, hydrocortisone-pramoxine, hydrocortisone, Hydrocortisone Ace-Pramoxine, simvastatin, and Azelastine HCl.  No orders of the defined types were placed in this encounter.   Medications Discontinued During This Encounter  Medication Reason  . azithromycin (ZITHROMAX) 250 MG tablet Completed Course  . nystatin (MYCOSTATIN) 100000 UNIT/ML suspension Completed Course    Follow-up: No follow-ups on file.   Crecencio Mc, MD

## 2017-07-28 DIAGNOSIS — J309 Allergic rhinitis, unspecified: Secondary | ICD-10-CM | POA: Insufficient documentation

## 2017-07-28 NOTE — Assessment & Plan Note (Signed)
Discussed the rationale behind Dr Claris Gladden recommendations to stop taking the first and second generation antihistamines,  But given patient's tolerance of these medications and the failure of the newer medications to control her symptoms, I gave her permission to resume zyrtec and benadryl

## 2017-08-09 DIAGNOSIS — H2513 Age-related nuclear cataract, bilateral: Secondary | ICD-10-CM | POA: Diagnosis not present

## 2017-08-09 DIAGNOSIS — H02834 Dermatochalasis of left upper eyelid: Secondary | ICD-10-CM | POA: Diagnosis not present

## 2017-08-12 ENCOUNTER — Other Ambulatory Visit: Payer: Self-pay | Admitting: Internal Medicine

## 2017-08-23 ENCOUNTER — Ambulatory Visit: Payer: BC Managed Care – PPO | Admitting: Internal Medicine

## 2017-08-30 ENCOUNTER — Other Ambulatory Visit: Payer: Self-pay

## 2017-08-30 MED ORDER — SIMVASTATIN 40 MG PO TABS: ORAL_TABLET | ORAL | 1 refills | Status: DC

## 2017-09-16 ENCOUNTER — Ambulatory Visit: Payer: BC Managed Care – PPO | Admitting: Internal Medicine

## 2017-10-03 ENCOUNTER — Encounter: Payer: Self-pay | Admitting: Internal Medicine

## 2017-10-04 NOTE — Telephone Encounter (Signed)
Pt's last lipid panel, TSH and CMP were done on 03/14/2017. Pt would like to know when she is due for her next lab work?

## 2017-11-02 ENCOUNTER — Other Ambulatory Visit (INDEPENDENT_AMBULATORY_CARE_PROVIDER_SITE_OTHER): Payer: Medicare Other

## 2017-11-02 DIAGNOSIS — Z79899 Other long term (current) drug therapy: Secondary | ICD-10-CM

## 2017-11-02 LAB — COMPREHENSIVE METABOLIC PANEL
ALT: 21 U/L (ref 0–35)
AST: 27 U/L (ref 0–37)
Albumin: 4.2 g/dL (ref 3.5–5.2)
Alkaline Phosphatase: 46 U/L (ref 39–117)
BUN: 16 mg/dL (ref 6–23)
CALCIUM: 9.6 mg/dL (ref 8.4–10.5)
CO2: 25 meq/L (ref 19–32)
Chloride: 106 mEq/L (ref 96–112)
Creatinine, Ser: 0.78 mg/dL (ref 0.40–1.20)
GFR: 76.31 mL/min (ref >60.00)
GLUCOSE: 87 mg/dL (ref 70–99)
POTASSIUM: 4.5 meq/L (ref 3.5–5.1)
Sodium: 140 mEq/L (ref 135–145)
Total Bilirubin: 0.6 mg/dL (ref 0.2–1.2)
Total Protein: 6.9 g/dL (ref 6.0–8.3)

## 2017-12-12 DIAGNOSIS — H903 Sensorineural hearing loss, bilateral: Secondary | ICD-10-CM | POA: Diagnosis not present

## 2017-12-12 DIAGNOSIS — H6123 Impacted cerumen, bilateral: Secondary | ICD-10-CM | POA: Diagnosis not present

## 2018-03-02 DIAGNOSIS — M2242 Chondromalacia patellae, left knee: Secondary | ICD-10-CM | POA: Diagnosis not present

## 2018-03-02 NOTE — Telephone Encounter (Signed)
LMTCB. Need to schedule pt for a same day appt sometime next week. Please transfer pt to our office.

## 2018-03-02 NOTE — Telephone Encounter (Signed)
Please schedule Lori Caldwell in  [address]

## 2018-03-29 ENCOUNTER — Other Ambulatory Visit: Payer: Self-pay | Admitting: Internal Medicine

## 2018-03-29 DIAGNOSIS — Z1231 Encounter for screening mammogram for malignant neoplasm of breast: Secondary | ICD-10-CM

## 2018-04-10 ENCOUNTER — Ambulatory Visit (INDEPENDENT_AMBULATORY_CARE_PROVIDER_SITE_OTHER): Payer: Medicare Other | Admitting: Internal Medicine

## 2018-04-10 ENCOUNTER — Encounter: Payer: Self-pay | Admitting: Internal Medicine

## 2018-04-10 VITALS — BP 128/84 | HR 75 | Temp 97.4°F | Resp 14 | Ht 66.75 in | Wt 166.4 lb

## 2018-04-10 DIAGNOSIS — E782 Mixed hyperlipidemia: Secondary | ICD-10-CM

## 2018-04-10 DIAGNOSIS — R5383 Other fatigue: Secondary | ICD-10-CM | POA: Diagnosis not present

## 2018-04-10 DIAGNOSIS — Z1239 Encounter for other screening for malignant neoplasm of breast: Secondary | ICD-10-CM

## 2018-04-10 DIAGNOSIS — Z1211 Encounter for screening for malignant neoplasm of colon: Secondary | ICD-10-CM | POA: Diagnosis not present

## 2018-04-10 DIAGNOSIS — M8580 Other specified disorders of bone density and structure, unspecified site: Secondary | ICD-10-CM | POA: Diagnosis not present

## 2018-04-10 DIAGNOSIS — Z1231 Encounter for screening mammogram for malignant neoplasm of breast: Secondary | ICD-10-CM | POA: Diagnosis not present

## 2018-04-10 DIAGNOSIS — Z853 Personal history of malignant neoplasm of breast: Secondary | ICD-10-CM

## 2018-04-10 LAB — TSH: TSH: 2.98 u[IU]/mL (ref 0.35–4.50)

## 2018-04-10 LAB — CBC WITH DIFFERENTIAL/PLATELET
Basophils Absolute: 0 10*3/uL (ref 0.0–0.1)
Basophils Relative: 0.5 % (ref 0.0–3.0)
Eosinophils Absolute: 0 10*3/uL (ref 0.0–0.7)
Eosinophils Relative: 0.6 % (ref 0.0–5.0)
HEMATOCRIT: 42.7 % (ref 36.0–46.0)
Hemoglobin: 14.5 g/dL (ref 12.0–15.0)
Lymphocytes Relative: 31.7 % (ref 12.0–46.0)
Lymphs Abs: 2 10*3/uL (ref 0.7–4.0)
MCHC: 34.1 g/dL (ref 30.0–36.0)
MCV: 97.9 fl (ref 78.0–100.0)
Monocytes Absolute: 0.5 10*3/uL (ref 0.1–1.0)
Monocytes Relative: 7.4 % (ref 3.0–12.0)
NEUTROS PCT: 59.8 % (ref 43.0–77.0)
Neutro Abs: 3.7 10*3/uL (ref 1.4–7.7)
PLATELETS: 232 10*3/uL (ref 150.0–400.0)
RBC: 4.36 Mil/uL (ref 3.87–5.11)
RDW: 12.9 % (ref 11.5–15.5)
WBC: 6.2 10*3/uL (ref 4.0–10.5)

## 2018-04-10 LAB — COMPREHENSIVE METABOLIC PANEL
ALBUMIN: 4.6 g/dL (ref 3.5–5.2)
ALK PHOS: 55 U/L (ref 39–117)
ALT: 21 U/L (ref 0–35)
AST: 26 U/L (ref 0–37)
BILIRUBIN TOTAL: 0.6 mg/dL (ref 0.2–1.2)
BUN: 19 mg/dL (ref 6–23)
CO2: 26 mEq/L (ref 19–32)
CREATININE: 0.77 mg/dL (ref 0.40–1.20)
Calcium: 9.9 mg/dL (ref 8.4–10.5)
Chloride: 103 mEq/L (ref 96–112)
GFR: 77.37 mL/min (ref >60.00)
Glucose, Bld: 90 mg/dL (ref 70–99)
Potassium: 3.9 mEq/L (ref 3.5–5.1)
SODIUM: 138 meq/L (ref 135–145)
TOTAL PROTEIN: 7.7 g/dL (ref 6.0–8.3)

## 2018-04-10 LAB — LIPID PANEL
CHOLESTEROL: 198 mg/dL (ref 0–200)
HDL: 77.5 mg/dL (ref >39.00)
LDL Cholesterol: 100 mg/dL — ABNORMAL HIGH (ref 0–99)
NonHDL: 120.45
Total CHOL/HDL Ratio: 3
Triglycerides: 101 mg/dL (ref 0.0–149.0)
VLDL: 20.2 mg/dL (ref 0.0–40.0)

## 2018-04-10 NOTE — Progress Notes (Signed)
Patient ID: Lori Caldwell, female    DOB: 06/15/1940  Age: 77 y.o. MRN: [id number]  The patient is here for  Follow up and management of other chronic and acute problems.    cologuard neg 2019 Right mastectomy in 2000,  left mammogram normal jan 2019 DEXA 2016,  Osteopenia      The risk factors are reflected in the social history.  The roster of all physicians providing medical care to patient - is listed in the Snapshot section of the chart.  Activities of daily living:  The patient is 100% independent in all ADLs: dressing, toileting, feeding as well as independent mobility  Home safety : The patient has smoke detectors in the home. They wear seatbelts.  There are no firearms at home. There is no violence in the home.   There is no risks for hepatitis, STDs or HIV. There is no   history of blood transfusion. They have no travel history to infectious disease endemic areas of the world.  The patient has seen their dentist in the last six month. They have seen their eye doctor in the last year. She wears bilateral hearing aids.  They do not  have excessive sun exposure. Discussed the need for sun protection: hats, long sleeves and use of sunscreen if there is significant sun exposure.   Diet: the importance of a healthy diet is discussed. They do have a healthy diet.  The benefits of regular aerobic exercise were discussed. She walks 4 times per week ,  20 minutes.   Depression screen: there are no signs or vegative symptoms of depression- irritability, change in appetite, anhedonia, sadness/tearfullness.  Cognitive assessment: the patient manages all their financial and personal affairs and is actively engaged. They could relate day,date,year and events; recalled 2/3 objects at 3 minutes; performed clock-face test normally.  The following portions of the patient's history were reviewed and updated as appropriate: allergies, current medications, past family history, past medical  history,  past surgical history, past social history  and problem list.  Visual acuity was not assessed per patient preference since she has regular follow up with her ophthalmologist. Hearing and body mass index were assessed and reviewed.   During the course of the visit the patient was educated and counseled about appropriate screening and preventive services including : fall prevention , diabetes screening, nutrition counseling, colorectal cancer screening, and recommended immunizations.    CC: The primary encounter diagnosis was Mixed hyperlipidemia. Diagnoses of Colon cancer screening, Fatigue, unspecified type, Breast cancer screening, Encounter for screening mammogram for malignant neoplasm of breast, Personal history of breast cancer, and Osteopenia due to cancer therapy were also pertinent to this visit.  Saw Lori Caldwell for left knee pain prior to flight to [facility] in October .  Sx rays,  Arthritis  Pain level was 2   Taking vitamin D gummy bear   History Lori Caldwell has a past medical history of Breast cancer ([place]) (2000), GERD (gastroesophageal reflux disease), Hyperlipidemia, Personal history of chemotherapy (2000), and Personal history of radiation therapy (2000).   She has a past surgical history that includes Breast surgery and Mastectomy (Right, 2000).   Her family history includes Cancer in her maternal grandmother; Heart disease in her father; Hypertension in her mother.She reports that she has never smoked. She has never used smokeless tobacco. She reports that she drinks alcohol. She reports that she does not use drugs.  Outpatient Medications Prior to Visit  Medication Sig Dispense Refill  . aspirin  81 MG tablet Take 81 mg by mouth daily.      . Azelastine HCl 0.15 % SOLN Place 2 sprays into the nose daily as needed. 30 mL 2  . hydrocortisone 2.5 % cream Apply topically 2 (two) times daily. 30 g 2  . Multiple Vitamin (MULTIVITAMIN) tablet Take 1 tablet by mouth daily.       . simvastatin (ZOCOR) 40 MG tablet Take one tablet by mouth every evening 90 tablet 1  . hydrocortisone-pramoxine (ANALPRAM-HC) 2.5-1 % rectal cream Apply topicalyl twice daily as needed (Patient not taking: Reported on 04/10/2018) 30 g 0  . Pramoxine-HC (HYDROCORTISONE ACE-PRAMOXINE) 2.5-1 % CREA Apply topically 2 (two) times daily. (Patient not taking: Reported on 04/10/2018) 30 g 5   No facility-administered medications prior to visit.     Review of Systems   Patient denies headache, fevers, malaise, unintentional weight loss, skin rash, eye pain, sinus congestion and sinus pain, sore throat, dysphagia,  hemoptysis , cough, dyspnea, wheezing, chest pain, palpitations, orthopnea, edema, abdominal pain, nausea, melena, diarrhea, constipation, flank pain, dysuria, hematuria, urinary  Frequency, nocturia, numbness, tingling, seizures,  Focal weakness, Loss of consciousness,  Tremor, insomnia, depression, anxiety, and suicidal ideation.      Objective:  BP 128/84 (BP Location: Left Arm, Patient Position: Sitting, Cuff Size: Normal)   Pulse 75   Temp (!) 97.4 F (36.3 C) (Oral)   Resp 14   Ht 5' 6.75" (1.695 m)   Wt 166 lb 6.4 oz (75.5 kg)   SpO2 98%   BMI 26.26 kg/m   Physical Exam  General appearance: alert, cooperative and appears stated age Head: Normocephalic, without obvious abnormality, atraumatic Eyes: conjunctivae/corneas clear. PERRL, EOM's intact. Fundi benign. Ears: normal TM's and external ear canals both ears Nose: Nares normal. Septum midline. Mucosa normal. No drainage or sinus tenderness. Throat: lips, mucosa, and tongue normal; teeth and gums normal Neck: no adenopathy, no carotid bruit, no JVD, supple, symmetrical, trachea midline and thyroid not enlarged, symmetric, no tenderness/mass/nodules Lungs: clear to auscultation bilaterally Breasts: right mastectomy s/p reconstruction left breast normal appearance, no masses or tenderness Heart: regular rate and  rhythm, S1, S2 normal, no murmur, click, rub or gallop Abdomen: soft, non-tender; bowel sounds normal; no masses,  no organomegaly Extremities: extremities normal, atraumatic, no cyanosis or edema Pulses: 2+ and symmetric Skin: Skin color, texture, turgor normal. No rashes or lesions Neurologic: Alert and oriented X 3, normal strength and tone. Normal symmetric reflexes. Normal coordination and gait.    Assessment & Plan:   Problem List Items Addressed This Visit    Hyperlipidemia - Primary    Managed with zocor.  LDL and triglycerides are at goal on current medications. She has no side effects and liver enzymes are normal. No changes today  Lab Results  Component Value Date   CHOL 198 04/10/2018   HDL 77.50 04/10/2018   LDLCALC 100 (H) 04/10/2018   LDLDIRECT 117.8 06/25/2013   TRIG 101.0 04/10/2018   CHOLHDL 3 04/10/2018   Lab Results  Component Value Date   ALT 21 04/10/2018   AST 26 04/10/2018   ALKPHOS 55 04/10/2018   BILITOT 0.6 04/10/2018         Relevant Orders   Lipid panel (Completed)   Comprehensive metabolic panel (Completed)   Comprehensive metabolic panel   Osteopenia due to cancer therapy    Spine t score -2.0  by last DEXA in 2016,  She is due for repeat DEXA scan in 2020.  Personal history of breast cancer    Screening mammogram for left breast ordered annually.       Routine adult health maintenance    Negative Cologuard result 2019       Other Visit Diagnoses    Fatigue, unspecified type       Relevant Orders   TSH (Completed)   CBC with Differential/Platelet (Completed)   Breast cancer screening       Encounter for screening mammogram for malignant neoplasm of breast       Relevant Orders   MM Digital Screening Unilat L     A total of 40 minutes was spent with patient more than half of which was spent in counseling patient on the above mentioned issues , reviewing and explaining recent labs and imaging studies done, and  coordination of care. I have discontinued Lori Caldwell "Lori Caldwell"'s hydrocortisone-pramoxine and Hydrocortisone Ace-Pramoxine. I am also having her maintain her aspirin, multivitamin, hydrocortisone, Azelastine HCl, and simvastatin.  No orders of the defined types were placed in this encounter.   Medications Discontinued During This Encounter  Medication Reason  . hydrocortisone-pramoxine (ANALPRAM-HC) 2.5-1 % rectal cream   . Pramoxine-HC (HYDROCORTISONE ACE-PRAMOXINE) 2.5-1 % CREA Error    Follow-up: No follow-ups on file.   Crecencio Mc, MD

## 2018-04-10 NOTE — Assessment & Plan Note (Signed)
Negative Cologuard result 2019

## 2018-04-10 NOTE — Patient Instructions (Addendum)
You are doing well!  And you NEVER AGE!!!!  Thank you for the Christmas jelly!     I recommend that you have your liver enzymes checked every 6 months  While you are taking simvastatin.  They;ll be due again in June.    We'll repeat your DEXA scan in 2021   For your knee:  1 Mobic = 3 motrin every 8 hours  =   2 aleve every 12 hours ; (pick one of these)  You can add up to 2000 mg of acetominophen (tylenol) every day safely  In divided doses (500 mg every 6 hours  Or 1000 mg every 12 hours.)   Health Maintenance for Postmenopausal Women Menopause is a normal process in which your reproductive ability comes to an end. This process happens gradually over a span of months to years, usually between the ages of 23 and 64. Menopause is complete when you have missed 12 consecutive menstrual periods. It is important to talk with your health care provider about some of the most common conditions that affect postmenopausal women, such as heart disease, cancer, and bone loss (osteoporosis). Adopting a healthy lifestyle and getting preventive care can help to promote your health and wellness. Those actions can also lower your chances of developing some of these common conditions. What should I know about menopause? During menopause, you may experience a number of symptoms, such as:  Moderate-to-severe hot flashes.  Night sweats.  Decrease in sex drive.  Mood swings.  Headaches.  Tiredness.  Irritability.  Memory problems.  Insomnia.  Choosing to treat or not to treat menopausal changes is an individual decision that you make with your health care provider. What should I know about hormone replacement therapy and supplements? Hormone therapy products are effective for treating symptoms that are associated with menopause, such as hot flashes and night sweats. Hormone replacement carries certain risks, especially as you become older. If you are thinking about using estrogen or estrogen  with progestin treatments, discuss the benefits and risks with your health care provider. What should I know about heart disease and stroke? Heart disease, heart attack, and stroke become more likely as you age. This may be due, in part, to the hormonal changes that your body experiences during menopause. These can affect how your body processes dietary fats, triglycerides, and cholesterol. Heart attack and stroke are both medical emergencies. There are many things that you can do to help prevent heart disease and stroke:  Have your blood pressure checked at least every 1-2 years. High blood pressure causes heart disease and increases the risk of stroke.  If you are 39-50 years old, ask your health care provider if you should take aspirin to prevent a heart attack or a stroke.  Do not use any tobacco products, including cigarettes, chewing tobacco, or electronic cigarettes. If you need help quitting, ask your health care provider.  It is important to eat a healthy diet and maintain a healthy weight. ? Be sure to include plenty of vegetables, fruits, low-fat dairy products, and lean protein. ? Avoid eating foods that are high in solid fats, added sugars, or salt (sodium).  Get regular exercise. This is one of the most important things that you can do for your health. ? Try to exercise for at least 150 minutes each week. The type of exercise that you do should increase your heart rate and make you sweat. This is known as moderate-intensity exercise. ? Try to do strengthening exercises at least  twice each week. Do these in addition to the moderate-intensity exercise.  Know your numbers.Ask your health care provider to check your cholesterol and your blood glucose. Continue to have your blood tested as directed by your health care provider.  What should I know about cancer screening? There are several types of cancer. Take the following steps to reduce your risk and to catch any cancer development  as early as possible. Breast Cancer  Practice breast self-awareness. ? This means understanding how your breasts normally appear and feel. ? It also means doing regular breast self-exams. Let your health care provider know about any changes, no matter how small.  If you are 41 or older, have a clinician do a breast exam (clinical breast exam or CBE) every year. Depending on your age, family history, and medical history, it may be recommended that you also have a yearly breast X-ray (mammogram).  If you have a family history of breast cancer, talk with your health care provider about genetic screening.  If you are at high risk for breast cancer, talk with your health care provider about having an MRI and a mammogram every year.  Breast cancer (BRCA) gene test is recommended for women who have family members with BRCA-related cancers. Results of the assessment will determine the need for genetic counseling and BRCA1 and for BRCA2 testing. BRCA-related cancers include these types: ? Breast. This occurs in males or females. ? Ovarian. ? Tubal. This may also be called fallopian tube cancer. ? Cancer of the abdominal or pelvic lining (peritoneal cancer). ? Prostate. ? Pancreatic.  Cervical, Uterine, and Ovarian Cancer Your health care provider may recommend that you be screened regularly for cancer of the pelvic organs. These include your ovaries, uterus, and vagina. This screening involves a pelvic exam, which includes checking for microscopic changes to the surface of your cervix (Pap test).  For women ages 21-65, health care providers may recommend a pelvic exam and a Pap test every three years. For women ages 75-65, they may recommend the Pap test and pelvic exam, combined with testing for human papilloma virus (HPV), every five years. Some types of HPV increase your risk of cervical cancer. Testing for HPV may also be done on women of any age who have unclear Pap test results.  Other health  care providers may not recommend any screening for nonpregnant women who are considered low risk for pelvic cancer and have no symptoms. Ask your health care provider if a screening pelvic exam is right for you.  If you have had past treatment for cervical cancer or a condition that could lead to cancer, you need Pap tests and screening for cancer for at least 20 years after your treatment. If Pap tests have been discontinued for you, your risk factors (such as having a new sexual partner) need to be reassessed to determine if you should start having screenings again. Some women have medical problems that increase the chance of getting cervical cancer. In these cases, your health care provider may recommend that you have screening and Pap tests more often.  If you have a family history of uterine cancer or ovarian cancer, talk with your health care provider about genetic screening.  If you have vaginal bleeding after reaching menopause, tell your health care provider.  There are currently no reliable tests available to screen for ovarian cancer.  Lung Cancer Lung cancer screening is recommended for adults 52-67 years old who are at high risk for lung cancer because  of a history of smoking. A yearly low-dose CT scan of the lungs is recommended if you:  Currently smoke.  Have a history of at least 30 pack-years of smoking and you currently smoke or have quit within the past 15 years. A pack-year is smoking an average of one pack of cigarettes per day for one year.  Yearly screening should:  Continue until it has been 15 years since you quit.  Stop if you develop a health problem that would prevent you from having lung cancer treatment.  Colorectal Cancer  This type of cancer can be detected and can often be prevented.  Routine colorectal cancer screening usually begins at age 52 and continues through age 30.  If you have risk factors for colon cancer, your health care provider may  recommend that you be screened at an earlier age.  If you have a family history of colorectal cancer, talk with your health care provider about genetic screening.  Your health care provider may also recommend using home test kits to check for hidden blood in your stool.  A small camera at the end of a tube can be used to examine your colon directly (sigmoidoscopy or colonoscopy). This is done to check for the earliest forms of colorectal cancer.  Direct examination of the colon should be repeated every 5-10 years until age 22. However, if early forms of precancerous polyps or small growths are found or if you have a family history or genetic risk for colorectal cancer, you may need to be screened more often.  Skin Cancer  Check your skin from head to toe regularly.  Monitor any moles. Be sure to tell your health care provider: ? About any new moles or changes in moles, especially if there is a change in a mole's shape or color. ? If you have a mole that is larger than the size of a pencil eraser.  If any of your family members has a history of skin cancer, especially at a young age, talk with your health care provider about genetic screening.  Always use sunscreen. Apply sunscreen liberally and repeatedly throughout the day.  Whenever you are outside, protect yourself by wearing long sleeves, pants, a wide-brimmed hat, and sunglasses.  What should I know about osteoporosis? Osteoporosis is a condition in which bone destruction happens more quickly than new bone creation. After menopause, you may be at an increased risk for osteoporosis. To help prevent osteoporosis or the bone fractures that can happen because of osteoporosis, the following is recommended:  If you are 64-47 years old, get at least 1,000 mg of calcium and at least 600 mg of vitamin D per day.  If you are older than age 42 but younger than age 75, get at least 1,200 mg of calcium and at least 600 mg of vitamin D per  day.  If you are older than age 59, get at least 1,200 mg of calcium and at least 800 mg of vitamin D per day.  Smoking and excessive alcohol intake increase the risk of osteoporosis. Eat foods that are rich in calcium and vitamin D, and do weight-bearing exercises several times each week as directed by your health care provider. What should I know about how menopause affects my mental health? Depression may occur at any age, but it is more common as you become older. Common symptoms of depression include:  Low or sad mood.  Changes in sleep patterns.  Changes in appetite or eating patterns.  Feeling  an overall lack of motivation or enjoyment of activities that you previously enjoyed.  Frequent crying spells.  Talk with your health care provider if you think that you are experiencing depression. What should I know about immunizations? It is important that you get and maintain your immunizations. These include:  Tetanus, diphtheria, and pertussis (Tdap) booster vaccine.  Influenza every year before the flu season begins.  Pneumonia vaccine.  Shingles vaccine.  Your health care provider may also recommend other immunizations. This information is not intended to replace advice given to you by your health care provider. Make sure you discuss any questions you have with your health care provider. Document Released: 06/11/2005 Document Revised: 11/07/2015 Document Reviewed: 01/21/2015 Elsevier Interactive Patient Education  2018 [employer].

## 2018-04-11 ENCOUNTER — Encounter: Payer: Self-pay | Admitting: Internal Medicine

## 2018-04-11 NOTE — Assessment & Plan Note (Addendum)
Spine t score -2.0  by last DEXA in 2016,  She is due for repeat DEXA scan in 2020.

## 2018-04-11 NOTE — Assessment & Plan Note (Signed)
Managed with zocor.  LDL and triglycerides are at goal on current medications. She has no side effects and liver enzymes are normal. No changes today  Lab Results  Component Value Date   CHOL 198 04/10/2018   HDL 77.50 04/10/2018   LDLCALC 100 (H) 04/10/2018   LDLDIRECT 117.8 06/25/2013   TRIG 101.0 04/10/2018   CHOLHDL 3 04/10/2018   Lab Results  Component Value Date   ALT 21 04/10/2018   AST 26 04/10/2018   ALKPHOS 55 04/10/2018   BILITOT 0.6 04/10/2018

## 2018-04-11 NOTE — Assessment & Plan Note (Signed)
Screening mammogram for left breast ordered annually.

## 2018-04-17 DIAGNOSIS — H6123 Impacted cerumen, bilateral: Secondary | ICD-10-CM | POA: Diagnosis not present

## 2018-04-17 DIAGNOSIS — R490 Dysphonia: Secondary | ICD-10-CM | POA: Diagnosis not present

## 2018-04-17 DIAGNOSIS — H903 Sensorineural hearing loss, bilateral: Secondary | ICD-10-CM | POA: Diagnosis not present

## 2018-05-18 ENCOUNTER — Ambulatory Visit
Admission: RE | Admit: 2018-05-18 | Discharge: 2018-05-18 | Disposition: A | Discharge status: Home / Self Care | Payer: Medicare Other | Source: Ambulatory Visit | Attending: Internal Medicine | Admitting: Internal Medicine

## 2018-05-18 DIAGNOSIS — Z1231 Encounter for screening mammogram for malignant neoplasm of breast: Secondary | ICD-10-CM | POA: Diagnosis not present

## 2018-05-22 ENCOUNTER — Encounter: Payer: Self-pay | Admitting: Internal Medicine

## 2018-06-01 ENCOUNTER — Ambulatory Visit: Payer: BC Managed Care – PPO

## 2018-06-16 ENCOUNTER — Other Ambulatory Visit: Payer: Self-pay | Admitting: Internal Medicine

## 2018-09-08 ENCOUNTER — Telehealth: Payer: Self-pay

## 2018-09-08 MED ORDER — HYDROCORTISONE 2.5 % EX CREA: TOPICAL_CREAM | Freq: Two times a day (BID) | CUTANEOUS | 0 refills | Status: DC

## 2018-09-08 NOTE — Telephone Encounter (Signed)
rx sent in for hydrocortisone cream 

## 2018-09-08 NOTE — Telephone Encounter (Signed)
Does she need this cream?  What does she need it for?

## 2018-09-08 NOTE — Telephone Encounter (Signed)
Pt is requesting a refill on hydrocortisone 2.5% sent to [facility].   Amey Hossain,cma

## 2018-09-08 NOTE — Telephone Encounter (Signed)
Patient says she uses this for poison ivy because she gets it very easily. Dr Derrel Nip has given her this for years and she uses it when she gets poison ivy to keep it from spreading. She says she has enough to last until maybe Monday. She does have poison ivy now, but it is getting better. Advised that Dr. Derrel Nip is not in the office. Confirmed nothing else acute going on. Advised patient that I would call her back if anything further was needed.

## 2018-09-09 NOTE — Telephone Encounter (Signed)
Tried to call pt and notify her that I sent in her cream.  Unable to reach.  Please notify pt, rx for cream sent in to pharmacy.

## 2018-09-11 NOTE — Telephone Encounter (Signed)
Left detailed message for pt 

## 2018-09-15 NOTE — Telephone Encounter (Signed)
error 

## 2018-09-21 DIAGNOSIS — H02834 Dermatochalasis of left upper eyelid: Secondary | ICD-10-CM | POA: Diagnosis not present

## 2018-09-21 DIAGNOSIS — H25813 Combined forms of age-related cataract, bilateral: Secondary | ICD-10-CM | POA: Diagnosis not present

## 2018-10-19 DIAGNOSIS — H6123 Impacted cerumen, bilateral: Secondary | ICD-10-CM | POA: Diagnosis not present

## 2018-10-19 DIAGNOSIS — H903 Sensorineural hearing loss, bilateral: Secondary | ICD-10-CM | POA: Diagnosis not present

## 2018-12-04 ENCOUNTER — Other Ambulatory Visit: Payer: Self-pay

## 2018-12-13 ENCOUNTER — Other Ambulatory Visit: Payer: Self-pay

## 2018-12-13 ENCOUNTER — Encounter: Payer: Self-pay | Admitting: Emergency Medicine

## 2018-12-13 ENCOUNTER — Ambulatory Visit
Admission: EM | Admit: 2018-12-13 | Discharge: 2018-12-13 | Disposition: A | Discharge status: Home / Self Care | Payer: Medicare Other | Attending: Emergency Medicine | Admitting: Emergency Medicine

## 2018-12-13 DIAGNOSIS — Z9011 Acquired absence of right breast and nipple: Secondary | ICD-10-CM | POA: Diagnosis not present

## 2018-12-13 DIAGNOSIS — Z7189 Other specified counseling: Secondary | ICD-10-CM | POA: Diagnosis not present

## 2018-12-13 DIAGNOSIS — Z79899 Other long term (current) drug therapy: Secondary | ICD-10-CM | POA: Insufficient documentation

## 2018-12-13 DIAGNOSIS — Z7982 Long term (current) use of aspirin: Secondary | ICD-10-CM | POA: Diagnosis not present

## 2018-12-13 DIAGNOSIS — Z923 Personal history of irradiation: Secondary | ICD-10-CM | POA: Diagnosis not present

## 2018-12-13 DIAGNOSIS — Z9221 Personal history of antineoplastic chemotherapy: Secondary | ICD-10-CM | POA: Insufficient documentation

## 2018-12-13 DIAGNOSIS — Z853 Personal history of malignant neoplasm of breast: Secondary | ICD-10-CM | POA: Diagnosis not present

## 2018-12-13 DIAGNOSIS — Z20828 Contact with and (suspected) exposure to other viral communicable diseases: Secondary | ICD-10-CM | POA: Diagnosis not present

## 2018-12-13 DIAGNOSIS — K219 Gastro-esophageal reflux disease without esophagitis: Secondary | ICD-10-CM | POA: Diagnosis not present

## 2018-12-13 DIAGNOSIS — Z9012 Acquired absence of left breast and nipple: Secondary | ICD-10-CM | POA: Diagnosis not present

## 2018-12-13 DIAGNOSIS — E785 Hyperlipidemia, unspecified: Secondary | ICD-10-CM | POA: Insufficient documentation

## 2018-12-13 NOTE — ED Triage Notes (Signed)
Pt was on family vacation this past week and one of her grandchildren came back and went back to school and had has pt thinks is respiratory panel done and was positive for some type of virus but did not specify. Pt is asymptomatic.

## 2018-12-13 NOTE — Discharge Instructions (Addendum)
Monitor.  Refer to [facility] information, remain home until testing result received.  Follow up with your primary care physician this week as needed. Return to Urgent care for new or worsening concerns.

## 2018-12-13 NOTE — ED Provider Notes (Signed)
[facility] ____________________________________________  Time seen: Approximately 10:49 AM  I have reviewed the triage vital signs and the nursing notes.   HISTORY  Chief Complaint COVID testing   HPI Lori Caldwell is a 78 y.o. female presenting for COVID-19 testing.  Patient and her husband report that they just recently returned from out of town with their family.  States that her granddaughter return to school, and this past weekend began having cold symptoms.  Reports that she had a respiratory panel completed that showed positive for a viral illness, unsure which.  States due to this they would like to have COVID-19 testing.  Patient denies any cough, chest pain, shortness of breath, rash, vomiting, diarrhea, changes in taste or smell.  States that she feels well.  Crecencio Mc, MD: PCP   Past Medical History:  Diagnosis Date  . Breast cancer ([place]) 2000   RT MASTECTOMY  . GERD (gastroesophageal reflux disease)   . Hyperlipidemia   . Personal history of chemotherapy 2000   BREAST CA  . Personal history of radiation therapy 2000   BREAST CA    Patient Active Problem List   Diagnosis Date Noted  . Allergic rhinitis 07/28/2017  . Routine adult health maintenance 03/14/2017  . Forearm mass, right 11/01/2016  . Patellar bursitis of left knee 11/01/2016  . Medicare annual wellness visit, subsequent 12/24/2012  . Osteopenia due to cancer therapy 10/22/2011  . Personal history of breast cancer 04/13/2011  . Screening for cervical cancer 04/13/2011  . Hyperlipidemia   . GERD (gastroesophageal reflux disease)     Past Surgical History:  Procedure Laterality Date  . BREAST SURGERY     mastectomy, left breast   . MASTECTOMY Right 2000   BREAST CA     No current facility-administered medications for this encounter.   Current Outpatient Medications:  .  aspirin 81 MG tablet, Take 81 mg by mouth daily.  , Disp: , Rfl:  .  Multiple Vitamin  (MULTIVITAMIN) tablet, Take 1 tablet by mouth daily.  , Disp: , Rfl:  .  simvastatin (ZOCOR) 40 MG tablet, Take one tablet by mouth every evening, Disp: 90 tablet, Rfl: 2 .  Azelastine HCl 0.15 % SOLN, Place 2 sprays into the nose daily as needed., Disp: 30 mL, Rfl: 2 .  hydrocortisone 2.5 % cream, Apply topically 2 (two) times daily., Disp: 30 g, Rfl: 0  Allergies Patient has no known allergies.  Family History  Problem Relation Age of Onset  . Hypertension Mother   . Heart disease Father   . Cancer Maternal Grandmother   . Breast cancer Neg Hx     Social History Social History   Tobacco Use  . Smoking status: Never Smoker  . Smokeless tobacco: Never Used  Substance Use Topics  . Alcohol use: Yes    Comment: OCC  . Drug use: No    Review of Systems Constitutional: No fever ENT: No sore throat. Cardiovascular: Denies chest pain. Respiratory: Denies shortness of breath. Gastrointestinal: No abdominal pain.  No nausea, no vomiting.  No diarrhea.   Genitourinary: Negative for dysuria. Musculoskeletal: Negative for back pain. Skin: Negative for rash.   ____________________________________________   PHYSICAL EXAM:  VITAL SIGNS: ED Triage Vitals  Enc Vitals Group     BP 12/13/18 1013 135/67     Pulse Rate 12/13/18 1013 82     Resp 12/13/18 1013 18     Temp 12/13/18 1013 98.1 F (36.7 C)  Temp Source 12/13/18 1013 Oral     SpO2 12/13/18 1013 98 %     Weight 12/13/18 1011 165 lb (74.8 kg)     Height 12/13/18 1011 5' 7.5" (1.715 m)     Head Circumference --      Peak Flow --      Pain Score 12/13/18 1011 0     Pain Loc --      Pain Edu? --      Excl. in [place]? --     Constitutional: Alert and oriented. Well appearing and in no acute distress. Eyes: Conjunctivae are normal.  ENT      Head: Normocephalic and atraumatic. Cardiovascular: Normal rate, regular rhythm. Grossly normal heart sounds.  Good peripheral circulation. Respiratory: Normal respiratory  effort without tachypnea nor retractions. Breath sounds are clear and equal bilaterally. No wheezes, rales, rhonchi. Musculoskeletal:  Steady gait.  Neurologic:  Normal speech and language. Speech is normal. No gait instability.  Skin:  Skin is warm, dry. Psychiatric: Mood and affect are normal. Speech and behavior are normal. Patient exhibits appropriate insight and judgment   ___________________________________________   LABS (all labs ordered are listed, but only abnormal results are displayed)  Labs Reviewed  NOVEL CORONAVIRUS, NAA (HOSPITAL ORDER, SEND-OUT TO REF LAB)   ____________________________________________   PROCEDURES Procedures    INITIAL IMPRESSION / ASSESSMENT AND PLAN / ED COURSE  Pertinent labs & imaging results that were available during my care of the patient were reviewed by me and considered in my medical decision making (see chart for details).  Well-appearing patient.  No acute distress.  Husband at bedside.  Asymptomatic.  Request COVID-19 testing, testing performed.  [place] DHHS information given and instructed to follow and await testing results.  Discussed follow up and return parameters including no resolution or any worsening concerns. Patient verbalized understanding and agreed to plan.   ____________________________________________   FINAL CLINICAL IMPRESSION(S) / ED DIAGNOSES  Final diagnoses:  Advice Given About Covid-19 Virus Infection     ED Discharge Orders    None       Note: This dictation was prepared with Dragon dictation along with smaller phrase technology. Any transcriptional errors that result from this process are unintentional.         Marylene Land, NP 12/13/18 1152

## 2018-12-14 LAB — NOVEL CORONAVIRUS, NAA (HOSP ORDER, SEND-OUT TO REF LAB; TAT 18-24 HRS): SARS-CoV-2, NAA: NOT DETECTED

## 2018-12-15 ENCOUNTER — Encounter (HOSPITAL_COMMUNITY): Payer: Self-pay

## 2018-12-27 DIAGNOSIS — H2513 Age-related nuclear cataract, bilateral: Secondary | ICD-10-CM | POA: Diagnosis not present

## 2018-12-29 NOTE — Telephone Encounter (Signed)
err

## 2019-02-11 DIAGNOSIS — H2513 Age-related nuclear cataract, bilateral: Secondary | ICD-10-CM | POA: Diagnosis not present

## 2019-02-11 DIAGNOSIS — Z20828 Contact with and (suspected) exposure to other viral communicable diseases: Secondary | ICD-10-CM | POA: Diagnosis not present

## 2019-02-13 DIAGNOSIS — H2512 Age-related nuclear cataract, left eye: Secondary | ICD-10-CM | POA: Diagnosis not present

## 2019-02-13 DIAGNOSIS — H52222 Regular astigmatism, left eye: Secondary | ICD-10-CM | POA: Diagnosis not present

## 2019-02-13 DIAGNOSIS — H25812 Combined forms of age-related cataract, left eye: Secondary | ICD-10-CM | POA: Diagnosis not present

## 2019-02-25 DIAGNOSIS — Z01812 Encounter for preprocedural laboratory examination: Secondary | ICD-10-CM | POA: Diagnosis not present

## 2019-02-25 DIAGNOSIS — Z20828 Contact with and (suspected) exposure to other viral communicable diseases: Secondary | ICD-10-CM | POA: Diagnosis not present

## 2019-02-25 DIAGNOSIS — H2513 Age-related nuclear cataract, bilateral: Secondary | ICD-10-CM | POA: Diagnosis not present

## 2019-02-27 DIAGNOSIS — H2511 Age-related nuclear cataract, right eye: Secondary | ICD-10-CM | POA: Diagnosis not present

## 2019-02-27 DIAGNOSIS — H25811 Combined forms of age-related cataract, right eye: Secondary | ICD-10-CM | POA: Diagnosis not present

## 2019-03-07 DIAGNOSIS — Z23 Encounter for immunization: Secondary | ICD-10-CM | POA: Diagnosis not present

## 2019-03-20 ENCOUNTER — Telehealth: Payer: Self-pay | Admitting: *Deleted

## 2019-03-20 NOTE — Telephone Encounter (Signed)
Copied from [place] [phone]. Topic: Appointment Scheduling - Scheduling Inquiry for Clinic >> Mar 20, 2019  8:37 AM Mathis Bud wrote: Reason for CRM: Patient needs a referral to a foot doctor.  Patient messaged PCP on mychart, PCP wanted her to get an appt. PCP does not have anything till end of december. Call back .No results found for: [phone]

## 2019-03-21 ENCOUNTER — Encounter: Payer: Self-pay | Admitting: Internal Medicine

## 2019-03-21 ENCOUNTER — Ambulatory Visit (INDEPENDENT_AMBULATORY_CARE_PROVIDER_SITE_OTHER): Payer: Medicare Other | Admitting: Internal Medicine

## 2019-03-21 DIAGNOSIS — M79672 Pain in left foot: Secondary | ICD-10-CM | POA: Diagnosis not present

## 2019-03-21 NOTE — Progress Notes (Signed)
Telephone  Note  This visit type was conducted due to national recommendations for restrictions regarding the COVID-19 pandemic (e.g. social distancing).  This format is felt to be most appropriate for this patient at this time.  All issues noted in this document were discussed and addressed.  No physical exam was performed (except for noted visual exam findings with Video Visits).   I connected with@ on 03/21/19 at 11:00 AM EST by telephone and verified that I am speaking with the correct person using two identifiers. Location patient: home Location provider: work or home office Persons participating in the virtual visit: patient, provider  I discussed the limitations, risks, security and privacy concerns of performing an evaluation and management service by telephone and the availability of in person appointments. I also discussed with the patient that there may be a patient responsible charge related to this service. The patient expressed understanding and agreed to proceed.  Reason for visit: left foot pain   HPI:  The patient has no signs or symptoms of COVID 19 infection (fever, cough, sore throat  or shortness of breath beyond what is typical for patient).  Patient denies contact with other persons with the above mentioned symptoms or with anyone confirmed to have COVID 19.:   Left foot pain .  In April developed pain suggestive of PF.  Started wearing a boot at night and a lift during day and stopped walking for exercise.  Symptoms resolved after about 4 months.    Resumed walking one week ago and developed pain and swelling on the dorsum of left foot,  Without bruising.  Pain is present with weightbearing only,  And relieved with offloading.  No history of trauma.   ROS: See pertinent positives and negatives per HPI.  Past Medical History:  Diagnosis Date  . Breast cancer ([place]) 2000   RT MASTECTOMY  . GERD (gastroesophageal reflux disease)   . Hyperlipidemia   . Personal history  of chemotherapy 2000   BREAST CA  . Personal history of radiation therapy 2000   BREAST CA    Past Surgical History:  Procedure Laterality Date  . BREAST SURGERY     mastectomy, left breast   . MASTECTOMY Right 2000   BREAST CA    Family History  Problem Relation Age of Onset  . Hypertension Mother   . Heart disease Father   . Cancer Maternal Grandmother   . Breast cancer Neg Hx     SOCIAL HX: reports that she has never smoked. She has never used smokeless tobacco. She reports current alcohol use. She reports that she does not use drugs.  Current Outpatient Medications:  .  aspirin 81 MG tablet, Take 81 mg by mouth daily.  , Disp: , Rfl:  .  Multiple Vitamin (MULTIVITAMIN) tablet, Take 1 tablet by mouth daily.  , Disp: , Rfl:  .  simvastatin (ZOCOR) 40 MG tablet, Take one tablet by mouth every evening, Disp: 90 tablet, Rfl: 2 .  Azelastine HCl 0.15 % SOLN, Place 2 sprays into the nose daily as needed. (Patient not taking: Reported on 03/21/2019), Disp: 30 mL, Rfl: 2 .  hydrocortisone 2.5 % cream, Apply topically 2 (two) times daily. (Patient not taking: Reported on 03/21/2019), Disp: 30 g, Rfl: 0  EXAM:   General impression: alert, cooperative and articulate.  No signs of being in distress  Lungs: speech is fluent sentence length suggests that patient is not short of breath and not punctuated by cough, sneezing or sniffing. Marland Kitchen  Psych: affect normal.  speech is articulate and non pressured .  Denies suicidal thoughts   ASSESSMENT AND PLAN:  Discussed the following assessment and plan:  Acute pain of left foot - Plan: Ambulatory referral to Podiatry  Acute pain of left foot History initially supportive of Plantar fasciitis but more recent pain complaints suggest stress fracture of midfoot.  Referral to Dr Amalia Hailey [facility]     I discussed the assessment and treatment plan with the patient. The patient was provided an opportunity to ask questions and all were answered. The  patient agreed with the plan and demonstrated an understanding of the instructions.   The patient was advised to call back or seek an in-person evaluation if the symptoms worsen or if the condition fails to improve as anticipated.   I provided  25 minutes of non-face-to-face time during this encounter reviewing patient's current problems and post surgeries.  Providing counseling on the above mentioned problems , and coordination  of care .    Crecencio Mc, MD

## 2019-03-22 DIAGNOSIS — M79672 Pain in left foot: Secondary | ICD-10-CM | POA: Insufficient documentation

## 2019-03-22 NOTE — Assessment & Plan Note (Signed)
History initially supportive of Plantar fasciitis but more recent pain complaints suggest stress fracture of midfoot.  Referral to Dr Amalia Hailey [facility]

## 2019-03-28 ENCOUNTER — Other Ambulatory Visit: Payer: Self-pay

## 2019-03-28 DIAGNOSIS — H6123 Impacted cerumen, bilateral: Secondary | ICD-10-CM | POA: Diagnosis not present

## 2019-03-28 DIAGNOSIS — H903 Sensorineural hearing loss, bilateral: Secondary | ICD-10-CM | POA: Diagnosis not present

## 2019-04-03 ENCOUNTER — Ambulatory Visit (INDEPENDENT_AMBULATORY_CARE_PROVIDER_SITE_OTHER): Payer: Medicare Other | Admitting: Podiatry

## 2019-04-03 ENCOUNTER — Other Ambulatory Visit: Payer: Self-pay

## 2019-04-03 ENCOUNTER — Ambulatory Visit (INDEPENDENT_AMBULATORY_CARE_PROVIDER_SITE_OTHER): Payer: BC Managed Care – PPO

## 2019-04-03 DIAGNOSIS — M778 Other enthesopathies, not elsewhere classified: Secondary | ICD-10-CM | POA: Diagnosis not present

## 2019-04-03 MED ORDER — MELOXICAM 15 MG PO TABS: 15.0000 mg | ORAL_TABLET | Freq: Every day | ORAL | 1 refills | Status: DC

## 2019-04-05 NOTE — Progress Notes (Signed)
   HPI: 78 y.o. female presenting today as a new patient with a chief complaint of left dorsal and plantar midfoot pain that began over 8 months ago. She reports associated swelling. She describes the pain as sharp and dull aching. Walking increases the pain. She has been exercising the foot for treatment. Patient is here for further evaluation and treatment.   Past Medical History:  Diagnosis Date  . Breast cancer ([place]) 2000   RT MASTECTOMY  . GERD (gastroesophageal reflux disease)   . Hyperlipidemia   . Personal history of chemotherapy 2000   BREAST CA  . Personal history of radiation therapy 2000   BREAST CA     Physical Exam: General: The patient is alert and oriented x3 in no acute distress.  Dermatology: Skin is warm, dry and supple bilateral lower extremities. Negative for open lesions or macerations.  Vascular: Palpable pedal pulses bilaterally. No edema or erythema noted. Capillary refill within normal limits.  Neurological: Epicritic and protective threshold grossly intact bilaterally.   Musculoskeletal Exam: Pain with palpation noted to the left midfoot. Range of motion within normal limits to all pedal and ankle joints bilateral. Muscle strength 5/5 in all groups bilateral.   Radiographic Exam:  Normal osseous mineralization. Joint spaces preserved. No fracture/dislocation/boney destruction.    Assessment: 1. Left midfoot capsulitis    Plan of Care:  1. Patient evaluated. X-Rays reviewed.  2. Recommended good shoe gear.  3. Prescription for Meloxicam provided to patient. 4. Return to clinic as needed.      Edrick Kins, DPM [facility]  Dr. Edrick Kins, DPM    [address], [place] [address]                Office [phone]  Fax [phone]

## 2019-05-15 ENCOUNTER — Telehealth: Payer: Self-pay | Admitting: Internal Medicine

## 2019-05-15 NOTE — Telephone Encounter (Signed)
Left message for patient to call back and schedule Medicare Annual Wellness Visit (AWV) either virtually/audio only.  Last AWV 1.29.19 ; please schedule at anytime with Denisa O'Brien-Blaney at [facility] for [facility] to schedule

## 2019-05-18 ENCOUNTER — Other Ambulatory Visit: Payer: Self-pay

## 2019-05-18 ENCOUNTER — Ambulatory Visit (INDEPENDENT_AMBULATORY_CARE_PROVIDER_SITE_OTHER): Payer: Medicare Other

## 2019-05-18 VITALS — Ht 67.5 in | Wt 165.0 lb

## 2019-05-18 DIAGNOSIS — R5383 Other fatigue: Secondary | ICD-10-CM | POA: Diagnosis not present

## 2019-05-18 DIAGNOSIS — E782 Mixed hyperlipidemia: Secondary | ICD-10-CM | POA: Diagnosis not present

## 2019-05-18 DIAGNOSIS — Z0000 Encounter for general adult medical examination without abnormal findings: Secondary | ICD-10-CM

## 2019-05-18 NOTE — Patient Instructions (Addendum)
  Ms. Candanoza , Thank you for taking time to come for your Medicare Wellness Visit. I appreciate your ongoing commitment to your health goals. Please review the following plan we discussed and let me know if I can assist you in the future.   These are the goals we discussed: Goals    . Follow up with Primary Care Provider     As needed.       This is a list of the screening recommended for you and due dates:  Health Maintenance  Topic Date Due  . Tetanus Vaccine  11/20/2021  . Flu Shot  Completed  . DEXA scan (bone density measurement)  Completed  . Pneumonia vaccines  Completed

## 2019-05-18 NOTE — Progress Notes (Addendum)
Subjective:   Lori Caldwell is a 79 y.o. female who presents for Medicare Annual (Subsequent) preventive examination.  Review of Systems:  No ROS.  Medicare Wellness Virtual Visit.  Visual/audio telehealth visit, UTA vital signs.   Wt/Ht provided.  See social history for additional risk factors.   Cardiac Risk Factors include: advanced age (>57men, >14 women)     Objective:     Vitals: Ht 5' 7.5" (1.715 m)   Wt 165 lb (74.8 kg)   BMI 25.46 kg/m   Body mass index is 25.46 kg/m.  Advanced Directives 05/18/2019 05/31/2017 09/06/2016  Does Patient Have a Medical Advance Directive? Yes Yes No  Type of Paramedic of [place];Living will [place];Living will -  Does patient want to make changes to medical advance directive? No - Patient declined No - Patient declined -  Copy of [place] in Chart? Yes - validated most recent copy scanned in chart (See row information) No - copy requested -    Tobacco Social History   Tobacco Use  Smoking Status Never Smoker  Smokeless Tobacco Never Used     Counseling given: Not Answered   Clinical Intake:  Pre-visit preparation completed: Yes        Diabetes: No  How often do you need to have someone help you when you read instructions, pamphlets, or other written materials from your doctor or pharmacy?: 1 - Never  Interpreter Needed?: No     Past Medical History:  Diagnosis Date  . Breast cancer ([place]) 2000   RT MASTECTOMY  . GERD (gastroesophageal reflux disease)   . Hyperlipidemia   . Personal history of chemotherapy 2000   BREAST CA  . Personal history of radiation therapy 2000   BREAST CA   Past Surgical History:  Procedure Laterality Date  . BREAST SURGERY     mastectomy, left breast   . MASTECTOMY Right 2000   BREAST CA   Family History  Problem Relation Age of Onset  . Hypertension Mother   . Heart disease Father   . Cancer Maternal  Grandmother   . Breast cancer Neg Hx    Social History   Socioeconomic History  . Marital status: Married    Spouse name: Not on file  . Number of children: Not on file  . Years of education: Not on file  . Highest education level: Not on file  Occupational History  . Not on file  Tobacco Use  . Smoking status: Never Smoker  . Smokeless tobacco: Never Used  Substance and Sexual Activity  . Alcohol use: Yes    Comment: OCC  . Drug use: No  . Sexual activity: Not on file  Other Topics Concern  . Not on file  Social History Narrative   Married    Oceanographer    Social Determinants of Health   Financial Resource Strain: Low Risk   . Difficulty of Paying Living Expenses: Not hard at all  Food Insecurity: No Food Insecurity  . Worried About Charity fundraiser in the Last Year: Never true  . Ran Out of Food in the Last Year: Never true  Transportation Needs: No Transportation Needs  . Lack of Transportation (Medical): No  . Lack of Transportation (Non-Medical): No  Physical Activity: Insufficiently Active  . Days of Exercise per Week: 3 days  . Minutes of Exercise per Session: 30 min  Stress: No Stress Concern Present  . Feeling of  Stress : Not at all  Social Connections: Not Isolated  . Frequency of Communication with Friends and Family: More than three times a week  . Frequency of Social Gatherings with Friends and Family: More than three times a week  . Attends Religious Services: 1 to 4 times per year  . Active Member of Clubs or Organizations: Yes  . Attends Archivist Meetings: 1 to 4 times per year  . Marital Status: Married    Outpatient Encounter Medications as of 05/18/2019  Medication Sig  . aspirin 81 MG tablet Take 81 mg by mouth daily.    . Azelastine HCl 0.15 % SOLN Place 2 sprays into the nose daily as needed.  . hydrocortisone 2.5 % cream Apply topically 2 (two) times daily.  . meloxicam (MOBIC) 15 MG tablet Take 1 tablet (15 mg  total) by mouth daily.  . Multiple Vitamin (MULTIVITAMIN) tablet Take 1 tablet by mouth daily.    . simvastatin (ZOCOR) 40 MG tablet Take one tablet by mouth every evening   No facility-administered encounter medications on file as of 05/18/2019.    Activities of Daily Living In your present state of health, do you have any difficulty performing the following activities: 05/18/2019  Hearing? Y  Comment Hearing aids  Vision? N  Difficulty concentrating or making decisions? N  Walking or climbing stairs? N  Dressing or bathing? N  Doing errands, shopping? N  Preparing Food and eating ? N  Using the Toilet? N  In the past six months, have you accidently leaked urine? N  Do you have problems with loss of bowel control? N  Managing your Medications? N  Managing your Finances? N  Housekeeping or managing your Housekeeping? N  Some recent data might be hidden    Patient Care Team: Crecencio Mc, MD as PCP - General (Internal Medicine)    Assessment:   This is a routine wellness examination for [facility].  Nurse connected with patient 05/18/19 at  1:30 PM EST by a telephone enabled telemedicine application and verified that I am speaking with the correct person using two identifiers. Patient stated full name and DOB. Patient gave permission to continue with virtual visit. Patient's location was at home and Nurse's location was at [place] office.   Patient is alert and oriented x3. Patient denies difficulty focusing or concentrating. Patient likes to read, play the piano, knit and tutors for brain stimulation.   Health Maintenance Due: -Dexa Scan- due 2021 -Mammogram- scheduled 06/22/19. See completed HM at the end of note.   Eye: Visual acuity not assessed. Virtual visit. Followed by their ophthalmologist.  Dental: Visits every  months.    Hearing: Hearing aids- yes  Safety:  Patient feels safe at home- yes Patient does have smoke detectors at home- yes Patient does wear  sunscreen or protective clothing when in direct sunlight - yes Patient does wear seat belt when in a moving vehicle - yes Patient drives- yes Adequate lighting in walkways free from debris- yes Grab bars and handrails used as appropriate- yes Ambulates with an assistive device- no Cell phone on person when ambulating outside of the home- yes  Social: Alcohol intake - yes      Smoking history- never   Smokers in home? none Illicit drug use? none  Medication: Taking as directed and without issues.  Self managed - yes   Covid-19: Precautions and sickness symptoms discussed. Wears mask, social distancing, hand hygiene as appropriate.   Activities of Daily  Living Patient denies needing assistance with: household chores, feeding themselves, getting from bed to chair, getting to the toilet, bathing/showering, dressing, managing money, or preparing meals.   Discussed the importance of a healthy diet, water intake and the benefits of aerobic exercise.  Physical activity- walking, upper body exercises, online active exercise  Diet:  Regular Water: good intake  Other Providers Patient Care Team: Crecencio Mc, MD as PCP - General (Internal Medicine)  Exercise Activities and Dietary recommendations Current Exercise Habits: Home exercise routine, Type of exercise: walking(upper body exercises 10 minutes. Online senior exercises 20 minutes.), Frequency (Times/Week): 3, Intensity: Mild  Goals    . Follow up with Primary Care Provider     As needed.       Fall Risk Fall Risk  05/18/2019 03/28/2019 03/21/2019 12/04/2018 05/31/2017  Falls in the past year? 0 0 0 (No Data) No  Comment - Emmi Telephone Survey: data to providers prior to load - Emmi Telephone Survey: data to providers prior to load -  Number falls in past yr: - - - (No Data) -  Comment - - - Emmi Telephone Survey Actual Response =  -  Injury with Fall? - - - - -  Follow up Falls evaluation completed - Falls evaluation  completed - -   Timed Get Up and Go performed: no, virtual visit  Depression Screen PHQ 2/9 Scores 05/18/2019 05/18/2019 05/31/2017 11/01/2016  PHQ - 2 Score 0 0 0 0  PHQ- 9 Score - - 0 -     Cognitive Function MMSE - Mini Mental State Exam 05/31/2017  Orientation to time 5  Orientation to Place 5  Registration 3  Attention/ Calculation 5  Recall 3  Language- name 2 objects 2  Language- repeat 1  Language- follow 3 step command 3  Language- read & follow direction 1  Write a sentence 1  Copy design 1  Total score 30     6CIT Screen 05/18/2019  What Year? 0 points  What month? 0 points  What time? 0 points  Count back from 20 0 points  Months in reverse 0 points  Repeat phrase 0 points  Total Score 0    Immunization History  Administered Date(s) Administered  . Influenza Split 02/19/2011  . Influenza,inj,Quad PF,6+ Mos 01/15/2015  . Influenza-Unspecified 03/03/2013, 04/02/2014, 02/17/2016, 02/07/2017, 03/07/2018, 03/09/2019  . PFIZER SARS-COV-2 Vaccination 05/09/2019  . Pneumococcal Conjugate-13 12/28/2013  . Pneumococcal Polysaccharide-23 12/22/2007, 12/21/2012  . Tdap 11/21/2011  . Zoster 11/20/2012  . Zoster Recombinat (Shingrix) 08/24/2017, 10/24/2017   Screening Tests Health Maintenance  Topic Date Due  . TETANUS/TDAP  11/20/2021  . INFLUENZA VACCINE  Completed  . DEXA SCAN  Completed  . PNA vac Low Risk Adult  Completed      Plan:   Keep all routine maintenance appointments.   Next scheduled fasting lab 06/18/19 @ 945. Labs ordered.   Cpe 06/20/19 @ 2:00.  Medicare Attestation I have personally reviewed: The patient's medical and social history Their use of alcohol, tobacco or illicit drugs Their current medications and supplements The patient's functional ability including ADLs,fall risks, home safety risks, cognitive, and hearing and visual impairment Diet and physical activities Evidence for depression    I have reviewed and discussed with  patient certain preventive protocols, quality metrics, and best practice recommendations.     OBrien-Blaney, Anah Billard L, LPN  [id number]    I have reviewed the above information and agree with above.   Deborra Medina, MD

## 2019-05-22 ENCOUNTER — Ambulatory Visit
Admission: RE | Admit: 2019-05-22 | Discharge: 2019-05-22 | Disposition: A | Discharge status: Home / Self Care | Payer: Medicare Other | Source: Ambulatory Visit | Attending: Internal Medicine | Admitting: Internal Medicine

## 2019-05-22 DIAGNOSIS — Z1231 Encounter for screening mammogram for malignant neoplasm of breast: Secondary | ICD-10-CM

## 2019-06-04 ENCOUNTER — Ambulatory Visit: Payer: Self-pay | Admitting: Internal Medicine

## 2019-06-18 ENCOUNTER — Other Ambulatory Visit: Payer: Self-pay

## 2019-06-18 ENCOUNTER — Other Ambulatory Visit (INDEPENDENT_AMBULATORY_CARE_PROVIDER_SITE_OTHER): Payer: Medicare Other

## 2019-06-18 DIAGNOSIS — R5383 Other fatigue: Secondary | ICD-10-CM | POA: Diagnosis not present

## 2019-06-18 DIAGNOSIS — E782 Mixed hyperlipidemia: Secondary | ICD-10-CM | POA: Diagnosis not present

## 2019-06-18 LAB — LIPID PANEL
Cholesterol: 187 mg/dL (ref 0–200)
HDL: 69 mg/dL (ref >39.00)
LDL Cholesterol: 88 mg/dL (ref 0–99)
NonHDL: 117.77
Total CHOL/HDL Ratio: 3
Triglycerides: 147 mg/dL (ref 0.0–149.0)
VLDL: 29.4 mg/dL (ref 0.0–40.0)

## 2019-06-18 LAB — CBC WITH DIFFERENTIAL/PLATELET
Basophils Absolute: 0 10*3/uL (ref 0.0–0.1)
Basophils Relative: 0.5 % (ref 0.0–3.0)
Eosinophils Absolute: 0 10*3/uL (ref 0.0–0.7)
Eosinophils Relative: 0.4 % (ref 0.0–5.0)
HCT: 42.4 % (ref 36.0–46.0)
Hemoglobin: 14.4 g/dL (ref 12.0–15.0)
Lymphocytes Relative: 27.8 % (ref 12.0–46.0)
Lymphs Abs: 1.8 10*3/uL (ref 0.7–4.0)
MCHC: 33.9 g/dL (ref 30.0–36.0)
MCV: 99.4 fl (ref 78.0–100.0)
Monocytes Absolute: 0.4 10*3/uL (ref 0.1–1.0)
Monocytes Relative: 6.8 % (ref 3.0–12.0)
Neutro Abs: 4.1 10*3/uL (ref 1.4–7.7)
Neutrophils Relative %: 64.5 % (ref 43.0–77.0)
Platelets: 187 10*3/uL (ref 150.0–400.0)
RBC: 4.27 Mil/uL (ref 3.87–5.11)
RDW: 12.9 % (ref 11.5–15.5)
WBC: 6.4 10*3/uL (ref 4.0–10.5)

## 2019-06-18 LAB — COMPREHENSIVE METABOLIC PANEL
ALT: 17 U/L (ref 0–35)
AST: 22 U/L (ref 0–37)
Albumin: 4.2 g/dL (ref 3.5–5.2)
Alkaline Phosphatase: 52 U/L (ref 39–117)
BUN: 15 mg/dL (ref 6–23)
CO2: 26 mEq/L (ref 19–32)
Calcium: 9.5 mg/dL (ref 8.4–10.5)
Chloride: 104 mEq/L (ref 96–112)
Creatinine, Ser: 0.87 mg/dL (ref 0.40–1.20)
GFR: 63.03 mL/min (ref >60.00)
Glucose, Bld: 89 mg/dL (ref 70–99)
Potassium: 4.2 mEq/L (ref 3.5–5.1)
Sodium: 137 mEq/L (ref 135–145)
Total Bilirubin: 0.7 mg/dL (ref 0.2–1.2)
Total Protein: 7.2 g/dL (ref 6.0–8.3)

## 2019-06-18 LAB — TSH: TSH: 4.01 u[IU]/mL (ref 0.35–4.50)

## 2019-06-20 ENCOUNTER — Encounter: Payer: Self-pay | Admitting: Internal Medicine

## 2019-06-20 ENCOUNTER — Other Ambulatory Visit: Payer: Self-pay

## 2019-06-20 ENCOUNTER — Ambulatory Visit (INDEPENDENT_AMBULATORY_CARE_PROVIDER_SITE_OTHER): Payer: Medicare Other | Admitting: Internal Medicine

## 2019-06-20 VITALS — BP 122/74 | HR 82 | Temp 96.5°F | Resp 15 | Ht 67.5 in | Wt 163.6 lb

## 2019-06-20 DIAGNOSIS — Z780 Asymptomatic menopausal state: Secondary | ICD-10-CM | POA: Diagnosis not present

## 2019-06-20 DIAGNOSIS — Z853 Personal history of malignant neoplasm of breast: Secondary | ICD-10-CM

## 2019-06-20 DIAGNOSIS — M8580 Other specified disorders of bone density and structure, unspecified site: Secondary | ICD-10-CM | POA: Diagnosis not present

## 2019-06-20 DIAGNOSIS — M79672 Pain in left foot: Secondary | ICD-10-CM

## 2019-06-20 NOTE — Patient Instructions (Signed)
You are due for your bone density test this year.  I will order this for you  Your labs are excellent!  Your cholesterol is fine!   Health Maintenance After Age 78 After age 68, you are at a higher risk for certain long-term diseases and infections as well as injuries from falls. Falls are a major cause of broken bones and head injuries in people who are older than age 84. Getting regular preventive care can help to keep you healthy and well. Preventive care includes getting regular testing and making lifestyle changes as recommended by your health care provider. Talk with your health care provider about:  Which screenings and tests you should have. A screening is a test that checks for a disease when you have no symptoms.  A diet and exercise plan that is right for you. What should I know about screenings and tests to prevent falls? Screening and testing are the best ways to find a health problem early. Early diagnosis and treatment give you the best chance of managing medical conditions that are common after age 59. Certain conditions and lifestyle choices may make you more likely to have a fall. Your health care provider may recommend:  Regular vision checks. Poor vision and conditions such as cataracts can make you more likely to have a fall. If you wear glasses, make sure to get your prescription updated if your vision changes.  Medicine review. Work with your health care provider to regularly review all of the medicines you are taking, including over-the-counter medicines. Ask your health care provider about any side effects that may make you more likely to have a fall. Tell your health care provider if any medicines that you take make you feel dizzy or sleepy.  Osteoporosis screening. Osteoporosis is a condition that causes the bones to get weaker. This can make the bones weak and cause them to break more easily.  Blood pressure screening. Blood pressure changes and medicines to control  blood pressure can make you feel dizzy.  Strength and balance checks. Your health care provider may recommend certain tests to check your strength and balance while standing, walking, or changing positions.  Foot health exam. Foot pain and numbness, as well as not wearing proper footwear, can make you more likely to have a fall.  Depression screening. You may be more likely to have a fall if you have a fear of falling, feel emotionally low, or feel unable to do activities that you used to do.  Alcohol use screening. Using too much alcohol can affect your balance and may make you more likely to have a fall. What actions can I take to lower my risk of falls? General instructions  Talk with your health care provider about your risks for falling. Tell your health care provider if: ? You fall. Be sure to tell your health care provider about all falls, even ones that seem minor. ? You feel dizzy, sleepy, or off-balance.  Take over-the-counter and prescription medicines only as told by your health care provider. These include any supplements.  Eat a healthy diet and maintain a healthy weight. A healthy diet includes low-fat dairy products, low-fat (lean) meats, and fiber from whole grains, beans, and lots of fruits and vegetables. Home safety  Remove any tripping hazards, such as rugs, cords, and clutter.  Install safety equipment such as grab bars in bathrooms and safety rails on stairs.  Keep rooms and walkways well-lit. Activity   Follow a regular exercise program to  stay fit. This will help you maintain your balance. Ask your health care provider what types of exercise are appropriate for you.  If you need a cane or walker, use it as recommended by your health care provider.  Wear supportive shoes that have nonskid soles. Lifestyle  Do not drink alcohol if your health care provider tells you not to drink.  If you drink alcohol, limit how much you have: ? 0-1 drink a day for  women. ? 0-2 drinks a day for men.  Be aware of how much alcohol is in your drink. In the [place] one drink equals one typical bottle of beer (12 oz), one-half glass of wine (5 oz), or one shot of hard liquor (1 oz).  Do not use any products that contain nicotine or tobacco, such as cigarettes and e-cigarettes. If you need help quitting, ask your health care provider. Summary  Having a healthy lifestyle and getting preventive care can help to protect your health and wellness after age 48.  Screening and testing are the best way to find a health problem early and help you avoid having a fall. Early diagnosis and treatment give you the best chance for managing medical conditions that are more common for people who are older than age 61.  Falls are a major cause of broken bones and head injuries in people who are older than age 80. Take precautions to prevent a fall at home.  Work with your health care provider to learn what changes you can make to improve your health and wellness and to prevent falls. This information is not intended to replace advice given to you by your health care provider. Make sure you discuss any questions you have with your health care provider. Document Revised: 08/10/2018 Document Reviewed: 03/02/2017 Elsevier Patient Education  2020 [employer].

## 2019-06-20 NOTE — Progress Notes (Signed)
Patient ID: Lori Caldwell, female    DOB: 1941/04/19  Age: 79 y.o. MRN: [id number]  The patient is here for annual follow up and  management of other chronic and acute problems.  This visit occurred during the SARS-CoV-2 public health emergency.  Safety protocols were in place, including screening questions prior to the visit, additional usage of staff PPE, and extensive cleaning of exam room while observing appropriate contact time as indicated for disinfecting solutions.     The risk factors are reflected in the social history.  The roster of all physicians providing medical care to patient - is listed in the Snapshot section of the chart.  Activities of daily living:  The patient is 100% independent in all ADLs: dressing, toileting, feeding as well as independent mobility  Home safety : The patient has smoke detectors in the home. They wear seatbelts.  There are no firearms at home. There is no violence in the home.   There is no risks for hepatitis, STDs or HIV. There is no   history of blood transfusion. They have no travel history to infectious disease endemic areas of the world.  The patient has seen their dentist in the last six month. They have seen their eye doctor in the last year. They admit to slight hearing difficulty DESPITE wearing a heating aide ,, aggravated by use of masks .  They do not  have excessive sun exposure. Discussed the need for sun protection: hats, long sleeves and use of sunscreen if there is significant sun exposure.   Diet: the importance of a healthy diet is discussed. They do have a healthy diet.  The benefits of regular aerobic exercise were discussed. She walks 4 times per week ,  20 minutes.   Depression screen: there are no signs or vegative symptoms of depression- irritability, change in appetite, anhedonia, sadness/tearfullness.  Cognitive assessment: the patient manages all their financial and personal affairs and is actively engaged. They could  relate day,date,year and events; recalled 2/3 objects at 3 minutes; performed clock-face test normally.  The following portions of the patient's history were reviewed and updated as appropriate: allergies, current medications, past family history, past medical history,  past surgical history, past social history  and problem list.  Visual acuity was not assessed per patient preference since she has regular follow up with her ophthalmologist. Hearing and body mass index were assessed and reviewed.   During the course of the visit the patient was educated and counseled about appropriate screening and preventive services including : fall prevention , diabetes screening, nutrition counseling, colorectal cancer screening, and recommended immunizations.    CC: The primary encounter diagnosis was Postmenopausal estrogen deficiency. Diagnoses of Personal history of breast cancer, Osteopenia due to cancer therapy, and Acute pain of left foot were also pertinent to this visit.  1) left foot pain  invovling the arch,  Aggravated by walking > 1 mile .  Not occurring daily .  No swelling or bruising. Saw podiatry,  Advised to wear New Balance shoes when walking.    History Marise has a past medical history of Breast cancer ([place]) (2000), GERD (gastroesophageal reflux disease), Hyperlipidemia, Personal history of chemotherapy (2000), and Personal history of radiation therapy (2000).   She has a past surgical history that includes Breast surgery and Mastectomy (Right, 2000).   Her family history includes Cancer in her maternal grandmother; Heart disease in her father; Hypertension in her mother.She reports that she has never smoked. She has never  used smokeless tobacco. She reports current alcohol use. She reports that she does not use drugs.  Outpatient Medications Prior to Visit  Medication Sig Dispense Refill  . aspirin 81 MG tablet Take 81 mg by mouth daily.      . Azelastine HCl 0.15 % SOLN Place 2  sprays into the nose daily as needed. 30 mL 2  . hydrocortisone 2.5 % cream Apply topically 2 (two) times daily. 30 g 0  . meloxicam (MOBIC) 15 MG tablet Take 1 tablet (15 mg total) by mouth daily. 30 tablet 1  . Multiple Vitamin (MULTIVITAMIN) tablet Take 1 tablet by mouth daily.      . simvastatin (ZOCOR) 40 MG tablet Take one tablet by mouth every evening 90 tablet 2   No facility-administered medications prior to visit.    Review of Systems   Patient denies headache, fevers, malaise, unintentional weight loss, skin rash, eye pain, sinus congestion and sinus pain, sore throat, dysphagia,  hemoptysis , cough, dyspnea, wheezing, chest pain, palpitations, orthopnea, edema, abdominal pain, nausea, melena, diarrhea, constipation, flank pain, dysuria, hematuria, urinary  Frequency, nocturia, numbness, tingling, seizures,  Focal weakness, Loss of consciousness,  Tremor, insomnia, depression, anxiety, and suicidal ideation.     Objective:  BP 122/74 (BP Location: Left Arm, Patient Position: Sitting, Cuff Size: Normal)   Pulse 82   Temp (!) 96.5 F (35.8 C) (Temporal)   Resp 15   Ht 5' 7.5" (1.715 m)   Wt 163 lb 9.6 oz (74.2 kg)   SpO2 98%   BMI 25.25 kg/m   Physical Exam    General appearance: alert, cooperative and appears stated age Head: Normocephalic, without obvious abnormality, atraumatic Eyes: conjunctivae/corneas clear. PERRL, EOM's intact. Fundi benign. Ears: normal TM's and external ear canals both ears Nose: Nares normal. Septum midline. Mucosa normal. No drainage or sinus tenderness. Throat: lips, mucosa, and tongue normal; teeth and gums normal Neck: no adenopathy, no carotid bruit, no JVD, supple, symmetrical, trachea midline and thyroid not enlarged, symmetric, no tenderness/mass/nodules Lungs: clear to auscultation bilaterally Breasts: right mastectomy,   No masses on chest wall.  Left breast: normal appearance, no masses or tenderness Heart: regular rate and rhythm,  S1, S2 normal, no murmur, click, rub or gallop Abdomen: soft, non-tender; bowel sounds normal; no masses,  no organomegaly Extremities: extremities normal, atraumatic, no cyanosis or edema Pulses: 2+ and symmetric Skin: Skin color, texture, turgor normal. No rashes or lesions Neurologic: Alert and oriented X 3, normal strength and tone. Normal symmetric reflexes. Normal coordination and gait.      Assessment & Plan:   Problem List Items Addressed This Visit      Unprioritized   Personal history of breast cancer    She has been cancer free since 2001 . Continue annul screening mammograms of left breast       Osteopenia due to cancer therapy    Due for 5 yr repeat       Acute pain of left foot    Diagnosed with capsulitis by podiatry.  Advised to use NSAIDs/tylenol prn and wear New Balance Shoes        Other Visit Diagnoses    Postmenopausal estrogen deficiency    -  Primary   Relevant Orders   DG Bone Density      I am having [name] maintain her aspirin, multivitamin, Azelastine HCl, simvastatin, hydrocortisone, and meloxicam.  No orders of the defined types were placed in this encounter.   A  total of 40 minutes was spent with patient more than half of which was spent in counseling patient on the above mentioned issues , reviewing and explaining recent labs and imaging studies done, and coordination of care.   There are no discontinued medications.  Follow-up: No follow-ups on file.   Crecencio Mc, MD

## 2019-06-21 NOTE — Assessment & Plan Note (Signed)
She has been cancer free since 2001 . Continue annul screening mammograms of left breast

## 2019-06-21 NOTE — Assessment & Plan Note (Signed)
Diagnosed with capsulitis by podiatry.  Advised to use NSAIDs/tylenol prn and wear New Balance Shoes

## 2019-06-21 NOTE — Assessment & Plan Note (Signed)
Due for 5 yr repeat

## 2019-07-18 DIAGNOSIS — H02834 Dermatochalasis of left upper eyelid: Secondary | ICD-10-CM | POA: Diagnosis not present

## 2019-07-18 DIAGNOSIS — H02831 Dermatochalasis of right upper eyelid: Secondary | ICD-10-CM | POA: Diagnosis not present

## 2019-07-23 DIAGNOSIS — C44319 Basal cell carcinoma of skin of other parts of face: Secondary | ICD-10-CM | POA: Diagnosis not present

## 2019-07-23 DIAGNOSIS — D485 Neoplasm of uncertain behavior of skin: Secondary | ICD-10-CM | POA: Diagnosis not present

## 2019-07-23 DIAGNOSIS — L821 Other seborrheic keratosis: Secondary | ICD-10-CM | POA: Diagnosis not present

## 2019-07-23 DIAGNOSIS — Z85828 Personal history of other malignant neoplasm of skin: Secondary | ICD-10-CM | POA: Diagnosis not present

## 2019-07-23 DIAGNOSIS — D225 Melanocytic nevi of trunk: Secondary | ICD-10-CM | POA: Diagnosis not present

## 2019-07-31 ENCOUNTER — Encounter: Payer: Self-pay | Admitting: Internal Medicine

## 2019-08-08 DIAGNOSIS — H903 Sensorineural hearing loss, bilateral: Secondary | ICD-10-CM | POA: Diagnosis not present

## 2019-08-08 DIAGNOSIS — H6123 Impacted cerumen, bilateral: Secondary | ICD-10-CM | POA: Diagnosis not present

## 2019-08-20 DIAGNOSIS — H02831 Dermatochalasis of right upper eyelid: Secondary | ICD-10-CM | POA: Diagnosis not present

## 2019-08-20 DIAGNOSIS — Z20822 Contact with and (suspected) exposure to covid-19: Secondary | ICD-10-CM | POA: Diagnosis not present

## 2019-08-20 DIAGNOSIS — H02834 Dermatochalasis of left upper eyelid: Secondary | ICD-10-CM | POA: Diagnosis not present

## 2019-08-22 DIAGNOSIS — H02834 Dermatochalasis of left upper eyelid: Secondary | ICD-10-CM | POA: Diagnosis not present

## 2019-08-22 DIAGNOSIS — H02831 Dermatochalasis of right upper eyelid: Secondary | ICD-10-CM | POA: Diagnosis not present

## 2019-09-13 ENCOUNTER — Other Ambulatory Visit: Payer: Self-pay | Admitting: Internal Medicine

## 2019-09-22 NOTE — Telephone Encounter (Signed)
I'm not sure what the issue is.   Her last DEXA was in 2016 At [facility].  She has osteopenia so this is a follow up on osteopenia but we have always used the code for post menopausal  estrogen deificiency. To get it covere.   I see that the current one was ordered for [facility]   Did she request that it be done at [facility]?   I prefer that it be done at [place] if it will be covered by her insurance

## 2019-11-14 DIAGNOSIS — H6123 Impacted cerumen, bilateral: Secondary | ICD-10-CM | POA: Diagnosis not present

## 2019-12-15 ENCOUNTER — Other Ambulatory Visit: Payer: Self-pay | Admitting: Internal Medicine

## 2019-12-18 ENCOUNTER — Telehealth: Payer: Self-pay | Admitting: Internal Medicine

## 2019-12-18 NOTE — Telephone Encounter (Signed)
Pt need called in need a order for bone density test sent to [facility]  fax number is [phone]

## 2019-12-18 NOTE — Telephone Encounter (Signed)
Printed signed and faxed

## 2020-01-04 DIAGNOSIS — C44719 Basal cell carcinoma of skin of left lower limb, including hip: Secondary | ICD-10-CM | POA: Diagnosis not present

## 2020-01-04 DIAGNOSIS — Z85828 Personal history of other malignant neoplasm of skin: Secondary | ICD-10-CM | POA: Diagnosis not present

## 2020-01-04 DIAGNOSIS — D485 Neoplasm of uncertain behavior of skin: Secondary | ICD-10-CM | POA: Diagnosis not present

## 2020-01-04 DIAGNOSIS — L720 Epidermal cyst: Secondary | ICD-10-CM | POA: Diagnosis not present

## 2020-01-11 DIAGNOSIS — Z780 Asymptomatic menopausal state: Secondary | ICD-10-CM | POA: Diagnosis not present

## 2020-01-11 DIAGNOSIS — M8588 Other specified disorders of bone density and structure, other site: Secondary | ICD-10-CM | POA: Diagnosis not present

## 2020-01-11 DIAGNOSIS — M85852 Other specified disorders of bone density and structure, left thigh: Secondary | ICD-10-CM | POA: Diagnosis not present

## 2020-01-11 DIAGNOSIS — M8589 Other specified disorders of bone density and structure, multiple sites: Secondary | ICD-10-CM | POA: Diagnosis not present

## 2020-01-20 ENCOUNTER — Telehealth: Payer: Self-pay | Admitting: Internal Medicine

## 2020-01-20 DIAGNOSIS — M8580 Other specified disorders of bone density and structure, unspecified site: Secondary | ICD-10-CM

## 2020-01-20 NOTE — Assessment & Plan Note (Signed)
Repeat dexa done Sept  2021.    T score -2.1 spine T score -1.8 hip

## 2020-02-04 DIAGNOSIS — C44719 Basal cell carcinoma of skin of left lower limb, including hip: Secondary | ICD-10-CM | POA: Diagnosis not present

## 2020-02-06 DIAGNOSIS — Z23 Encounter for immunization: Secondary | ICD-10-CM | POA: Diagnosis not present

## 2020-03-18 DIAGNOSIS — C44319 Basal cell carcinoma of skin of other parts of face: Secondary | ICD-10-CM | POA: Diagnosis not present

## 2020-03-24 ENCOUNTER — Other Ambulatory Visit: Payer: Self-pay | Admitting: Internal Medicine

## 2020-03-31 DIAGNOSIS — H26493 Other secondary cataract, bilateral: Secondary | ICD-10-CM | POA: Diagnosis not present

## 2020-04-21 DIAGNOSIS — H903 Sensorineural hearing loss, bilateral: Secondary | ICD-10-CM | POA: Diagnosis not present

## 2020-04-21 DIAGNOSIS — H6123 Impacted cerumen, bilateral: Secondary | ICD-10-CM | POA: Diagnosis not present

## 2020-05-20 ENCOUNTER — Ambulatory Visit: Payer: Medicare Other

## 2020-05-20 ENCOUNTER — Telehealth: Payer: Self-pay

## 2020-05-20 NOTE — Telephone Encounter (Signed)
Unsuccessful attempt to each patient for scheduled awv. No answer. Left voicemail to call the office back and reschedule.

## 2020-05-21 ENCOUNTER — Telehealth: Payer: Self-pay

## 2020-05-21 ENCOUNTER — Ambulatory Visit: Payer: Medicare Other

## 2020-05-21 DIAGNOSIS — Z1231 Encounter for screening mammogram for malignant neoplasm of breast: Secondary | ICD-10-CM

## 2020-05-21 NOTE — Telephone Encounter (Signed)
Unsuccessful attempt to reach patient for scheduled awv. No answer. Unable to leave message.

## 2020-05-21 NOTE — Telephone Encounter (Signed)
I scheduled pt's wellness exam but she would like to know about scheduling her mammogram. Please advise

## 2020-05-27 ENCOUNTER — Encounter: Payer: Self-pay | Admitting: *Deleted

## 2020-06-23 ENCOUNTER — Other Ambulatory Visit: Payer: Self-pay

## 2020-06-23 ENCOUNTER — Ambulatory Visit (INDEPENDENT_AMBULATORY_CARE_PROVIDER_SITE_OTHER): Payer: Medicare Other | Admitting: Internal Medicine

## 2020-06-23 ENCOUNTER — Encounter: Payer: Self-pay | Admitting: Internal Medicine

## 2020-06-23 VITALS — BP 132/68 | HR 77 | Temp 97.5°F | Resp 15 | Ht 67.5 in | Wt 164.4 lb

## 2020-06-23 DIAGNOSIS — K219 Gastro-esophageal reflux disease without esophagitis: Secondary | ICD-10-CM | POA: Diagnosis not present

## 2020-06-23 DIAGNOSIS — Z9011 Acquired absence of right breast and nipple: Secondary | ICD-10-CM | POA: Diagnosis not present

## 2020-06-23 DIAGNOSIS — E782 Mixed hyperlipidemia: Secondary | ICD-10-CM | POA: Diagnosis not present

## 2020-06-23 DIAGNOSIS — C44319 Basal cell carcinoma of skin of other parts of face: Secondary | ICD-10-CM | POA: Diagnosis not present

## 2020-06-23 DIAGNOSIS — M8580 Other specified disorders of bone density and structure, unspecified site: Secondary | ICD-10-CM

## 2020-06-23 DIAGNOSIS — Z853 Personal history of malignant neoplasm of breast: Secondary | ICD-10-CM | POA: Diagnosis not present

## 2020-06-23 NOTE — Patient Instructions (Addendum)
For joint pain:  You can take up to 2000 mg of acetominophen (tylenol) every day safely  In divided doses (500 mg every 6 hours  Or 1000 mg every 12 hours.)  You can add ADVIL for acute pain   But do not use long term   For your constipation:  The first course is to increase the fiber in your diet to 25 g daily . Fruit and vegetables are good sources,  The [employer] and Atkins protein bars , and the low carb breads   (see below)  are all heavy on  fiber,    You can also take miralax, metamucil, fibercon, or citrucel daily to supplement your fiber. These are gentle and work in 1 to 2 days to relieve constipation, and  you can also combine them daily with colace,  A stool softener .  Also,  make 60 ounces  of wtaer your minimum goal for water intake     HERE ARE THE LOW CARB  BREAD CHOICES  THAT ARE HIGHER IN FIBER THAN THE WELL KNOWN BREADS.  The [employer] MADE FROM WHOLE WHEAT HAS 26 G FIBER ! THESE CAN ALLALSO  BE BAKED TO [place] Maintenance After Age 53 After age 59, you are at a higher risk for certain long-term diseases and infections as well as injuries from falls. Falls are a major cause of broken bones and head injuries in people who are older than age 21. Getting regular preventive care can help to keep you healthy and well. Preventive care includes getting regular testing and making lifestyle changes as recommended by your health care provider. Talk with your health care provider about:  Which screenings and tests you should have. A screening is a test that checks for a disease when you have no symptoms.  A diet and exercise plan that is right for you. What should I know about screenings and tests to prevent falls? Screening and testing are the best ways to find a health problem early. Early diagnosis and treatment give you the best chance of managing medical conditions that are common after age 29. Certain conditions and lifestyle choices may make  you more likely to have a fall. Your health care provider may recommend:  Regular vision checks. Poor vision and conditions such as cataracts can make you more likely to have a fall. If you wear glasses, make sure to get your prescription updated if your vision changes.  Medicine review. Work with your health care provider to regularly review all of the medicines you are taking, including over-the-counter medicines. Ask your health care provider about any side effects that may make you more likely to have a fall. Tell your health care provider if any medicines that you take make you feel dizzy or sleepy.  Osteoporosis screening. Osteoporosis is a condition that causes the bones to get weaker. This can make the bones weak and cause them to break more easily.  Blood pressure screening. Blood pressure changes and medicines to control blood pressure can make you feel dizzy.  Strength and balance checks. Your health care provider may recommend certain tests to check your strength and balance while standing, walking, or changing positions.  Foot health exam. Foot pain and numbness, as well as not wearing proper footwear, can make you more likely to have a fall.  Depression screening. You may be more likely to have a fall if you have a fear of falling, feel  emotionally low, or feel unable to do activities that you used to do.  Alcohol use screening. Using too much alcohol can affect your balance and may make you more likely to have a fall. What actions can I take to lower my risk of falls? General instructions  Talk with your health care provider about your risks for falling. Tell your health care provider if: ? You fall. Be sure to tell your health care provider about all falls, even ones that seem minor. ? You feel dizzy, sleepy, or off-balance.  Take over-the-counter and prescription medicines only as told by your health care provider. These include any supplements.  Eat a healthy diet and  maintain a healthy weight. A healthy diet includes low-fat dairy products, low-fat (lean) meats, and fiber from whole grains, beans, and lots of fruits and vegetables. Home safety  Remove any tripping hazards, such as rugs, cords, and clutter.  Install safety equipment such as grab bars in bathrooms and safety rails on stairs.  Keep rooms and walkways well-lit. Activity  Follow a regular exercise program to stay fit. This will help you maintain your balance. Ask your health care provider what types of exercise are appropriate for you.  If you need a cane or walker, use it as recommended by your health care provider.  Wear supportive shoes that have nonskid soles.   Lifestyle  Do not drink alcohol if your health care provider tells you not to drink.  If you drink alcohol, limit how much you have: ? 0-1 drink a day for women. ? 0-2 drinks a day for men.  Be aware of how much alcohol is in your drink. In the [place] one drink equals one typical bottle of beer (12 oz), one-half glass of wine (5 oz), or one shot of hard liquor (1 oz).  Do not use any products that contain nicotine or tobacco, such as cigarettes and e-cigarettes. If you need help quitting, ask your health care provider. Summary  Having a healthy lifestyle and getting preventive care can help to protect your health and wellness after age 56.  Screening and testing are the best way to find a health problem early and help you avoid having a fall. Early diagnosis and treatment give you the best chance for managing medical conditions that are more common for people who are older than age 88.  Falls are a major cause of broken bones and head injuries in people who are older than age 70. Take precautions to prevent a fall at home.  Work with your health care provider to learn what changes you can make to improve your health and wellness and to prevent falls. This information is not intended to replace advice given to you by your  health care provider. Make sure you discuss any questions you have with your health care provider. Document Revised: 08/10/2018 Document Reviewed: 03/02/2017 Elsevier Patient Education  2021 [employer].

## 2020-06-23 NOTE — Progress Notes (Signed)
Patient ID: Lori Caldwell, female    DOB: [birth year]  Age: 80 y.o. MRN: [id number]  The patient is here for follow up and  management of other chronic and acute problems.   The risk factors are reflected in the social history.  The roster of all physicians providing medical care to patient - is listed in the Snapshot section of the chart.  Activities of daily living:  The patient is 100% independent in all ADLs: dressing, toileting, feeding as well as independent mobility  Home safety : The patient has smoke detectors in the home. They wear seatbelts.  There are no firearms at home. There is no violence in the home.   There is no risks for hepatitis, STDs or HIV. There is no   history of blood transfusion. They have no travel history to infectious disease endemic areas of the world.  The patient has seen their dentist in the last six month. They have seen their eye doctor in the last year. She wears hearing aids in both ears.  she does not   have excessive sun exposure. Discussed the need for sun protection: hats, long sleeves and use of sunscreen if there is significant sun exposure.   Diet: the importance of a healthy diet is discussed. They do have a healthy diet.  The benefits of regular aerobic exercise were discussed. She walks 4 times per week ,  20 minutes.   Depression screen: there are no signs or vegative symptoms of depression- irritability, change in appetite, anhedonia, sadness/tearfullness.  Cognitive assessment: the patient manages all their financial and personal affairs and is actively engaged. They could relate day,date,year and events; recalled 2/3 objects at 3 minutes; performed clock-face test normally.  The following portions of the patient's history were reviewed and updated as appropriate: allergies, current medications, past family history, past medical history,  past surgical history, past social history  and problem list.  Visual acuity was not assessed per  patient preference since she has regular follow up with her ophthalmologist. Hearing and body mass index were assessed and reviewed.   During the course of the visit the patient was educated and counseled about appropriate screening and preventive services including : fall prevention , diabetes screening, nutrition counseling, colorectal cancer screening, and recommended immunizations.    CC: The primary encounter diagnosis was Mixed hyperlipidemia. Diagnoses of S/P right mastectomy, Gastroesophageal reflux disease without esophagitis, Personal history of breast cancer, Osteopenia due to cancer therapy, and Basal cell carcinoma of chin were also pertinent to this visit.   1) joint pain :  Right  hip and right knee aggravated by [employer] aerobics class.  Mild,  Tolerable.  Has recovered from Right foot capsulitis treated by podiatry .   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  Never got sick  3) sees dermatology at [facility] 2/year  For basal cell CA on chin  excised via Moh's procedure Dr Lacinda Axon   History Lori Caldwell has a past medical history of Breast cancer ([place]) (2000), GERD (gastroesophageal reflux disease), Hyperlipidemia, Personal history of chemotherapy (2000), and Personal history of radiation therapy (2000).   She has a past surgical history that includes Breast surgery and Mastectomy (Right, 2000).   Her family history includes Cancer in her maternal grandmother; Heart disease in her father; Hypertension in her mother.She reports that she has never smoked. She has never used smokeless tobacco. She reports current alcohol use. She reports that she does not use drugs.  Outpatient  Medications Prior to Visit  Medication Sig Dispense Refill  . aspirin 81 MG tablet Take 81 mg by mouth daily.    . Azelastine HCl 0.15 % SOLN Place 2 sprays into the nose daily as needed. 30 mL 2  . hydrocortisone 2.5 % cream Apply topically 2 (two) times daily. 30 g 0  . Multiple Vitamin  (MULTIVITAMIN) tablet Take 1 tablet by mouth daily.    . simvastatin (ZOCOR) 40 MG tablet Take one tablet by mouth every evening 90 tablet 0  . meloxicam (MOBIC) 15 MG tablet Take 1 tablet (15 mg total) by mouth daily. (Patient not taking: Reported on 06/23/2020) 30 tablet 1   No facility-administered medications prior to visit.    Review of Systems   Patient denies headache, fevers, malaise, unintentional weight loss, skin rash, eye pain, sinus congestion and sinus pain, sore throat, dysphagia,  hemoptysis , cough, dyspnea, wheezing, chest pain, palpitations, orthopnea, edema, abdominal pain, nausea, melena, diarrhea, constipation, flank pain, dysuria, hematuria, urinary  Frequency, nocturia, numbness, tingling, seizures,  Focal weakness, Loss of consciousness,  Tremor, insomnia, depression, anxiety, and suicidal ideation.      Objective:  BP 132/68 (BP Location: Left Arm, Patient Position: Sitting, Cuff Size: Normal)   Pulse 77   Temp (!) 97.5 F (36.4 C) (Oral)   Resp 15   Ht 5' 7.5" (1.715 m)   Wt 164 lb 6.4 oz (74.6 kg)   SpO2 99%   BMI 25.37 kg/m   Physical Exam  General appearance: alert, cooperative and appears stated age Head: Normocephalic, without obvious abnormality, atraumatic Eyes: conjunctivae/corneas clear. PERRL, EOM's intact. Fundi benign. Ears: normal TM's and external ear canals both ears Nose: Nares normal. Septum midline. Mucosa normal. No drainage or sinus tenderness. Throat: lips, mucosa, and tongue normal; teeth and gums normal Neck: no adenopathy, no carotid bruit, no JVD, supple, symmetrical, trachea midline and thyroid not enlarged, symmetric, no tenderness/mass/nodules Lungs: clear to auscultation bilaterally Breasts: right mastectomy , normal left breast ,  no masses or tenderness Heart: regular rate and rhythm, S1, S2 normal, no murmur, click, rub or gallop Abdomen: soft, non-tender; bowel sounds normal; no masses,  no organomegaly Extremities:  extremities normal, atraumatic, no cyanosis or edema Pulses: 2+ and symmetric Skin: Skin color, texture, turgor normal. No rashes or lesions Neurologic: Alert and oriented X 3, normal strength and tone. Normal symmetric reflexes. Normal coordination and gait.    Assessment & Plan:   Problem List Items Addressed This Visit      Unprioritized   S/P right mastectomy   Personal history of breast cancer    Left breast exam normal. Continue annual screening mammograms      Osteopenia due to cancer therapy    Reviewed results of DEXA done in  Sept  2021.  She has mild to moderate osteopenia by recent DEXA  With slight progression at the femur. .  I recommend continued daily participation in weight bearing exercise activities , striving to get  the majority of your calcium needs (1200 mg )   through dietary sources rather than supplements ,  and taking a vitamin D supplement (2000 Ius daily for the winter months unless otherwise directed ) . Repeat DEXA in 2 years.           Hyperlipidemia - Primary    Managed with zocor.  LDL and triglycerides were  at goal on current medication and repeat labs are pending . She has no side effects currently  Relevant Orders   Comprehensive metabolic panel   Lipid panel   TSH   GERD (gastroesophageal reflux disease)    No longer taking nexium.   GERD: she is avoiding foods and behaviors that trigger her reflux symptoms.  She denies any recent episodes of chest pain        Basal cell carcinoma of chin    Removed by Dr Lacinda Axon with Moh's procedure. Continue use of wide brimmed hats  sunscreen        A total of 30 minutes was spent with patient more than half of which was spent in counseling patient on the above mentioned issues , reviewing and explaining recent labs and imaging studies done, and coordination of care. I have discontinued [name]'s meloxicam. I am also having her maintain her aspirin, multivitamin, Azelastine HCl,  hydrocortisone, and simvastatin.  No orders of the defined types were placed in this encounter.   Medications Discontinued During This Encounter  Medication Reason  . meloxicam (MOBIC) 15 MG tablet     Follow-up: No follow-ups on file.   Crecencio Mc, MD

## 2020-06-24 ENCOUNTER — Other Ambulatory Visit (INDEPENDENT_AMBULATORY_CARE_PROVIDER_SITE_OTHER): Payer: Medicare Other

## 2020-06-24 DIAGNOSIS — C44319 Basal cell carcinoma of skin of other parts of face: Secondary | ICD-10-CM | POA: Insufficient documentation

## 2020-06-24 DIAGNOSIS — E782 Mixed hyperlipidemia: Secondary | ICD-10-CM | POA: Diagnosis not present

## 2020-06-24 LAB — COMPREHENSIVE METABOLIC PANEL
ALT: 18 U/L (ref 0–35)
AST: 22 U/L (ref 0–37)
Albumin: 4.3 g/dL (ref 3.5–5.2)
Alkaline Phosphatase: 52 U/L (ref 39–117)
BUN: 17 mg/dL (ref 6–23)
CO2: 29 mEq/L (ref 19–32)
Calcium: 9.7 mg/dL (ref 8.4–10.5)
Chloride: 102 mEq/L (ref 96–112)
Creatinine, Ser: 0.83 mg/dL (ref 0.40–1.20)
GFR: 67.4 mL/min (ref >60.00)
Glucose, Bld: 86 mg/dL (ref 70–99)
Potassium: 4.3 mEq/L (ref 3.5–5.1)
Sodium: 140 mEq/L (ref 135–145)
Total Bilirubin: 0.7 mg/dL (ref 0.2–1.2)
Total Protein: 7 g/dL (ref 6.0–8.3)

## 2020-06-24 LAB — LIPID PANEL
Cholesterol: 167 mg/dL (ref 0–200)
HDL: 71.6 mg/dL (ref >39.00)
LDL Cholesterol: 70 mg/dL (ref 0–99)
NonHDL: 95.29
Total CHOL/HDL Ratio: 2
Triglycerides: 124 mg/dL (ref 0.0–149.0)
VLDL: 24.8 mg/dL (ref 0.0–40.0)

## 2020-06-24 LAB — TSH: TSH: 3.01 u[IU]/mL (ref 0.35–4.50)

## 2020-06-24 NOTE — Assessment & Plan Note (Signed)
Managed with zocor.  LDL and triglycerides were  at goal on current medication and repeat labs are pending . She has no side effects currently

## 2020-06-24 NOTE — Assessment & Plan Note (Signed)
Left breast exam normal. Continue annual screening mammograms

## 2020-06-24 NOTE — Assessment & Plan Note (Signed)
No longer taking nexium.   GERD: she is avoiding foods and behaviors that trigger her reflux symptoms.  She denies any recent episodes of chest pain

## 2020-06-24 NOTE — Assessment & Plan Note (Signed)
Removed by Dr Lacinda Axon with Moh's procedure. Continue use of wide brimmed hats  sunscreen

## 2020-06-24 NOTE — Assessment & Plan Note (Addendum)
Reviewed results of DEXA done in  Sept  2021.  She has mild to moderate osteopenia by recent DEXA  With slight progression at the femur. .  I recommend continued daily participation in weight bearing exercise activities , striving to get  the majority of your calcium needs (1200 mg )   through dietary sources rather than supplements ,  and taking a vitamin D supplement (2000 Ius daily for the winter months unless otherwise directed ) . Repeat DEXA in 2 years.

## 2020-06-27 ENCOUNTER — Other Ambulatory Visit: Payer: Self-pay

## 2020-06-27 ENCOUNTER — Ambulatory Visit
Admission: RE | Admit: 2020-06-27 | Discharge: 2020-06-27 | Disposition: A | Discharge status: Home / Self Care | Payer: Medicare Other | Source: Ambulatory Visit | Attending: Internal Medicine | Admitting: Internal Medicine

## 2020-06-27 DIAGNOSIS — Z1231 Encounter for screening mammogram for malignant neoplasm of breast: Secondary | ICD-10-CM | POA: Insufficient documentation

## 2020-06-30 ENCOUNTER — Other Ambulatory Visit: Payer: Self-pay | Admitting: Internal Medicine

## 2020-07-01 ENCOUNTER — Telehealth: Payer: Self-pay | Admitting: Internal Medicine

## 2020-07-01 NOTE — Telephone Encounter (Signed)
Left message for patient to call back and schedule Medicare Annual Wellness Visit (AWV)   This should be a virtual visit only=30 minutes.  Last AWV 05/18/19; please schedule at anytime with Denisa O'Brien-Blaney at [facility]

## 2020-07-24 DIAGNOSIS — L821 Other seborrheic keratosis: Secondary | ICD-10-CM | POA: Diagnosis not present

## 2020-07-24 DIAGNOSIS — C44519 Basal cell carcinoma of skin of other part of trunk: Secondary | ICD-10-CM | POA: Diagnosis not present

## 2020-07-24 DIAGNOSIS — D225 Melanocytic nevi of trunk: Secondary | ICD-10-CM | POA: Diagnosis not present

## 2020-07-24 DIAGNOSIS — D485 Neoplasm of uncertain behavior of skin: Secondary | ICD-10-CM | POA: Diagnosis not present

## 2020-07-24 DIAGNOSIS — Z85828 Personal history of other malignant neoplasm of skin: Secondary | ICD-10-CM | POA: Diagnosis not present

## 2020-08-26 DIAGNOSIS — C44519 Basal cell carcinoma of skin of other part of trunk: Secondary | ICD-10-CM | POA: Diagnosis not present

## 2020-09-17 ENCOUNTER — Ambulatory Visit (INDEPENDENT_AMBULATORY_CARE_PROVIDER_SITE_OTHER): Payer: Medicare Other

## 2020-09-17 ENCOUNTER — Telehealth: Payer: Self-pay

## 2020-09-17 VITALS — Ht 67.5 in | Wt 164.0 lb

## 2020-09-17 DIAGNOSIS — Z0000 Encounter for general adult medical examination without abnormal findings: Secondary | ICD-10-CM | POA: Diagnosis not present

## 2020-09-17 NOTE — Patient Instructions (Addendum)
[name] , Thank you for taking time to come for your Medicare Wellness Visit. I appreciate your ongoing commitment to your health goals. Please review the following plan we discussed and let me know if I can assist you in the future.   These are the goals we discussed: Goals    . Follow up with Primary Care Provider     As needed       This is a list of the screening recommended for you and due dates:  Health Maintenance  Topic Date Due  . COVID-19 Vaccine (4 - Booster for Pfizer series) 07/02/2021*  . Flu Shot  12/01/2020  . Tetanus Vaccine  11/20/2021  . DEXA scan (bone density measurement)  Completed  . Hepatitis C Screening: USPSTF Recommendation to screen - Ages 35-79 yo.  Completed  . Pneumonia vaccines  Completed  . HPV Vaccine  Aged Out  *Topic was postponed. The date shown is not the original due date.   Advanced directives: End of life planning; Advance aging; Advanced directives discussed.  Copy of current HCPOA/Living Will requested.    Conditions/risks identified: none new  Next appointment: Follow up in one year for your annual wellness visit    Preventive Care 65 Years and Older, Female Preventive care refers to lifestyle choices and visits with your health care provider that can promote health and wellness. What does preventive care include?  A yearly physical exam. This is also called an annual well check.  Dental exams once or twice a year.  Routine eye exams. Ask your health care provider how often you should have your eyes checked.  Personal lifestyle choices, including:  Daily care of your teeth and gums.  Regular physical activity.  Eating a healthy diet.  Avoiding tobacco and drug use.  Limiting alcohol use.  Practicing safe sex.  Taking low-dose aspirin every day.  Taking vitamin and mineral supplements as recommended by your health care provider. What happens during an annual well check? The services and screenings done by your  health care provider during your annual well check will depend on your age, overall health, lifestyle risk factors, and family history of disease. Counseling  Your health care provider may ask you questions about your:  Alcohol use.  Tobacco use.  Drug use.  Emotional well-being.  Home and relationship well-being.  Sexual activity.  Eating habits.  History of falls.  Memory and ability to understand (cognition).  Work and work Statistician.  Reproductive health. Screening  You may have the following tests or measurements:  Height, weight, and BMI.  Blood pressure.  Lipid and cholesterol levels. These may be checked every 5 years, or more frequently if you are over 56 years old.  Skin check.  Lung cancer screening. You may have this screening every year starting at age 11 if you have a 30-pack-year history of smoking and currently smoke or have quit within the past 15 years.  Fecal occult blood test (FOBT) of the stool. You may have this test every year starting at age 79.  Flexible sigmoidoscopy or colonoscopy. You may have a sigmoidoscopy every 5 years or a colonoscopy every 10 years starting at age 45.  Hepatitis C blood test.  Hepatitis B blood test.  Sexually transmitted disease (STD) testing.  Diabetes screening. This is done by checking your blood sugar (glucose) after you have not eaten for a while (fasting). You may have this done every 1-3 years.  Bone density scan. This is done to screen  for osteoporosis. You may have this done starting at age 20.  Mammogram. This may be done every 1-2 years. Talk to your health care provider about how often you should have regular mammograms. Talk with your health care provider about your test results, treatment options, and if necessary, the need for more tests. Vaccines  Your health care provider may recommend certain vaccines, such as:  Influenza vaccine. This is recommended every year.  Tetanus, diphtheria, and  acellular pertussis (Tdap, Td) vaccine. You may need a Td booster every 10 years.  Zoster vaccine. You may need this after age 86.  Pneumococcal 13-valent conjugate (PCV13) vaccine. One dose is recommended after age 39.  Pneumococcal polysaccharide (PPSV23) vaccine. One dose is recommended after age 76. Talk to your health care provider about which screenings and vaccines you need and how often you need them. This information is not intended to replace advice given to you by your health care provider. Make sure you discuss any questions you have with your health care provider. Document Released: 05/16/2015 Document Revised: 01/07/2016 Document Reviewed: 02/18/2015 Elsevier Interactive Patient Education  2017 [place] Prevention in the Home Falls can cause injuries. They can happen to people of all ages. There are many things you can do to make your home safe and to help prevent falls. What can I do on the outside of my home?  Regularly fix the edges of walkways and driveways and fix any cracks.  Remove anything that might make you trip as you walk through a door, such as a raised step or threshold.  Trim any bushes or trees on the path to your home.  Use bright outdoor lighting.  Clear any walking paths of anything that might make someone trip, such as rocks or tools.  Regularly check to see if handrails are loose or broken. Make sure that both sides of any steps have handrails.  Any raised decks and porches should have guardrails on the edges.  Have any leaves, snow, or ice cleared regularly.  Use sand or salt on walking paths during winter.  Clean up any spills in your garage right away. This includes oil or grease spills. What can I do in the bathroom?  Use night lights.  Install grab bars by the toilet and in the tub and shower. Do not use towel bars as grab bars.  Use non-skid mats or decals in the tub or shower.  If you need to sit down in the shower, use  a plastic, non-slip stool.  Keep the floor dry. Clean up any water that spills on the floor as soon as it happens.  Remove soap buildup in the tub or shower regularly.  Attach bath mats securely with double-sided non-slip rug tape.  Do not have throw rugs and other things on the floor that can make you trip. What can I do in the bedroom?  Use night lights.  Make sure that you have a light by your bed that is easy to reach.  Do not use any sheets or blankets that are too big for your bed. They should not hang down onto the floor.  Have a firm chair that has side arms. You can use this for support while you get dressed.  Do not have throw rugs and other things on the floor that can make you trip. What can I do in the kitchen?  Clean up any spills right away.  Avoid walking on wet floors.  Keep items that you  use a lot in easy-to-reach places.  If you need to reach something above you, use a strong step stool that has a grab bar.  Keep electrical cords out of the way.  Do not use floor polish or wax that makes floors slippery. If you must use wax, use non-skid floor wax.  Do not have throw rugs and other things on the floor that can make you trip. What can I do with my stairs?  Do not leave any items on the stairs.  Make sure that there are handrails on both sides of the stairs and use them. Fix handrails that are broken or loose. Make sure that handrails are as long as the stairways.  Check any carpeting to make sure that it is firmly attached to the stairs. Fix any carpet that is loose or worn.  Avoid having throw rugs at the top or bottom of the stairs. If you do have throw rugs, attach them to the floor with carpet tape.  Make sure that you have a light switch at the top of the stairs and the bottom of the stairs. If you do not have them, ask someone to add them for you. What else can I do to help prevent falls?  Wear shoes that:  Do not have high heels.  Have  rubber bottoms.  Are comfortable and fit you well.  Are closed at the toe. Do not wear sandals.  If you use a stepladder:  Make sure that it is fully opened. Do not climb a closed stepladder.  Make sure that both sides of the stepladder are locked into place.  Ask someone to hold it for you, if possible.  Clearly mark and make sure that you can see:  Any grab bars or handrails.  First and last steps.  Where the edge of each step is.  Use tools that help you move around (mobility aids) if they are needed. These include:  Canes.  Walkers.  Scooters.  Crutches.  Turn on the lights when you go into a dark area. Replace any light bulbs as soon as they burn out.  Set up your furniture so you have a clear path. Avoid moving your furniture around.  If any of your floors are uneven, fix them.  If there are any pets around you, be aware of where they are.  Review your medicines with your doctor. Some medicines can make you feel dizzy. This can increase your chance of falling. Ask your doctor what other things that you can do to help prevent falls. This information is not intended to replace advice given to you by your health care provider. Make sure you discuss any questions you have with your health care provider. Document Released: 02/13/2009 Document Revised: 09/25/2015 Document Reviewed: 05/24/2014 Elsevier Interactive Patient Education  2017 [employer].

## 2020-09-17 NOTE — Progress Notes (Addendum)
Subjective:   Lori Caldwell is a 80 y.o. female who presents for Medicare Annual (Subsequent) preventive examination.  Review of Systems    No ROS.  Medicare Wellness Virtual Visit.  Visual/audio telehealth visit, UTA vital signs.   See social history for additional risk factors.   Cardiac Risk Factors include: advanced age (>23men, >47 women)     Objective:    Today's Vitals   09/17/20 1544  Weight: 164 lb (74.4 kg)  Height: 5' 7.5" (1.715 m)   Body mass index is 25.31 kg/m.  Advanced Directives 09/17/2020 05/18/2019 05/31/2017 09/06/2016  Does Patient Have a Medical Advance Directive? Yes Yes Yes No  Type of Paramedic of [place];Living will [place];Living will [place];Living will -  Does patient want to make changes to medical advance directive? No - Patient declined No - Patient declined No - Patient declined -  Copy of [place] in Chart? Yes - validated most recent copy scanned in chart (See row information) Yes - validated most recent copy scanned in chart (See row information) No - copy requested -    Current Medications (verified) Outpatient Encounter Medications as of 09/17/2020  Medication Sig   aspirin 81 MG tablet Take 81 mg by mouth daily.   Azelastine HCl 0.15 % SOLN Place 2 sprays into the nose daily as needed.   hydrocortisone 2.5 % cream Apply topically 2 (two) times daily.   Multiple Vitamin (MULTIVITAMIN) tablet Take 1 tablet by mouth daily.   simvastatin (ZOCOR) 40 MG tablet Take one tablet by mouth every evening   No facility-administered encounter medications on file as of 09/17/2020.    Allergies (verified) Patient has no known allergies.   History: Past Medical History:  Diagnosis Date   Breast cancer ([facility]) 2000   RT MASTECTOMY   GERD (gastroesophageal reflux disease)    Hyperlipidemia    Personal history of chemotherapy 2000   BREAST CA   Personal  history of radiation therapy 2000   BREAST CA   Past Surgical History:  Procedure Laterality Date   BREAST SURGERY     mastectomy, left breast    MASTECTOMY Right 2000   BREAST CA   Family History  Problem Relation Age of Onset   Hypertension Mother    Heart disease Father    Cancer Maternal Grandmother    Breast cancer Neg Hx    Social History   Socioeconomic History   Marital status: Married    Spouse name: Not on file   Number of children: Not on file   Years of education: Not on file   Highest education level: Not on file  Occupational History   Not on file  Tobacco Use   Smoking status: Never Smoker   Smokeless tobacco: Never Used  Vaping Use   Vaping Use: Never used  Substance and Sexual Activity   Alcohol use: Yes    Comment: OCC   Drug use: No   Sexual activity: Not on file  Other Topics Concern   Not on file  Social History Narrative   Married    Oceanographer    Social Determinants of Health   Financial Resource Strain: Low Risk    Difficulty of Paying Living Expenses: Not hard at all  Food Insecurity: No Food Insecurity   Worried About Charity fundraiser in the Last Year: Never true   [place] in the Last Year: Never true  Transportation Needs: No Data processing manager (Medical): No   Lack of Transportation (Non-Medical): No  Physical Activity: Insufficiently Active   Days of Exercise per Week: 3 days   Minutes of Exercise per Session: 30 min  Stress: No Stress Concern Present   Feeling of Stress : Not at all  Social Connections: Socially Integrated   Frequency of Communication with Friends and Family: More than three times a week   Frequency of Social Gatherings with Friends and Family: More than three times a week   Attends Religious Services: 1 to 4 times per year   Active Member of [employer] or Organizations: Yes   Attends Archivist Meetings: 1 to 4 times per year   Marital Status: Married     Tobacco Counseling Counseling given: Not Answered   Clinical Intake:  Pre-visit preparation completed: Yes        Diabetes: No  How often do you need to have someone help you when you read instructions, pamphlets, or other written materials from your doctor or pharmacy?: 1 - Never    Interpreter Needed?: No      Activities of Daily Living In your present state of health, do you have any difficulty performing the following activities: 09/17/2020  Hearing? Y  Comment Hearing aids  Vision? N  Difficulty concentrating or making decisions? N  Walking or climbing stairs? N  Dressing or bathing? N  Doing errands, shopping? N  Preparing Food and eating ? N  Using the Toilet? N  In the past six months, have you accidently leaked urine? N  Do you have problems with loss of bowel control? N  Managing your Medications? N  Managing your Finances? N  Housekeeping or managing your Housekeeping? N  Some recent data might be hidden    Patient Care Team: Crecencio Mc, MD as PCP - General (Internal Medicine)  Indicate any recent Medical Services you may have received from other than [facility] providers in the past year (date may be approximate).     Assessment:   This is a routine wellness examination for [facility].  I connected with Angie today by telephone and verified that I am speaking with the correct person using two identifiers. Location patient: home Location provider: work Persons participating in the virtual visit: patient, Marine scientist.    I discussed the limitations, risks, security and privacy concerns of performing an evaluation and management service by telephone and the availability of in person appointments. The patient expressed understanding and verbally consented to this telephonic visit.    Interactive audio and video telecommunications were attempted between this provider and patient, however failed, due to patient having technical difficulties OR patient  did not have access to video capability.  We continued and completed visit with audio only.  Some vital signs may be absent or patient reported.   Hearing/Vision screen  Hearing Screening   125Hz  250Hz  500Hz  1000Hz  2000Hz  3000Hz  4000Hz  6000Hz  8000Hz   Right ear:           Left ear:           Comments: Hearing aids  Vision Screening Comments: Followed by Kathyrn Lass  Wears corrective lenses  Visual acuity not assessed per patient preference since they have regular follow up with the ophthalmologist  Dietary issues and exercise activities discussed: Current Exercise Habits: Home exercise routine, Time (Minutes): 30, Frequency (Times/Week): 5, Weekly Exercise (Minutes/Week): 150, Intensity: Mild  Goals Addressed  This Visit's Progress    Follow up with Primary Care Provider       As needed       Depression Screen PHQ 2/9 Scores 09/17/2020 06/23/2020 05/18/2019 05/18/2019 05/31/2017 11/01/2016 01/16/2015  PHQ - 2 Score 0 0 0 0 0 0 0  PHQ- 9 Score - - - - 0 - -    Fall Risk Fall Risk  09/17/2020 06/23/2020 06/20/2019 05/18/2019 03/28/2019  Falls in the past year? 0 0 0 0 0  Comment - - - - Emmi Telephone Survey: data to providers prior to load  Number falls in past yr: 0 - - - -  Comment - - - - -  Injury with Fall? 0 - - - -  Follow up Falls evaluation completed Falls evaluation completed Falls evaluation completed Falls evaluation completed -    FALL RISK PREVENTION PERTAINING TO THE HOME: Handrails in use when climbing stairs? Yes Home free of loose throw rugs in walkways, pet beds, electrical cords, etc? Yes  Adequate lighting in your home to reduce risk of falls? Yes   ASSISTIVE DEVICES UTILIZED TO PREVENT FALLS: Life alert? No  Use of a cane, walker or w/c? No   TIMED UP AND GO: Was the test performed? No . Virtual visit.   Cognitive Function:  Patient is alert and oriented x3.  Denies difficulty focusing, making decisions, memory loss. 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 Normal by direct communication/observation.  MMSE - Mini Mental State Exam 05/31/2017  Orientation to time 5  Orientation to Place 5  Registration 3  Attention/ Calculation 5  Recall 3  Language- name 2 objects 2  Language- repeat 1  Language- follow 3 step command 3  Language- read & follow direction 1  Write a sentence 1  Copy design 1  Total score 30     6CIT Screen 05/18/2019  What Year? 0 points  What month? 0 points  What time? 0 points  Count back from 20 0 points  Months in reverse 0 points  Repeat phrase 0 points  Total Score 0    Immunizations Immunization History  Administered Date(s) Administered   Influenza Split 02/19/2011   Influenza,inj,Quad PF,6+ Mos 01/15/2015   Influenza-Unspecified 03/03/2013, 04/02/2014, 02/17/2016, 02/07/2017, 03/09/2019, 03/03/2020   PFIZER(Purple Top)SARS-COV-2 Vaccination 05/09/2019, 05/30/2019, 02/06/2020   Pneumococcal Conjugate-13 12/28/2013   Pneumococcal Polysaccharide-23 12/22/2007, 12/21/2012   Tdap 11/21/2011   Zoster 11/20/2012   Zoster Recombinat (Shingrix) 08/24/2017, 10/24/2017    Health Maintenance Health Maintenance  Topic Date Due   COVID-19 Vaccine (4 - Booster for Lluveras series) 07/02/2021 (Originally 05/08/2020)   INFLUENZA VACCINE  12/01/2020   TETANUS/TDAP  11/20/2021   DEXA SCAN  Completed   Hepatitis C Screening  Completed   PNA vac Low Risk Adult  Completed   HPV VACCINES  Aged Out   Colorectal cancer screening: No longer required.   Mammogram status: Completed 06/28/19. Repeat every year. Ordered.   Lung Cancer Screening: (Low Dose CT Chest recommended if Age 40-80 years, 30 pack-year currently smoking OR have quit w/in 15years.) does not qualify.   Vision Screening: Recommended annual ophthalmology exams for early detection of glaucoma and other disorders of the eye. Is the patient up to date with their annual eye exam?  Yes   Dental Screening: Recommended annual  dental exams for proper oral hygiene.  Community Resource Referral / Chronic Care Management: CRR required this visit?  No   CCM required this visit?  No  Plan:   Keep all routine maintenance appointments.   Cpe 07/02/21 @ 9:00   I have personally reviewed and noted the following in the patient's chart:   Medical and social history Use of alcohol, tobacco or illicit drugs  Current medications and supplements including opioid prescriptions. Patient is not currently taking opioids.  Functional ability and status Nutritional status Physical activity Advanced directives List of other physicians Hospitalizations, surgeries, and ER visits in previous 12 months Vitals Screenings to include cognitive, depression, and falls Referrals and appointments  In addition, I have reviewed and discussed with patient certain preventive protocols, quality metrics, and best practice recommendations. A written personalized care plan for preventive services as well as general preventive health recommendations were provided to patient via mychart.     OBrien-Blaney, Hayven Croy L, LPN   1/69/6789     I have reviewed the above information and agree with above.   Deborra Medina, MD

## 2020-09-17 NOTE — Telephone Encounter (Signed)
Called patient on preferred number for scheduled awv. Patient answers and requests call back on her cell phone so she can hear better. Nurse agrees and calls number provided x2. No answer. Unable to leave voice mail. Reschedule appointment okay.

## 2020-09-19 DIAGNOSIS — H903 Sensorineural hearing loss, bilateral: Secondary | ICD-10-CM | POA: Diagnosis not present

## 2020-09-19 DIAGNOSIS — H6123 Impacted cerumen, bilateral: Secondary | ICD-10-CM | POA: Diagnosis not present

## 2020-09-24 DIAGNOSIS — H903 Sensorineural hearing loss, bilateral: Secondary | ICD-10-CM | POA: Diagnosis not present

## 2020-09-30 ENCOUNTER — Other Ambulatory Visit: Payer: Self-pay | Admitting: Internal Medicine

## 2020-10-15 DIAGNOSIS — H26493 Other secondary cataract, bilateral: Secondary | ICD-10-CM | POA: Diagnosis not present

## 2020-11-05 DIAGNOSIS — Z961 Presence of intraocular lens: Secondary | ICD-10-CM | POA: Diagnosis not present

## 2020-11-19 ENCOUNTER — Other Ambulatory Visit: Payer: Self-pay

## 2020-11-19 ENCOUNTER — Encounter: Payer: Self-pay | Admitting: Internal Medicine

## 2020-11-19 ENCOUNTER — Ambulatory Visit (INDEPENDENT_AMBULATORY_CARE_PROVIDER_SITE_OTHER): Payer: Medicare Other | Admitting: Internal Medicine

## 2020-11-19 VITALS — BP 142/80 | HR 85 | Temp 98.2°F | Ht 67.5 in | Wt 167.8 lb

## 2020-11-19 DIAGNOSIS — K5900 Constipation, unspecified: Secondary | ICD-10-CM | POA: Diagnosis not present

## 2020-11-19 NOTE — Progress Notes (Signed)
Subjective:  Patient ID: Lori Caldwell, female    DOB: [birth year]  Age: 80 y.o. MRN: [id number]  CC: The encounter diagnosis was Constipation, unspecified constipation type.  HPI Lori Caldwell presents for management of new onset constipation    This visit occurred during the SARS-CoV-2 public health emergency.  Safety protocols were in place, including screening questions prior to the visit, additional usage of staff PPE, and extensive cleaning of exam room while observing appropriate contact time as indicated for disinfecting solutions.   Symptoms first started in February and were originally sporadic. However she has become more persistently symptomatic  for the last 2 mon ths.  Started taking metamucil , then added  Bulk forming laxative pills,  but after 2-3 days without BM took dulcolax .  Spent most of Monday night having recurrent stools which progressed from formed to loose.  After spending a night in the bathroom,  she has returned to having small formed hard stools despite maintaining adequate hydration .   Last colonoscopy was done in 2008  Outpatient Medications Prior to Visit  Medication Sig Dispense Refill   aspirin 81 MG tablet Take 81 mg by mouth daily.     Azelastine HCl 0.15 % SOLN Place 2 sprays into the nose daily as needed. 30 mL 2   hydrocortisone 2.5 % cream Apply topically 2 (two) times daily. 30 g 0   Multiple Vitamin (MULTIVITAMIN) tablet Take 1 tablet by mouth daily.     Psyllium (METAMUCIL PO) Take 2 tablets by mouth 2 (two) times daily.     simvastatin (ZOCOR) 40 MG tablet Take one tablet by mouth every evening 90 tablet 0   No facility-administered medications prior to visit.    Review of Systems;  Patient denies headache, fevers, malaise, unintentional weight loss, skin rash, eye pain, sinus congestion and sinus pain, sore throat, dysphagia,  hemoptysis , cough, dyspnea, wheezing, chest pain, palpitations, orthopnea, edema, abdominal pain, nausea,  melena, diarrhea, constipation, flank pain, dysuria, hematuria, urinary  Frequency, nocturia, numbness, tingling, seizures,  Focal weakness, Loss of consciousness,  Tremor, insomnia, depression, anxiety, and suicidal ideation.      Objective:  BP (!) 142/80 (BP Location: Left Arm, Patient Position: Sitting, Cuff Size: Large)   Pulse 85   Temp 98.2 F (36.8 C) (Oral)   Ht 5' 7.5" (1.715 m)   Wt 167 lb 12.8 oz (76.1 kg)   SpO2 98%   BMI 25.89 kg/m   BP Readings from Last 3 Encounters:  11/19/20 (!) 142/80  06/23/20 132/68  06/20/19 122/74    Wt Readings from Last 3 Encounters:  11/19/20 167 lb 12.8 oz (76.1 kg)  09/17/20 164 lb (74.4 kg)  06/23/20 164 lb 6.4 oz (74.6 kg)    General appearance: alert, cooperative and appears stated age Ears: normal TM's and external ear canals both ears Throat: lips, mucosa, and tongue normal; teeth and gums normal Neck: no adenopathy, no carotid bruit, supple, symmetrical, trachea midline and thyroid not enlarged, symmetric, no tenderness/mass/nodules Back: symmetric, no curvature. ROM normal. No CVA tenderness. Lungs: clear to auscultation bilaterally Heart: regular rate and rhythm, S1, S2 normal, no murmur, click, rub or gallop Abdomen: soft, non-tender; bowel sounds normal; no masses,  no organomegaly Pulses: 2+ and symmetric Skin: Skin color, texture, turgor normal. No rashes or lesions Lymph nodes: Cervical, supraclavicular, and axillary nodes normal.  No results found for: HGBA1C  Lab Results  Component Value Date   CREATININE 0.83 06/24/2020   CREATININE  0.87 06/18/2019   CREATININE 0.77 04/10/2018    Lab Results  Component Value Date   WBC 6.4 06/18/2019   HGB 14.4 06/18/2019   HCT 42.4 06/18/2019   PLT 187.0 06/18/2019   GLUCOSE 86 06/24/2020   CHOL 167 06/24/2020   TRIG 124.0 06/24/2020   HDL 71.60 06/24/2020   LDLDIRECT 117.8 06/25/2013   LDLCALC 70 06/24/2020   ALT 18 06/24/2020   AST 22 06/24/2020   NA 140  06/24/2020   K 4.3 06/24/2020   CL 102 06/24/2020   CREATININE 0.83 06/24/2020   BUN 17 06/24/2020   CO2 29 06/24/2020   TSH 3.01 06/24/2020    MM 3D SCREEN BREAST UNI LEFT  Result Date: 06/30/2020 CLINICAL DATA:  Screening. EXAM: DIGITAL SCREENING UNILATERAL LEFT MAMMOGRAM WITH CAD AND TOMOSYNTHESIS TECHNIQUE: Left screening digital craniocaudal and mediolateral oblique mammograms were obtained. Left screening digital breast tomosynthesis was performed. The images were evaluated with computer-aided detection. COMPARISON:  Previous exam(s). ACR Breast Density Category c: The breast tissue is heterogeneously dense, which may obscure small masses. FINDINGS: There are no findings suspicious for malignancy. The images were evaluated with computer-aided detection. IMPRESSION: No mammographic evidence of malignancy. A result letter of this screening mammogram will be mailed directly to the patient. RECOMMENDATION: Screening mammogram in one year. (Code:SM-B-01Y) BI-RADS CATEGORY  1: Negative. Electronically Signed   By: Lovey Newcomer M.D.   On: 06/30/2020 17:10    Assessment & Plan:   Problem List Items Addressed This Visit       Unprioritized   Constipation - Primary    New onset ,  For several months despite use of bulk forming laxatoves. Did not improve with addition of stool softener and glycerin supppository.  Will prescribe go lytely.   Change in bowel function warrants referral for diagnostic colonoscopy to rule out mass       Relevant Orders   Ambulatory referral to Gastroenterology    I provided  30 minutes of  face-to-face time during this encounter reviewing patient's current change in bowel habits,, past surgeries, labs and imaging studies, providing counseling on the above mentioned problems , and coordination  of care .  I am having Lori Caldwell "Lori Caldwell" start on polyethylene glycol. I am also having her maintain her aspirin, multivitamin, Azelastine HCl, hydrocortisone,  simvastatin, and Psyllium (METAMUCIL PO).  Meds ordered this encounter  Medications   polyethylene glycol (GOLYTELY) 236 g solution    Sig: Take 4,000 mLs by mouth once for 1 dose.    Dispense:  4000 mL    Refill:  0    There are no discontinued medications.  Follow-up: No follow-ups on file.   Crecencio Mc, MD

## 2020-11-19 NOTE — Patient Instructions (Addendum)
Bless your heart!!!!   I want you to add colace (docusate )  200 mg  nightly  (this is a stool softener , you can get it at [facility])   Ok to take with your simvastatin at bedtime   ALSO:  Take 1  metamucil capsule  with breakfast  and lunch .     If you get desperate, a glycerin suppository will  gently help when get over the finish line when you can feel that you are CLOSE!  We will have an alternate plan if this does not work     I am referring you to Dr Haig Prophet   to have a colonoscopy

## 2020-11-21 DIAGNOSIS — K5649 Other impaction of intestine: Secondary | ICD-10-CM

## 2020-11-21 DIAGNOSIS — R102 Pelvic and perineal pain: Secondary | ICD-10-CM

## 2020-11-21 DIAGNOSIS — K5900 Constipation, unspecified: Secondary | ICD-10-CM | POA: Insufficient documentation

## 2020-11-21 DIAGNOSIS — K5909 Other constipation: Secondary | ICD-10-CM

## 2020-11-21 MED ORDER — PEG 3350-KCL-NABCB-NACL-NASULF 236 G PO SOLR: 4000.0000 mL | Freq: Once | ORAL | 0 refills | Status: AC

## 2020-11-21 NOTE — Assessment & Plan Note (Addendum)
New onset ,  For several months despite use of bulk forming laxatoves. Did not improve with addition of stool softener and glycerin supppository.  Will prescribe go lytely.   Change in bowel function warrants referral for diagnostic colonoscopy to rule out mass

## 2020-11-23 ENCOUNTER — Other Ambulatory Visit: Payer: Self-pay | Admitting: Internal Medicine

## 2020-11-23 MED ORDER — LACTULOSE 10 GM/15ML PO SOLN: ORAL | 1 refills | Status: DC

## 2020-11-25 NOTE — Telephone Encounter (Signed)
Pt has questions about the lactulose. It is not producing results.  Please call on [phone] or home phone.

## 2020-11-26 ENCOUNTER — Ambulatory Visit
Admission: RE | Admit: 2020-11-26 | Discharge: 2020-11-26 | Disposition: A | Discharge status: Home / Self Care | Payer: Medicare Other | Source: Ambulatory Visit | Attending: Internal Medicine | Admitting: Internal Medicine

## 2020-11-26 ENCOUNTER — Other Ambulatory Visit: Payer: Self-pay

## 2020-11-26 ENCOUNTER — Ambulatory Visit
Admission: RE | Admit: 2020-11-26 | Discharge: 2020-11-26 | Disposition: A | Discharge status: Home / Self Care | Payer: Medicare Other | Attending: Internal Medicine | Admitting: Internal Medicine

## 2020-11-26 DIAGNOSIS — K5909 Other constipation: Secondary | ICD-10-CM

## 2020-11-26 DIAGNOSIS — K5900 Constipation, unspecified: Secondary | ICD-10-CM | POA: Diagnosis not present

## 2020-11-26 DIAGNOSIS — R109 Unspecified abdominal pain: Secondary | ICD-10-CM | POA: Diagnosis not present

## 2020-11-26 MED ORDER — PEG 3350-KCL-NABCB-NACL-NASULF 236 G PO SOLR: ORAL | 0 refills | Status: DC

## 2020-12-09 ENCOUNTER — Telehealth: Payer: Self-pay | Admitting: Internal Medicine

## 2020-12-09 NOTE — Telephone Encounter (Signed)
Lft pt a vm with appt time date and location. Pt was advised NPO eat and drink 4 hrs prior. thanks

## 2020-12-09 NOTE — Addendum Note (Signed)
Addended by: Crecencio Mc on: 12/09/2020 10:25 AM   Modules accepted: Orders

## 2020-12-15 ENCOUNTER — Other Ambulatory Visit: Payer: Self-pay | Admitting: Internal Medicine

## 2020-12-23 ENCOUNTER — Other Ambulatory Visit: Payer: Self-pay

## 2020-12-23 ENCOUNTER — Ambulatory Visit
Admission: RE | Admit: 2020-12-23 | Discharge: 2020-12-23 | Disposition: A | Discharge status: Home / Self Care | Payer: Medicare Other | Source: Ambulatory Visit | Attending: Internal Medicine | Admitting: Internal Medicine

## 2020-12-23 DIAGNOSIS — I700 Atherosclerosis of aorta: Secondary | ICD-10-CM | POA: Diagnosis not present

## 2020-12-23 DIAGNOSIS — Z853 Personal history of malignant neoplasm of breast: Secondary | ICD-10-CM | POA: Diagnosis not present

## 2020-12-23 DIAGNOSIS — R102 Pelvic and perineal pain: Secondary | ICD-10-CM | POA: Insufficient documentation

## 2020-12-23 DIAGNOSIS — K5649 Other impaction of intestine: Secondary | ICD-10-CM | POA: Diagnosis not present

## 2020-12-23 DIAGNOSIS — K5909 Other constipation: Secondary | ICD-10-CM | POA: Diagnosis not present

## 2020-12-23 DIAGNOSIS — K5730 Diverticulosis of large intestine without perforation or abscess without bleeding: Secondary | ICD-10-CM | POA: Diagnosis not present

## 2020-12-23 DIAGNOSIS — K5900 Constipation, unspecified: Secondary | ICD-10-CM | POA: Diagnosis not present

## 2020-12-23 LAB — POCT I-STAT CREATININE: Creatinine, Ser: 0.8 mg/dL (ref 0.44–1.00)

## 2020-12-23 MED ORDER — IOHEXOL 300 MG/ML  SOLN
100.0000 mL | Freq: Once | INTRAMUSCULAR | Status: AC | PRN
  Administered 2020-12-23: 100 mL via INTRAVENOUS

## 2020-12-25 ENCOUNTER — Other Ambulatory Visit: Payer: Self-pay | Admitting: Internal Medicine

## 2020-12-25 ENCOUNTER — Telehealth: Payer: Self-pay | Admitting: Internal Medicine

## 2020-12-25 DIAGNOSIS — N838 Other noninflammatory disorders of ovary, fallopian tube and broad ligament: Secondary | ICD-10-CM

## 2020-12-25 NOTE — Telephone Encounter (Signed)
Lft pt vm to call ofc to sch US pelvic. thanks

## 2021-01-14 ENCOUNTER — Ambulatory Visit
Admission: RE | Admit: 2021-01-14 | Discharge: 2021-01-14 | Disposition: A | Discharge status: Home / Self Care | Payer: Medicare Other | Source: Ambulatory Visit | Attending: Internal Medicine | Admitting: Internal Medicine

## 2021-01-14 ENCOUNTER — Other Ambulatory Visit: Payer: Self-pay

## 2021-01-14 DIAGNOSIS — N838 Other noninflammatory disorders of ovary, fallopian tube and broad ligament: Secondary | ICD-10-CM | POA: Insufficient documentation

## 2021-01-14 DIAGNOSIS — N858 Other specified noninflammatory disorders of uterus: Secondary | ICD-10-CM | POA: Diagnosis not present

## 2021-01-14 DIAGNOSIS — N83201 Unspecified ovarian cyst, right side: Secondary | ICD-10-CM | POA: Diagnosis not present

## 2021-01-14 DIAGNOSIS — Z780 Asymptomatic menopausal state: Secondary | ICD-10-CM | POA: Diagnosis not present

## 2021-01-22 ENCOUNTER — Other Ambulatory Visit: Payer: Self-pay | Admitting: Internal Medicine

## 2021-01-22 DIAGNOSIS — Z1283 Encounter for screening for malignant neoplasm of skin: Secondary | ICD-10-CM | POA: Diagnosis not present

## 2021-01-22 DIAGNOSIS — Z85828 Personal history of other malignant neoplasm of skin: Secondary | ICD-10-CM | POA: Diagnosis not present

## 2021-01-22 DIAGNOSIS — L821 Other seborrheic keratosis: Secondary | ICD-10-CM | POA: Diagnosis not present

## 2021-01-22 DIAGNOSIS — L570 Actinic keratosis: Secondary | ICD-10-CM | POA: Diagnosis not present

## 2021-01-22 DIAGNOSIS — N83201 Unspecified ovarian cyst, right side: Secondary | ICD-10-CM

## 2021-01-22 DIAGNOSIS — D1801 Hemangioma of skin and subcutaneous tissue: Secondary | ICD-10-CM | POA: Diagnosis not present

## 2021-01-22 DIAGNOSIS — D225 Melanocytic nevi of trunk: Secondary | ICD-10-CM | POA: Diagnosis not present

## 2021-01-22 DIAGNOSIS — L812 Freckles: Secondary | ICD-10-CM | POA: Diagnosis not present

## 2021-01-27 DIAGNOSIS — K5900 Constipation, unspecified: Secondary | ICD-10-CM | POA: Diagnosis not present

## 2021-01-27 DIAGNOSIS — Z1211 Encounter for screening for malignant neoplasm of colon: Secondary | ICD-10-CM | POA: Diagnosis not present

## 2021-02-04 ENCOUNTER — Telehealth: Payer: Self-pay | Admitting: Internal Medicine

## 2021-02-04 NOTE — Telephone Encounter (Signed)
Olivia from [facility] is calling to see if the patient still needs to be seen by Dr.Cousins at their office.Their office received a referral with the patients demographics but it did not have the patient ultrasound report attached.Please call Olivia at [phone].

## 2021-02-05 NOTE — Telephone Encounter (Signed)
Pt does stil need to see Dr. Garwin Brothers. I have faxed them the [place] results.

## 2021-02-06 DIAGNOSIS — I700 Atherosclerosis of aorta: Secondary | ICD-10-CM

## 2021-02-23 DIAGNOSIS — N83291 Other ovarian cyst, right side: Secondary | ICD-10-CM | POA: Diagnosis not present

## 2021-02-23 DIAGNOSIS — Z853 Personal history of malignant neoplasm of breast: Secondary | ICD-10-CM | POA: Diagnosis not present

## 2021-02-23 DIAGNOSIS — Z124 Encounter for screening for malignant neoplasm of cervix: Secondary | ICD-10-CM | POA: Diagnosis not present

## 2021-02-27 NOTE — Addendum Note (Signed)
Addended by: Crecencio Mc on: 02/27/2021 12:32 PM   Modules accepted: Orders

## 2021-03-16 ENCOUNTER — Other Ambulatory Visit: Payer: Self-pay | Admitting: Internal Medicine

## 2021-03-16 ENCOUNTER — Telehealth: Payer: Self-pay | Admitting: Internal Medicine

## 2021-03-16 NOTE — Telephone Encounter (Signed)
Patient was referred to [facility] in [place]. Doctor there would like her to see Dr Polly Cobia in [place]. She would like to know if there is another doctor other then Dr Polly Cobia closer to [place]. She would like to know what Dr Derrel Nip thinks.

## 2021-03-16 NOTE — Telephone Encounter (Signed)
Pt called in stating she finally got an appointment with Dr. Polly Cobia and she would like to thank whoever schedule the appointment for her.

## 2021-03-16 NOTE — Telephone Encounter (Signed)
LMTCB

## 2021-03-17 NOTE — Telephone Encounter (Signed)
See my chart message

## 2021-03-18 DIAGNOSIS — H6123 Impacted cerumen, bilateral: Secondary | ICD-10-CM | POA: Diagnosis not present

## 2021-03-18 DIAGNOSIS — H903 Sensorineural hearing loss, bilateral: Secondary | ICD-10-CM | POA: Diagnosis not present

## 2021-04-13 ENCOUNTER — Telehealth: Payer: Self-pay | Admitting: Internal Medicine

## 2021-04-13 ENCOUNTER — Telehealth: Payer: Self-pay

## 2021-04-13 ENCOUNTER — Ambulatory Visit (INDEPENDENT_AMBULATORY_CARE_PROVIDER_SITE_OTHER): Payer: Medicare Other | Admitting: Adult Health

## 2021-04-13 ENCOUNTER — Other Ambulatory Visit: Payer: Self-pay

## 2021-04-13 ENCOUNTER — Encounter: Payer: Self-pay | Admitting: Adult Health

## 2021-04-13 ENCOUNTER — Ambulatory Visit
Admission: RE | Admit: 2021-04-13 | Discharge: 2021-04-13 | Disposition: A | Discharge status: Home / Self Care | Payer: Medicare Other | Source: Ambulatory Visit | Attending: Adult Health | Admitting: Adult Health

## 2021-04-13 VITALS — BP 144/78 | HR 76 | Temp 95.4°F | Ht 67.52 in | Wt 163.4 lb

## 2021-04-13 DIAGNOSIS — R42 Dizziness and giddiness: Secondary | ICD-10-CM | POA: Insufficient documentation

## 2021-04-13 DIAGNOSIS — R41840 Attention and concentration deficit: Secondary | ICD-10-CM | POA: Insufficient documentation

## 2021-04-13 DIAGNOSIS — G319 Degenerative disease of nervous system, unspecified: Secondary | ICD-10-CM | POA: Diagnosis not present

## 2021-04-13 DIAGNOSIS — R55 Syncope and collapse: Secondary | ICD-10-CM | POA: Diagnosis not present

## 2021-04-13 LAB — CBC WITH DIFFERENTIAL/PLATELET
Basophils Absolute: 0 10*3/uL (ref 0.0–0.1)
Basophils Relative: 0.2 % (ref 0.0–3.0)
Eosinophils Absolute: 0 10*3/uL (ref 0.0–0.7)
Eosinophils Relative: 0.3 % (ref 0.0–5.0)
HCT: 38.3 % (ref 36.0–46.0)
Hemoglobin: 12.9 g/dL (ref 12.0–15.0)
Lymphocytes Relative: 46.9 % — ABNORMAL HIGH (ref 12.0–46.0)
Lymphs Abs: 2.2 10*3/uL (ref 0.7–4.0)
MCHC: 33.7 g/dL (ref 30.0–36.0)
MCV: 98.2 fl (ref 78.0–100.0)
Monocytes Absolute: 0.5 10*3/uL (ref 0.1–1.0)
Monocytes Relative: 9.7 % (ref 3.0–12.0)
Neutro Abs: 2 10*3/uL (ref 1.4–7.7)
Neutrophils Relative %: 42.9 % — ABNORMAL LOW (ref 43.0–77.0)
Platelets: 218 10*3/uL (ref 150.0–400.0)
RBC: 3.9 Mil/uL (ref 3.87–5.11)
RDW: 12.8 % (ref 11.5–15.5)
WBC: 4.7 10*3/uL (ref 4.0–10.5)

## 2021-04-13 LAB — COMPREHENSIVE METABOLIC PANEL
ALT: 28 U/L (ref 0–35)
AST: 34 U/L (ref 0–37)
Albumin: 4 g/dL (ref 3.5–5.2)
Alkaline Phosphatase: 52 U/L (ref 39–117)
BUN: 20 mg/dL (ref 6–23)
CO2: 26 mEq/L (ref 19–32)
Calcium: 9.3 mg/dL (ref 8.4–10.5)
Chloride: 103 mEq/L (ref 96–112)
Creatinine, Ser: 0.73 mg/dL (ref 0.40–1.20)
GFR: 78.18 mL/min (ref >60.00)
Glucose, Bld: 77 mg/dL (ref 70–99)
Potassium: 4.4 mEq/L (ref 3.5–5.1)
Sodium: 139 mEq/L (ref 135–145)
Total Bilirubin: 0.4 mg/dL (ref 0.2–1.2)
Total Protein: 7 g/dL (ref 6.0–8.3)

## 2021-04-13 LAB — MAGNESIUM: Magnesium: 2 mg/dL (ref 1.5–2.5)

## 2021-04-13 LAB — TROPONIN I (HIGH SENSITIVITY): High Sens Troponin I: 5 ng/L (ref 2–17)

## 2021-04-13 NOTE — Telephone Encounter (Signed)
Placed call to pt to advise go to ER per provider. Unable to LVM

## 2021-04-13 NOTE — Patient Instructions (Addendum)
Dizziness Dizziness is a common problem. It is a feeling of unsteadiness or light-headedness. You may feel like you are about to faint. Dizziness can lead to injury if you stumble or fall. Anyone can become dizzy, but dizziness is more common in older adults. This condition can be caused by a number of things, including medicines, dehydration, or illness. Follow these instructions at home: Eating and drinking  Drink enough fluid to keep your urine pale yellow. This helps to keep you from becoming dehydrated. Try to drink more clear fluids, such as water. Do not drink alcohol. Limit your caffeine intake if told to do so by your health care provider. Check ingredients and nutrition facts to see if a food or beverage contains caffeine. Limit your salt (sodium) intake if told to do so by your health care provider. Check ingredients and nutrition facts to see if a food or beverage contains sodium. Activity  Avoid making quick movements. Rise slowly from chairs and steady yourself until you feel okay. In the morning, first sit up on the side of the bed. When you feel okay, stand slowly while you hold onto something until you know that your balance is good. If you need to stand in one place for a long time, move your legs often. Tighten and relax the muscles in your legs while you are standing. Do not drive or use machinery if you feel dizzy. Avoid bending down if you feel dizzy. Place items in your home so that they are easy for you to reach without leaning over. Lifestyle Do not use any products that contain nicotine or tobacco. These products include cigarettes, chewing tobacco, and vaping devices, such as e-cigarettes. If you need help quitting, ask your health care provider. Try to reduce your stress level by using methods such as yoga or meditation. Talk with your health care provider if you need help to manage your stress. General instructions Watch your dizziness for any changes. Take  over-the-counter and prescription medicines only as told by your health care provider. Talk with your health care provider if you think that your dizziness is caused by a medicine that you are taking. Tell a friend or a family member that you are feeling dizzy. If he or she notices any changes in your behavior, have this person call your health care provider. Keep all follow-up visits. This is important. Contact a health care provider if: Your dizziness does not go away or you have new symptoms. Your dizziness or light-headedness gets worse. You feel nauseous. You have reduced hearing. You have a fever. You have neck pain or a stiff neck. Your dizziness leads to an injury or a fall. Get help right away if: You vomit or have diarrhea and are unable to eat or drink anything. You have problems talking, walking, swallowing, or using your arms, hands, or legs. You feel generally weak. You have any bleeding. You are not thinking clearly or you have trouble forming sentences. It may take a friend or family member to notice this. You have chest pain, abdominal pain, shortness of breath, or sweating. Your vision changes or you develop a severe headache. These symptoms may represent a serious problem that is an emergency. Do not wait to see if the symptoms will go away. Get medical help right away. Call your local emergency services (911 in the [place] Do not drive yourself to the hospital. Summary Dizziness is a feeling of unsteadiness or light-headedness. This condition can be caused by a number of   things, including medicines, dehydration, or illness. Anyone can become dizzy, but dizziness is more common in older adults. Drink enough fluid to keep your urine pale yellow. Do not drink alcohol. Avoid making quick movements if you feel dizzy. Monitor your dizziness for any changes. This information is not intended to replace advice given to you by your health care provider. Make sure you discuss any  questions you have with your health care provider. Document Revised: 03/24/2020 Document Reviewed: 03/24/2020 Elsevier Patient Education  2022 [place]. Syncope, Adult Syncope is when you pass out or faint for a short time. It is caused by a sudden decrease in blood flow to the brain. This can happen for many reasons. It can sometimes happen when seeing blood, getting a shot (injection), or having pain or strong emotions. Most causes of fainting are not dangerous, but in some cases it can be a sign of a serious medical problem. If you faint, get help right away. Call your local emergency services (911 in the [place] Follow these instructions at home: Watch for any changes in your symptoms. Take these actions to stay safe and help with your symptoms: Knowing when you may be about to faint Signs that you may be about to faint include: Feeling dizzy or light-headed. It may feel like the room is spinning. Feeling weak. Feeling like you may vomit (nauseous). Seeing spots or seeing all white or all black. Having cold, clammy skin. Feeling warm and sweaty. Hearing ringing in the ears. If you start to feel like you might faint, sit or lie down right away. If sitting, lower your head down between your legs. If lying down, raise (elevate) your feet above the level of your heart. Breathe deeply and steadily. Wait until all of the symptoms are gone. Have someone stay with you until you feel better. Medicines Take over-the-counter and prescription medicines only as told by your doctor. If you are taking blood pressure or heart medicine, sit up and stand up slowly. Spend a few minutes getting ready to sit and then stand. This can help you feel less dizzy. Lifestyle Do not drive, use machinery, or play sports until your doctor says it is okay. Do not drink alcohol. Do not smoke or use any products that contain nicotine or tobacco. If you need help quitting, ask your doctor. Avoid hot tubs and  saunas. General instructions Talk with your doctor about your symptoms. You may need to have testing to help find the cause. Drink enough fluid to keep your pee (urine) pale yellow. Avoid standing for a long time. If you must stand for a long time, do movements such as: Moving your legs. Crossing your legs. Flexing and stretching your leg muscles. Squatting. Keep all follow-up visits. Contact a doctor if: You have episodes of near fainting. Get help right away if: You pass out or faint. You hit your head or are injured after fainting. You have any of these symptoms: Fast or uneven heartbeats (palpitations). Pain in your chest, belly, or back. Shortness of breath. You have jerky movements that you cannot control (seizure). You have a very bad headache. You are confused. You have problems with how you see (vision). You are very weak. You have trouble walking. You are bleeding from your mouth or your butt (rectum). You have black or tarry poop (stool). These symptoms may be an emergency. Get help right away. Call your local emergency services (911 in the [place] Do not wait to see if the symptoms will  go away. Do not drive yourself to the hospital. Summary Syncope is when you pass out or faint for a short time. It is caused by a sudden decrease in blood flow to the brain. Signs that you may be about to faint include feeling dizzy or light-headed, feeling like you may vomit, seeing all white or all black, or having cold, clammy skin. If you start to feel like you might faint, sit or lie down right away. Lower your head if sitting, or raise (elevate) your feet if lying down. Breathe deeply and steadily. Wait until all of the symptoms are gone. This information is not intended to replace advice given to you by your health care provider. Make sure you discuss any questions you have with your health care provider. Document Revised: 08/28/2020 Document Reviewed: 08/28/2020 Elsevier Patient  Education  2022 [employer].

## 2021-04-13 NOTE — Telephone Encounter (Signed)
Pt scheduled today 12/12 with Sharyn Lull at 2:30

## 2021-04-13 NOTE — Telephone Encounter (Signed)
Lft pt vm on husband phone to call ofc to sch CT .thanks

## 2021-04-13 NOTE — Telephone Encounter (Signed)
If she is feeling like she is going to pass out she needs to be evaluated in the emergency room, please triage.

## 2021-04-13 NOTE — Telephone Encounter (Signed)
Noted, it said patient had syncope last week and feeling like she would have the same this week, I did not see diarrhea on access report.

## 2021-04-13 NOTE — Progress Notes (Signed)
Troponin negative.

## 2021-04-13 NOTE — Telephone Encounter (Addendum)
Tried to reach patient phone goes directly to voicemail I was able to pull the Team health report. Lookks like it say weakness due to poor fluid intake so wondering nausea diarrhea?

## 2021-04-13 NOTE — Telephone Encounter (Signed)
noted 

## 2021-04-13 NOTE — Addendum Note (Signed)
Addended by: Leeanne Rio on: 04/13/2021 03:40 PM   Modules accepted: Orders

## 2021-04-13 NOTE — Telephone Encounter (Signed)
Patient called and stated that last Wednesday, 04/08/2021 she felt weak and dizzy and passed out. She is feeling the same way this morning. Patient transferred to Access Nurse to be triaged.

## 2021-04-13 NOTE — Progress Notes (Addendum)
Acute Office Visit  Subjective:    Patient ID: LEELAH HANNA, female    DOB: [birth year], 80 y.o.   MRN: [id number]  Chief Complaint  Patient presents with   Dizziness   Loss of Consciousness    Loss of Consciousness This is a new problem. The current episode started 1 to 4 weeks ago. The problem occurs intermittently. The problem has been unchanged. Nothing aggravates the symptoms. Associated symptoms include dizziness, light-headedness and weakness. Pertinent negatives include no abdominal pain, auditory change, aura, back pain, diaphoresis or fever.   Last Wednesday she passed out at church while playing hand bells, she started to feel dizzy and passed out she is unsure how long she was out, it was witnessed. Denies any twitching or shaking. EMS was called and she refused ER. Denies any palpitations.  She denies knowing how long she was passed out, no one was able to tell her.  She was awake by the time the EMS arrived.  She denies any diaphoresis that time denies any chest pain denies any palpitations. She does have an appoint with Dr. Rockey Situ in January we will try to see if he will evaluate her further and sooner for syncopal episode. Denies any head injury.  Blood pressure was normal at the time she reports. She denies any head injury or trauma. She did have a " cold a few weeks ago".  History of constipation is better now.  She denies any past syncopal episodes.  She had eaten that day that she was playing the bills. She does feel she is having some increased difficulty concentrating at times she is staying very busy during the holidays then playing bells at her church frequently. Able to perform her daily tasks.  Denies any new medication changes.  She denies any diarrhea.  She is drinking well she reports she is trying to drink Gatorade since all this happened. She did have an episode earlier this morning where she felt like she could become syncopal again but then she sat down  and she felt fine.  She has no symptoms currently in the office now. Being evaluated for the following, has a follow-up appointment tomorrow. IMPRESSION: 2.6 cm rounded low density is noted in right ovary. Pelvic ultrasound is recommended for further evaluation. Sigmoid diverticulosis without inflammation. Aortic Atherosclerosis (ICD10-I70.0). Electronically Signed   By: Marijo Conception M.D.   On: 12/24/2020 08:20  Patient  denies any fever, body aches,chills, rash, chest pain, shortness of breath, nausea, vomiting, or diarrhea.    Past Medical History:  Diagnosis Date   Breast cancer ([place]) 2000   RT MASTECTOMY   GERD (gastroesophageal reflux disease)    Hyperlipidemia    Personal history of chemotherapy 2000   BREAST CA   Personal history of radiation therapy 2000   BREAST CA    Past Surgical History:  Procedure Laterality Date   BREAST SURGERY     mastectomy, left breast    MASTECTOMY Right 2000   BREAST CA    Family History  Problem Relation Age of Onset   Hypertension Mother    Heart disease Father    Cancer Maternal Grandmother    Breast cancer Neg Hx     Social History   Socioeconomic History   Marital status: Married    Spouse name: Not on file   Number of children: Not on file   Years of education: Not on file   Highest education level: Not on file  Occupational History  Not on file  Tobacco Use   Smoking status: Never   Smokeless tobacco: Never  Vaping Use   Vaping Use: Never used  Substance and Sexual Activity   Alcohol use: Yes    Comment: OCC   Drug use: No   Sexual activity: Not on file  Other Topics Concern   Not on file  Social History Narrative   Married    Oceanographer    Social Determinants of Health   Financial Resource Strain: Low Risk    Difficulty of Paying Living Expenses: Not hard at all  Food Insecurity: No Food Insecurity   Worried About Charity fundraiser in the Last Year: Never true   [place] in the  Last Year: Never true  Transportation Needs: No Transportation Needs   Lack of Transportation (Medical): No   Lack of Transportation (Non-Medical): No  Physical Activity: Insufficiently Active   Days of Exercise per Week: 3 days   Minutes of Exercise per Session: 30 min  Stress: No Stress Concern Present   Feeling of Stress : Not at all  Social Connections: Socially Integrated   Frequency of Communication with Friends and Family: More than three times a week   Frequency of Social Gatherings with Friends and Family: More than three times a week   Attends Religious Services: 1 to 4 times per year   Active Member of [employer] or Organizations: Yes   Attends Archivist Meetings: 1 to 4 times per year   Marital Status: Married  Human resources officer Violence: Not At Risk   Fear of Current or Ex-Partner: No   Emotionally Abused: No   Physically Abused: No   Sexually Abused: No    Outpatient Medications Prior to Visit  Medication Sig Dispense Refill   aspirin 81 MG tablet Take 81 mg by mouth daily.     Azelastine HCl 0.15 % SOLN Place 2 sprays into the nose daily as needed. 30 mL 2   Docusate Sodium (DSS) 100 MG CAPS Take by mouth.     hydrocortisone 2.5 % cream Apply topically 2 (two) times daily. 30 g 0   Multiple Vitamin (MULTIVITAMIN) tablet Take 1 tablet by mouth daily.     Psyllium (METAMUCIL PO) Take 2 tablets by mouth 2 (two) times daily.     simvastatin (ZOCOR) 40 MG tablet Take one tablet by mouth every evening 90 tablet 0   lactulose (CHRONULAC) 10 GM/15ML solution Drink 30 ml every 4 hours until constipation is relieved (Patient not taking: Reported on 04/13/2021) 236 mL 1   polyethylene glycol (GOLYTELY) 236 g solution Drink 8 ounce every 30 minutes until stool runs clear (Patient not taking: Reported on 04/13/2021) 4000 mL 0   No facility-administered medications prior to visit.    No Known Allergies  Review of Systems  Constitutional:  Positive for fatigue. Negative  for activity change, appetite change, chills, diaphoresis, fever and unexpected weight change.  HENT: Negative.    Respiratory: Negative.    Cardiovascular: Negative.   Gastrointestinal: Negative.  Negative for abdominal pain.  Genitourinary: Negative.   Musculoskeletal: Negative.  Negative for back pain.  Neurological:  Positive for dizziness, weakness and light-headedness. Negative for tremors, seizures, syncope and speech difficulty.  Psychiatric/Behavioral:  Positive for decreased concentration.       Objective:    Physical Exam Vitals reviewed.  Constitutional:      General: She is not in acute distress.    Appearance: She is well-developed. She  is not diaphoretic.     Interventions: She is not intubated.    Comments: Patient appers well, not sickly. Speaking in complete sentences. Patient moves on and off of exam table and in room without difficulty. Gait is normal in hall and in room. Patient is oriented to person place time and situation. Patient answers questions appropriately and engages eye contact and verbal dialect with provider.    She did not drive to the office she is having her husband drive her.  HENT:     Head: Normocephalic and atraumatic.     Right Ear: External ear normal.     Left Ear: External ear normal.     Nose: Nose normal.     Mouth/Throat:     Pharynx: No oropharyngeal exudate.  Eyes:     General: Lids are normal. No scleral icterus.       Right eye: No discharge.        Left eye: No discharge.     Conjunctiva/sclera: Conjunctivae normal.     Right eye: Right conjunctiva is not injected. No exudate or hemorrhage.    Left eye: Left conjunctiva is not injected. No exudate or hemorrhage.    Pupils: Pupils are equal, round, and reactive to light.  Neck:     Thyroid: No thyroid mass or thyromegaly.     Vascular: Normal carotid pulses. No carotid bruit, hepatojugular reflux or JVD.     Trachea: Trachea and phonation normal. No tracheal tenderness or  tracheal deviation.     Meningeal: Brudzinski's sign and Kernig's sign absent.  Cardiovascular:     Rate and Rhythm: Normal rate and regular rhythm.     Pulses: Normal pulses.          Radial pulses are 2+ on the right side and 2+ on the left side.       Dorsalis pedis pulses are 2+ on the right side and 2+ on the left side.       Posterior tibial pulses are 2+ on the right side and 2+ on the left side.     Heart sounds: Normal heart sounds, S1 normal and S2 normal. Heart sounds not distant. No murmur heard.   No friction rub. No gallop.  Pulmonary:     Effort: Pulmonary effort is normal. No tachypnea, bradypnea, accessory muscle usage or respiratory distress. She is not intubated.     Breath sounds: Normal breath sounds. No stridor. No wheezing or rales.  Chest:     Chest wall: No tenderness.  Abdominal:     General: Bowel sounds are normal. There is no distension or abdominal bruit.     Palpations: Abdomen is soft. There is no shifting dullness, fluid wave, hepatomegaly, splenomegaly, mass or pulsatile mass.     Tenderness: There is no abdominal tenderness. There is no guarding or rebound.     Hernia: No hernia is present.  Musculoskeletal:        General: No tenderness or deformity. Normal range of motion.     Cervical back: Full passive range of motion without pain, normal range of motion and neck supple. No edema, erythema or rigidity. No spinous process tenderness or muscular tenderness. Normal range of motion.  Lymphadenopathy:     Head:     Right side of head: No submental, submandibular, tonsillar, preauricular, posterior auricular or occipital adenopathy.     Left side of head: No submental, submandibular, tonsillar, preauricular, posterior auricular or occipital adenopathy.     Cervical: No cervical  adenopathy.     Right cervical: No superficial, deep or posterior cervical adenopathy.    Left cervical: No superficial, deep or posterior cervical adenopathy.     Upper Body:      Right upper body: No supraclavicular or pectoral adenopathy.     Left upper body: No supraclavicular or pectoral adenopathy.  Skin:    General: Skin is warm and dry.     Coloration: Skin is not pale.     Findings: No abrasion, bruising, burn, ecchymosis, erythema, lesion, petechiae or rash.     Nails: There is no clubbing.  Neurological:     Mental Status: She is alert and oriented to person, place, and time.     GCS: GCS eye subscore is 4. GCS verbal subscore is 5. GCS motor subscore is 6.     Cranial Nerves: Cranial nerves 2-12 are intact. No cranial nerve deficit.     Sensory: Sensation is intact. No sensory deficit.     Motor: Motor function is intact. No tremor, atrophy, abnormal muscle tone or seizure activity.     Coordination: Romberg sign positive. Coordination normal. Finger-Nose-Finger Test and Heel to [facility] Test normal. Rapid alternating movements normal.     Gait: Gait and tandem walk normal.     Deep Tendon Reflexes: Reflexes are normal and symmetric. Reflexes normal. Babinski sign absent on the right side. Babinski sign absent on the left side.     Reflex Scores:      Tricep reflexes are 2+ on the right side and 2+ on the left side.      Bicep reflexes are 2+ on the right side and 2+ on the left side.      Brachioradialis reflexes are 2+ on the right side and 2+ on the left side.      Patellar reflexes are 2+ on the right side and 2+ on the left side.      Achilles reflexes are 2+ on the right side and 2+ on the left side. Psychiatric:        Speech: Speech normal.        Behavior: Behavior normal.        Thought Content: Thought content normal.        Judgment: Judgment normal.    BP (!) 144/78   Pulse 76   Temp (!) 95.4 F (35.2 C)   Ht 5' 7.52" (1.715 m)   Wt 163 lb 6.4 oz (74.1 kg)   SpO2 98%   BMI 25.20 kg/m  Wt Readings from Last 3 Encounters:  04/13/21 163 lb 6.4 oz (74.1 kg)  11/19/20 167 lb 12.8 oz (76.1 kg)  09/17/20 164 lb (74.4 kg)    Vitals with  BMI 04/13/2021 11/19/2020 09/17/2020  Height 5' 7.52" 5' 7.5" 5' 7.5"  Weight 163 lbs 6 oz 167 lbs 13 oz 164 lbs  BMI 25.2 68.11 57.26  Systolic 203 559 (No Data)  Diastolic 78 80 (No Data)  Pulse 76 85 -     There are no preventive care reminders to display for this patient.  There are no preventive care reminders to display for this patient.   Lab Results  Component Value Date   TSH 3.01 06/24/2020   Lab Results  Component Value Date   WBC 6.4 06/18/2019   HGB 14.4 06/18/2019   HCT 42.4 06/18/2019   MCV 99.4 06/18/2019   PLT 187.0 06/18/2019   Lab Results  Component Value Date   NA 140 06/24/2020   K  4.3 06/24/2020   CO2 29 06/24/2020   GLUCOSE 86 06/24/2020   BUN 17 06/24/2020   CREATININE 0.80 12/23/2020   BILITOT 0.7 06/24/2020   ALKPHOS 52 06/24/2020   AST 22 06/24/2020   ALT 18 06/24/2020   PROT 7.0 06/24/2020   ALBUMIN 4.3 06/24/2020   CALCIUM 9.7 06/24/2020   GFR 67.40 06/24/2020   Lab Results  Component Value Date   CHOL 167 06/24/2020   Lab Results  Component Value Date   HDL 71.60 06/24/2020   Lab Results  Component Value Date   LDLCALC 70 06/24/2020   Lab Results  Component Value Date   TRIG 124.0 06/24/2020   Lab Results  Component Value Date   CHOLHDL 2 06/24/2020   No results found for: HGBA1C     Assessment & Plan:   Problem List Items Addressed This Visit       Other   Syncope   Relevant Orders   CBC with Differential/Platelet   Comprehensive metabolic panel   Troponin I   Magnesium   CT HEAD WO CONTRAST (5MM)   CK Total (and CKMB)   Ambulatory referral to Cardiology   Ambulatory referral to Neurology   Difficulty concentrating   Dizzy - Primary   Relevant Orders   EKG 12-Lead (Completed)   CT HEAD WO CONTRAST (5MM)   COVID-19, Flu A+B and RSV   Ambulatory referral to Cardiology   Discussed 911 immediately/  ER if any syncope occurs or if any symptoms worsening. Have asked her not to drive until evaluated by  specialty.  She has a CT ordered today of head.  EKG reviewed and normal. Will have cardiology evaluate sooner than January given her syncope, she will have upcoming possible abdominal surgery and will need clearance.  Red Flags discussed. The patient was given clear instructions to go to ER or return to medical center if any red flags develop, symptoms do not improve, worsen or new problems develop. They verbalized understanding.  Labs today.  No orders of the defined types were placed in this encounter.  Return in about 2 weeks (around 04/27/2021), or if symptoms worsen or fail to improve, for at any time for any worsening symptoms, Go to Emergency room/ urgent care if worse.   Marcille Buffy, FNP

## 2021-04-14 LAB — CK TOTAL AND CKMB (NOT AT ARMC)
CK, MB: <0.7 ng/mL (ref 0–5.0)
Total CK: 59 U/L (ref 29–143)

## 2021-04-14 NOTE — Progress Notes (Signed)
CKMB and total CK within normal limits.

## 2021-04-14 NOTE — Progress Notes (Signed)
No acute infarction.no acute process found.  Signs of normal aging of the brain and chronic microvascular changes.  Keep follow up with neurology, as referred 04/12/21. Emergency room if any symptoms worsen or change at anytime.

## 2021-04-14 NOTE — Progress Notes (Signed)
Magnesium, CMP,  within normal limits.  CBC is within normal variant normal. Troponin was negative.  Covid, flu, rsv swab still pending and CKMB

## 2021-04-15 ENCOUNTER — Encounter: Payer: Self-pay | Admitting: Internal Medicine

## 2021-04-15 LAB — COVID-19, FLU A+B AND RSV
Influenza A, NAA: NOT DETECTED
Influenza B, NAA: NOT DETECTED
RSV, NAA: NOT DETECTED
SARS-CoV-2, NAA: NOT DETECTED

## 2021-04-15 NOTE — Progress Notes (Signed)
Covid, flu, and RSV within normal limits.

## 2021-04-16 ENCOUNTER — Other Ambulatory Visit: Payer: Self-pay

## 2021-04-16 ENCOUNTER — Encounter: Payer: Self-pay | Admitting: Psychiatry

## 2021-04-16 ENCOUNTER — Ambulatory Visit (INDEPENDENT_AMBULATORY_CARE_PROVIDER_SITE_OTHER): Payer: Medicare Other | Admitting: Psychiatry

## 2021-04-16 VITALS — BP 145/64 | HR 75 | Ht 67.5 in | Wt 163.6 lb

## 2021-04-16 DIAGNOSIS — R55 Syncope and collapse: Secondary | ICD-10-CM | POA: Diagnosis not present

## 2021-04-16 DIAGNOSIS — R1900 Intra-abdominal and pelvic swelling, mass and lump, unspecified site: Secondary | ICD-10-CM | POA: Diagnosis not present

## 2021-04-16 NOTE — Progress Notes (Signed)
[facility]  PATIENT: Lori Caldwell DOB: Jan 14, 1941  REFERRING CLINICIAN: Crecencio Mc, MD HISTORY FROM: self and husband REASON FOR VISIT: syncope   HISTORICAL  CHIEF COMPLAINT:  Chief Complaint  Patient presents with   Syncope    Rm 1 New Pt  husband- Rush Landmark "passed out once, was very tired, had dizziness prior to passing out"    HISTORY OF PRESENT ILLNESS:  The patient presents for evaluation of syncope. She was at church last week when she was playing handbells. Stood for about 45 minutes then sat down. When she went to stand up she felt light-headed and passed out. Was told she had a blank stare beforehand. Denies vertigo, vision changes, palpitations, or diaphoresis. Was caught by a friend and did not hit her head. She was out for ~1 minute. There was no shaking or jerking of her limbs but was told she may have been trembling. No incontinence or tongue biting. Came to quickly and was immediately back to her cognitive baseline. States she was tired that day and had not eaten recently. Had a CT brain on 04/13/21 which was normal.  She has not had any more syncopal episodes. Has noticed she will get slightly light headed if standing for a long time.  Notes she gets overheated during the summer which does make her light-headed. Did not feel warm while at church that day.  She is seeing Cardiology in January 2023.  OTHER MEDICAL CONDITIONS: ovarian cyst   REVIEW OF SYSTEMS: Full 14 system review of systems performed and negative with exception of: syncope  ALLERGIES: No Known Allergies  HOME MEDICATIONS: Outpatient Medications Prior to Visit  Medication Sig Dispense Refill   aspirin 81 MG tablet Take 81 mg by mouth daily.     Docusate Sodium (DSS) 100 MG CAPS Take by mouth.     Multiple Vitamin (MULTIVITAMIN) tablet Take 1 tablet by mouth daily.     simvastatin (ZOCOR) 40 MG tablet Take one tablet by mouth every evening 90 tablet 0   lactulose  (CHRONULAC) 10 GM/15ML solution Drink 30 ml every 4 hours until constipation is relieved (Patient not taking: Reported on 04/13/2021) 236 mL 1   Psyllium (METAMUCIL PO) Take 2 tablets by mouth 2 (two) times daily. (Patient not taking: Reported on 04/16/2021)     Azelastine HCl 0.15 % SOLN Place 2 sprays into the nose daily as needed. 30 mL 2   hydrocortisone 2.5 % cream Apply topically 2 (two) times daily. 30 g 0   polyethylene glycol (GOLYTELY) 236 g solution Drink 8 ounce every 30 minutes until stool runs clear (Patient not taking: Reported on 04/13/2021) 4000 mL 0   No facility-administered medications prior to visit.    PAST MEDICAL HISTORY: Past Medical History:  Diagnosis Date   Breast cancer ([place]) 2000   RT MASTECTOMY   GERD (gastroesophageal reflux disease)    Hyperlipidemia    Personal history of chemotherapy 2000   BREAST CA   Personal history of radiation therapy 2000   BREAST CA    PAST SURGICAL HISTORY: Past Surgical History:  Procedure Laterality Date   BREAST SURGERY     mastectomy, left breast    MASTECTOMY Right 2000   BREAST CA    FAMILY HISTORY: Family History  Problem Relation Age of Onset   Hypertension Mother    Heart disease Father    Cancer Maternal Grandmother    Breast cancer Neg Hx     SOCIAL HISTORY: Social  History   Socioeconomic History   Marital status: Married    Spouse name: Rush Landmark   Number of children: Not on file   Years of education: Not on file   Highest education level: Bachelor's degree (e.g., BA, AB, BS)  Occupational History   Not on file  Tobacco Use   Smoking status: Never   Smokeless tobacco: Never  Vaping Use   Vaping Use: Never used  Substance and Sexual Activity   Alcohol use: Yes    Comment: OCC   Drug use: No   Sexual activity: Not on file  Other Topics Concern   Not on file  Social History Narrative   Married    Oceanographer    Social Determinants of Health   Financial Resource Strain: Low Risk     Difficulty of Paying Living Expenses: Not hard at all  Food Insecurity: No Food Insecurity   Worried About Charity fundraiser in the Last Year: Never true   [place] in the Last Year: Never true  Transportation Needs: No Transportation Needs   Lack of Transportation (Medical): No   Lack of Transportation (Non-Medical): No  Physical Activity: Insufficiently Active   Days of Exercise per Week: 3 days   Minutes of Exercise per Session: 30 min  Stress: No Stress Concern Present   Feeling of Stress : Not at all  Social Connections: Socially Integrated   Frequency of Communication with Friends and Family: More than three times a week   Frequency of Social Gatherings with Friends and Family: More than three times a week   Attends Religious Services: 1 to 4 times per year   Active Member of [employer] or Organizations: Yes   Attends Archivist Meetings: 1 to 4 times per year   Marital Status: Married  Human resources officer Violence: Not At Risk   Fear of Current or Ex-Partner: No   Emotionally Abused: No   Physically Abused: No   Sexually Abused: No     PHYSICAL EXAM  GENERAL EXAM/CONSTITUTIONAL: Vitals:  Vitals:   04/16/21 1010  BP: (!) 145/64  Pulse: 75  Weight: 163 lb 9.6 oz (74.2 kg)  Height: 5' 7.5" (1.715 m)   Body mass index is 25.25 kg/m. Wt Readings from Last 3 Encounters:  04/16/21 163 lb 9.6 oz (74.2 kg)  04/13/21 163 lb 6.4 oz (74.1 kg)  11/19/20 167 lb 12.8 oz (76.1 kg)   Patient is in no distress; well developed, nourished and groomed; neck is supple  CARDIOVASCULAR: Examination of peripheral vascular system by observation and palpation is normal  EYES: Pupils round and reactive to light, Visual fields full to confrontation, Extraocular movements intacts,   MUSCULOSKELETAL: Gait, strength, tone, movements noted in Neurologic exam below  NEUROLOGIC: MENTAL STATUS:  MMSE - Mini Mental State Exam 05/31/2017  Orientation to time 5  Orientation  to Place 5  Registration 3  Attention/ Calculation 5  Recall 3  Language- name 2 objects 2  Language- repeat 1  Language- follow 3 step command 3  Language- read & follow direction 1  Write a sentence 1  Copy design 1  Total score 30   awake, alert, oriented to person, place and time recent and remote memory intact normal attention and concentration language fluent, comprehension intact, naming intact fund of knowledge appropriate  CRANIAL NERVE:  2nd, 3rd, 4th, 6th - pupils equal and reactive to light, visual fields full to confrontation, extraocular muscles intact, no nystagmus 5th - facial  sensation symmetric 7th - facial strength symmetric 8th - hearing intact 9th - palate elevates symmetrically, uvula midline 11th - shoulder shrug symmetric 12th - tongue protrusion midline  MOTOR:  normal bulk and tone, full strength in the BUE, BLE  SENSORY:  normal and symmetric to light touch all 4 extremities  COORDINATION:  finger-nose-finger, heel to shin normal  REFLEXES:  deep tendon reflexes present and symmetric  GAIT/STATION:  normal     DIAGNOSTIC DATA (LABS, IMAGING, TESTING) - I reviewed patient records, labs, notes, testing and imaging myself where available.  Lab Results  Component Value Date   WBC 4.7 04/13/2021   HGB 12.9 04/13/2021   HCT 38.3 04/13/2021   MCV 98.2 04/13/2021   PLT 218.0 04/13/2021      Component Value Date/Time   NA 139 04/13/2021 1522   NA 137 11/14/2012 0000   K 4.4 04/13/2021 1522   CL 103 04/13/2021 1522   CO2 26 04/13/2021 1522   GLUCOSE 77 04/13/2021 1522   BUN 20 04/13/2021 1522   BUN 13 11/14/2012 0000   CREATININE 0.73 04/13/2021 1522   CALCIUM 9.3 04/13/2021 1522   PROT 7.0 04/13/2021 1522   ALBUMIN 4.0 04/13/2021 1522   AST 34 04/13/2021 1522   ALT 28 04/13/2021 1522   ALKPHOS 52 04/13/2021 1522   BILITOT 0.4 04/13/2021 1522   Lab Results  Component Value Date   CHOL 167 06/24/2020   HDL 71.60 06/24/2020    LDLCALC 70 06/24/2020   LDLDIRECT 117.8 06/25/2013   TRIG 124.0 06/24/2020   CHOLHDL 2 06/24/2020   No results found for: HGBA1C No results found for: VITAMINB12 Lab Results  Component Value Date   TSH 3.01 06/24/2020     ASSESSMENT AND PLAN  80 y.o. year old female with a history of ovarian cyst (currently undergoing workup) who presents for evaluation of a syncopal episode. CTH and neurological exam today are normal. She did not have any seizure-like activity or stroke-like symptoms when she passed out and was not confused afterwards. Suspect she had vasovagal syncope due to prolonged standing. Possibly had low blood sugar as well as she had not eaten anything prior to the event. Advised her to maintain good hydration and make sure she has eaten the next time she will be standing for a prolonged period of time.  1. Vasovagal syncope       PLAN: -No further neurologic workup at this time -She will see Cardiology next month -Will return to clinic if she develops new neurologic symptoms   Return if symptoms worsen or fail to improve.    Genia Harold, MD 04/16/21 11:02 AM  I spent an average of 32 minutes chart reviewing and counseling the patient, with at least 50% of the time face to face with the patient.   [facility] [address] [place], [place] [address] [phone]

## 2021-04-29 ENCOUNTER — Ambulatory Visit (INDEPENDENT_AMBULATORY_CARE_PROVIDER_SITE_OTHER): Payer: Medicare Other | Admitting: Internal Medicine

## 2021-04-29 ENCOUNTER — Encounter: Payer: Self-pay | Admitting: Internal Medicine

## 2021-04-29 ENCOUNTER — Other Ambulatory Visit: Payer: Self-pay

## 2021-04-29 DIAGNOSIS — N83209 Unspecified ovarian cyst, unspecified side: Secondary | ICD-10-CM | POA: Insufficient documentation

## 2021-04-29 DIAGNOSIS — R55 Syncope and collapse: Secondary | ICD-10-CM | POA: Diagnosis not present

## 2021-04-29 DIAGNOSIS — N83201 Unspecified ovarian cyst, right side: Secondary | ICD-10-CM | POA: Diagnosis not present

## 2021-04-29 DIAGNOSIS — K5900 Constipation, unspecified: Secondary | ICD-10-CM

## 2021-04-29 NOTE — Assessment & Plan Note (Signed)
Mildly complex,  Incidental finding on CT during workup for new onset constipation.  For BSO end of January by Dr Polly Cobia in [place]

## 2021-04-29 NOTE — Assessment & Plan Note (Signed)
Now managed with use of colace daily.  Advised to postpone colonoscopy until after her GYN surgery

## 2021-04-29 NOTE — Patient Instructions (Addendum)
I would postpone your colonoscopy since your bowels are moving fine now.  Continue to wok on getting 60 ounces of water or non caffeinated beverages daily    Continue colace at bedtime   Make Sure Dr Rockey Situ treats your visit as a "preoperative cardiac clearance" and sends the results to Dr Polly Cobia   If you can photograph the results from Dr Polly Cobia and send them to me via [place]  I will try to interpret them for you

## 2021-04-29 NOTE — Assessment & Plan Note (Addendum)
Isolated episode occurring during handbell choir practice 3 weeks ago: Presumed secondary to orthostatic hypotension in the setting of exhaustion, fasting state and sudden changes in position.  EKG,  Labs , head CT reviewed today

## 2021-04-29 NOTE — Progress Notes (Signed)
Subjective:  Patient ID: Lori Caldwell, female    DOB: Apr 14, 1941  Age: 80 y.o. MRN: [id number]  CC: Diagnoses of Vasovagal syncope, Constipation, unspecified constipation type, and Cyst of right ovary were pertinent to this visit.  HPI Lori Caldwell presents for follow up on multiple issues Chief Complaint  Patient presents with   Follow-up    2 week follow up on dizziness    1) complex right ovarian cyst: incidental finding in October during workup for new onset constipation.  Referred by Dr Garwin Brothers to [facility]  for surgical evaluation which may include hysterectomy. Surgery is planned by Dr. Polly Cobia on January  30 on [place]   2) a syncopal event in early Dec,  refused ER evaluation,,  episode occurred while playing handbell choir at [employer].  Had been overextended for several weeks prior to event. .  Had not eaten that morning.  LOC 1 minute;  no seizure activity.   evaluated by Laverna Peace with labs/head CT  on Dec 12; no acute changes  She has had neurology follow up ,: neuro exam was benign and cardiology follow up is planned in January   3) sees Dr Rockey Situ on Jan 4 for cardiac clearance requested by [place] . Patient scheduled the appt herself,  did not specify that it was a preoperative visit.   4) Constipation;  resolved,  now using colace daily.  Has colonoscopy planned for Jan 17 . Advised to postpone given upcoming GYN surgery  Outpatient Medications Prior to Visit  Medication Sig Dispense Refill   aspirin 81 MG tablet Take 81 mg by mouth daily.     Docusate Sodium (DSS) 100 MG CAPS Take by mouth.     lactulose (CHRONULAC) 10 GM/15ML solution Drink 30 ml every 4 hours until constipation is relieved 236 mL 1   Multiple Vitamin (MULTIVITAMIN) tablet Take 1 tablet by mouth daily.     Psyllium (METAMUCIL PO) Take 2 tablets by mouth 2 (two) times daily.     simvastatin (ZOCOR) 40 MG tablet Take one tablet by mouth every evening 90 tablet 0   No facility-administered  medications prior to visit.    Review of Systems;  Patient denies headache, fevers, malaise, unintentional weight loss, skin rash, eye pain, sinus congestion and sinus pain, sore throat, dysphagia,  hemoptysis , cough, dyspnea, wheezing, chest pain, palpitations, orthopnea, edema, abdominal pain, nausea, melena, diarrhea, constipation, flank pain, dysuria, hematuria, urinary  Frequency, nocturia, numbness, tingling, seizures,  Focal weakness, Loss of consciousness,  Tremor, insomnia, depression, anxiety, and suicidal ideation.      Objective:  BP 138/78 (BP Location: Left Arm, Patient Position: Sitting, Cuff Size: Normal)    Pulse 90    Temp (!) 97.4 F (36.3 C) (Oral)    Ht 5' 7.5" (1.715 m)    Wt 164 lb 9.6 oz (74.7 kg)    SpO2 98%    BMI 25.40 kg/m   BP Readings from Last 3 Encounters:  04/29/21 138/78  04/16/21 (!) 145/64  04/13/21 (!) 144/78    Wt Readings from Last 3 Encounters:  04/29/21 164 lb 9.6 oz (74.7 kg)  04/16/21 163 lb 9.6 oz (74.2 kg)  04/13/21 163 lb 6.4 oz (74.1 kg)    General appearance: alert, cooperative and appears stated age Ears: normal TM's and external ear canals both ears Throat: lips, mucosa, and tongue normal; teeth and gums normal Neck: no adenopathy, no carotid bruit, supple, symmetrical, trachea midline and thyroid not enlarged, symmetric, no tenderness/mass/nodules  Back: symmetric, no curvature. ROM normal. No CVA tenderness. Lungs: clear to auscultation bilaterally Heart: regular rate and rhythm, S1, S2 normal, no murmur, click, rub or gallop Abdomen: soft, non-tender; bowel sounds normal; no masses,  no organomegaly Pulses: 2+ and symmetric Skin: Skin color, texture, turgor normal. No rashes or lesions Lymph nodes: Cervical, supraclavicular, and axillary nodes normal. Neuro:  awake and interactive with normal mood and affect. Higher cortical functions are normal. Speech is clear without word-finding difficulty or dysarthria. Extraocular  movements are intact. Visual fields of both eyes are grossly intact. Sensation to light touch is grossly intact bilaterally of upper and lower extremities. Motor examination shows 4+/5 symmetric hand grip and upper extremity and 5/5 lower extremity strength. There is no pronation or drift. Gait is non-ataxic   No results found for: HGBA1C  Lab Results  Component Value Date   CREATININE 0.73 04/13/2021   CREATININE 0.80 12/23/2020   CREATININE 0.83 06/24/2020    Lab Results  Component Value Date   WBC 4.7 04/13/2021   HGB 12.9 04/13/2021   HCT 38.3 04/13/2021   PLT 218.0 04/13/2021   GLUCOSE 77 04/13/2021   CHOL 167 06/24/2020   TRIG 124.0 06/24/2020   HDL 71.60 06/24/2020   LDLDIRECT 117.8 06/25/2013   LDLCALC 70 06/24/2020   ALT 28 04/13/2021   AST 34 04/13/2021   NA 139 04/13/2021   K 4.4 04/13/2021   CL 103 04/13/2021   CREATININE 0.73 04/13/2021   BUN 20 04/13/2021   CO2 26 04/13/2021   TSH 3.01 06/24/2020    CT HEAD WO CONTRAST (5MM)  Result Date: 04/13/2021 CLINICAL DATA:  A 80 year old female presents for evaluation of headache and syncope by report. EXAM: CT HEAD WITHOUT CONTRAST TECHNIQUE: Contiguous axial images were obtained from the base of the skull through the vertex without intravenous contrast. COMPARISON:  None FINDINGS: Brain: No evidence of acute infarction, hemorrhage, hydrocephalus, extra-axial collection or mass lesion/mass effect. Signs of atrophy and chronic microvascular ischemic change. Vascular: No hyperdense vessel or unexpected calcification. Skull: Normal. Negative for fracture or focal lesion. Sinuses/Orbits: Visualized paranasal sinuses and orbits show no acute process. Other: None. IMPRESSION: No acute intracranial abnormality. Signs of atrophy and chronic microvascular ischemic change. Electronically Signed   By: Zetta Bills M.D.   On: 04/13/2021 17:31    Assessment & Plan:   Problem List Items Addressed This Visit     Syncope     Isolated episode occurring during handbell choir practice 3 weeks ago: Presumed secondary to orthostatic hypotension in the setting of exhaustion, fasting state and sudden changes in position.  EKG,  Labs , head CT reviewed today       Constipation    Now managed with use of colace daily.  Advised to postpone colonoscopy until after her GYN surgery       Ovarian cyst    Mildly complex,  Incidental finding on CT during workup for new onset constipation.  For BSO end of January by Dr Polly Cobia in [place]       I am having Lori Caldwell "Lori Caldwell" maintain her aspirin, multivitamin, Psyllium (METAMUCIL PO), lactulose, simvastatin, and DSS.  No orders of the defined types were placed in this encounter.    I provided 40 minutes of  face-to-face time during this encounter reviewing patient's current problems and past surgeries, labs and imaging studies, providing counseling on the above mentioned problems , and coordination  of care .   Follow-up: No follow-ups on file.  Crecencio Mc, MD

## 2021-05-06 ENCOUNTER — Ambulatory Visit (INDEPENDENT_AMBULATORY_CARE_PROVIDER_SITE_OTHER): Payer: Medicare Other | Admitting: Cardiovascular Disease

## 2021-05-06 ENCOUNTER — Other Ambulatory Visit: Payer: Self-pay

## 2021-05-06 ENCOUNTER — Encounter: Payer: Self-pay | Admitting: Cardiovascular Disease

## 2021-05-06 VITALS — BP 140/60 | HR 77 | Ht 67.0 in | Wt 166.0 lb

## 2021-05-06 DIAGNOSIS — R55 Syncope and collapse: Secondary | ICD-10-CM | POA: Diagnosis not present

## 2021-05-06 DIAGNOSIS — E782 Mixed hyperlipidemia: Secondary | ICD-10-CM | POA: Diagnosis not present

## 2021-05-06 DIAGNOSIS — Z0000 Encounter for general adult medical examination without abnormal findings: Secondary | ICD-10-CM

## 2021-05-06 DIAGNOSIS — I700 Atherosclerosis of aorta: Secondary | ICD-10-CM

## 2021-05-06 NOTE — Patient Instructions (Signed)
Medication Instructions:  No changes  If you need a refill on your cardiac medications before your next appointment, please call your pharmacy.   Lab work: No new labs needed  Testing/Procedures: No new testing needed  Follow-Up: At [facility], you and your health needs are our priority.  As part of our continuing mission to provide you with exceptional heart care, we have created designated Provider Care Teams.  These Care Teams include your primary Cardiologist (physician) and Advanced Practice Providers (APPs -  Physician Assistants and Nurse Practitioners) who all work together to provide you with the care you need, when you need it.  You will need a follow up appointment as needed  Providers on your designated Care Team:   Christopher Berge, NP Ryan Dunn, PA-C Cadence Furth, PA-C  COVID-19 Vaccine Information can be found at: [email or web address] For questions related to vaccine distribution or appointments, please email [email or web address] or call [phone].    

## 2021-05-06 NOTE — Progress Notes (Signed)
Cardiology Office Note  Date:  05/06/2021   ID:  Lori Caldwell, DOB 12-06-1940, MRN [id number]  PCP:  Crecencio Mc, MD   Chief Complaint  Patient presents with   New Patient (Initial Visit)    Ref by Dr. Derrel Nip referred for abdominal aortic atherosclerosis and dizziness. Medications reviewed by the patient verbally.    HPI:  Lori Caldwell is a 81 year old woman with past medical history of Constipation Hyperlipidemia nonsmoker Who presents by referral from Dr. Derrel Nip for consultation of her aortic atherosclerosis, episode of near syncope/syncope  Notes indicating syncopal event early December 2022, Was playing hand bell choir at church, Had been overextending herself for several weeks prior to event  did not eat lunch that day, that evening had loss of consciousness 1 minute, no seizure activity Went home, did not seek immediate medical attention  Can follow-up with primary care had arrangements for Head CT scan, showing signs of atrophy and chronic microvascular ischemic change. neurology follow-up  On further evaluation, episode felt secondary to orthostasis in the setting of change in position, exhaustion, fasting state  CT scan abdomen August 2022  Images pulled up and reviewed Very mild aortic atherosclerosis noted  Lab work reviewed normal BMP April 13, 2021 Troponin negative, CBC normal Total cholesterol 167 LDL 70  Cyst on surgery 06/02/21 surgery  EKG personally reviewed by myself on todays visit Normal sinus rhythm rate 81 bpm no significant ST or T wave changes  PMH:   has a past medical history of Breast cancer ([place]) (2000), GERD (gastroesophageal reflux disease), Hyperlipidemia, Personal history of chemotherapy (2000), and Personal history of radiation therapy (2000).  PSH:    Past Surgical History:  Procedure Laterality Date   BREAST SURGERY     mastectomy, left breast    MASTECTOMY Right 2000   BREAST CA    Current Outpatient Medications   Medication Sig Dispense Refill   aspirin 81 MG tablet Take 81 mg by mouth daily.     Docusate Sodium (DSS) 100 MG CAPS Take by mouth.     lactulose (CHRONULAC) 10 GM/15ML solution Drink 30 ml every 4 hours until constipation is relieved 236 mL 1   Multiple Vitamin (MULTIVITAMIN) tablet Take 1 tablet by mouth daily.     Psyllium (METAMUCIL PO) Take 2 tablets by mouth 2 (two) times daily.     simvastatin (ZOCOR) 40 MG tablet Take one tablet by mouth every evening 90 tablet 0   No current facility-administered medications for this visit.     Allergies:   Patient has no known allergies.   Social History:  The patient  reports that she has never smoked. She has never used smokeless tobacco. She reports current alcohol use. She reports that she does not use drugs.   Family History:   family history includes Cancer in her maternal grandmother; Heart disease in her father; Hypertension in her mother.    Review of Systems: Review of Systems  Constitutional: Negative.   HENT: Negative.    Respiratory: Negative.    Cardiovascular: Negative.   Gastrointestinal: Negative.   Musculoskeletal: Negative.   Neurological: Negative.   Psychiatric/Behavioral: Negative.    All other systems reviewed and are negative.   PHYSICAL EXAM: VS:  BP 140/60 (BP Location: Left Arm, Patient Position: Sitting, Cuff Size: Normal)    Pulse 77    Ht 5\' 7"  (1.702 m)    Wt 166 lb (75.3 kg)    SpO2 96%    BMI 26.00  kg/m  , BMI Body mass index is 26 kg/m. GEN: Well nourished, well developed, in no acute distress HEENT: normal Neck: no JVD, carotid bruits, or masses Cardiac: RRR; no murmurs, rubs, or gallops,no edema  Respiratory:  clear to auscultation bilaterally, normal work of breathing GI: soft, nontender, nondistended, + BS MS: no deformity or atrophy Skin: warm and dry, no rash Neuro:  Strength and sensation are intact Psych: euthymic mood, full affect  Recent Labs: 06/24/2020: TSH 3.01 04/13/2021:  ALT 28; BUN 20; Creatinine, Ser 0.73; Hemoglobin 12.9; Magnesium 2.0; Platelets 218.0; Potassium 4.4; Sodium 139    Lipid Panel Lab Results  Component Value Date   CHOL 167 06/24/2020   HDL 71.60 06/24/2020   LDLCALC 70 06/24/2020   TRIG 124.0 06/24/2020      Wt Readings from Last 3 Encounters:  05/06/21 166 lb (75.3 kg)  04/29/21 164 lb 9.6 oz (74.7 kg)  04/16/21 163 lb 9.6 oz (74.2 kg)       ASSESSMENT AND PLAN:  Problem List Items Addressed This Visit       Cardiology Problems   Hyperlipidemia   Relevant Orders   EKG 12-Lead     Other   Syncope - Primary   Relevant Orders   EKG 12-Lead   Routine adult health maintenance   Other Visit Diagnoses     Aortic atherosclerosis ([place])       Relevant Orders   EKG 12-Lead      Near-syncope/syncope As detailed above, likely from overextending herself, had not had anything to eat for lunch, may have had mild dehydration, hot, orthostatic No prior episodes and none since that time Normal EKG, appears relatively stable No further work-up ordered at this time Recommend that she contact us for any further near syncope or syncope Suggested she try not to miss meals, that she stay hydrated  Aortic atherosclerosis Very mild disease noted on CT scan August 2022 She prefers not to be on additional cholesterol medication (could add Zetia) Cholesterol numbers are actually reasonable with LDL of 70 on simvastatin  Preop cardiovascular evaluation Scheduled for surgery of cyst end of January 2023 Acceptable risk from cardiac perspective, no further testing needed    Total encounter time more than 60 minutes  Greater than 50% was spent in counseling and coordination of care with the patient    Signed, Esmond Plants, M.D., Ph.D. [place], [place]

## 2021-05-11 ENCOUNTER — Telehealth: Payer: Self-pay | Admitting: Cardiovascular Disease

## 2021-05-11 DIAGNOSIS — R1900 Intra-abdominal and pelvic swelling, mass and lump, unspecified site: Secondary | ICD-10-CM | POA: Insufficient documentation

## 2021-05-11 NOTE — Telephone Encounter (Signed)
Re-faxed ov note giving clearance to requesting office.

## 2021-05-11 NOTE — Telephone Encounter (Signed)
Follow Up:     Vaughan Basta from Dr Shelba Flake office called. She is checking on the status of this patient's clearance. Please fax asap to [phone].

## 2021-05-11 NOTE — Telephone Encounter (Signed)
Preoperative team, this patient was cleared for surgery during their office visit with Dr. Rockey Situ on 05/06/2021.  Please resend cardiac clearance/office note to requesting office.  Thank you.  I will remove patient from preoperative pool.  Jossie Ng. Alexios Keown NP-C    05/11/2021, 10:27 AM [address] Office [phone] Fax [phone]

## 2021-05-19 ENCOUNTER — Ambulatory Visit: Admit: 2021-05-19 | Payer: Medicare Other

## 2021-05-19 SURGERY — COLONOSCOPY
Anesthesia: General

## 2021-06-01 NOTE — Telephone Encounter (Signed)
Patient's spouse called and states Dr. Toma Aran has still not received clearance notes. I faxed note to [facility] office [phone] [facility] office [phone])

## 2021-06-09 DIAGNOSIS — Z01812 Encounter for preprocedural laboratory examination: Secondary | ICD-10-CM | POA: Diagnosis not present

## 2021-06-09 DIAGNOSIS — Z79899 Other long term (current) drug therapy: Secondary | ICD-10-CM | POA: Diagnosis not present

## 2021-06-10 ENCOUNTER — Other Ambulatory Visit: Payer: Self-pay | Admitting: Internal Medicine

## 2021-06-12 DIAGNOSIS — Z01812 Encounter for preprocedural laboratory examination: Secondary | ICD-10-CM | POA: Diagnosis not present

## 2021-06-12 DIAGNOSIS — R1900 Intra-abdominal and pelvic swelling, mass and lump, unspecified site: Secondary | ICD-10-CM | POA: Diagnosis not present

## 2021-06-16 DIAGNOSIS — D271 Benign neoplasm of left ovary: Secondary | ICD-10-CM | POA: Diagnosis not present

## 2021-06-16 DIAGNOSIS — Z7982 Long term (current) use of aspirin: Secondary | ICD-10-CM | POA: Diagnosis not present

## 2021-06-16 DIAGNOSIS — K219 Gastro-esophageal reflux disease without esophagitis: Secondary | ICD-10-CM | POA: Diagnosis not present

## 2021-06-16 DIAGNOSIS — R1900 Intra-abdominal and pelvic swelling, mass and lump, unspecified site: Secondary | ICD-10-CM | POA: Diagnosis not present

## 2021-06-16 DIAGNOSIS — D259 Leiomyoma of uterus, unspecified: Secondary | ICD-10-CM | POA: Diagnosis not present

## 2021-06-16 DIAGNOSIS — Z79899 Other long term (current) drug therapy: Secondary | ICD-10-CM | POA: Diagnosis not present

## 2021-06-16 DIAGNOSIS — Z853 Personal history of malignant neoplasm of breast: Secondary | ICD-10-CM | POA: Diagnosis not present

## 2021-06-16 DIAGNOSIS — E785 Hyperlipidemia, unspecified: Secondary | ICD-10-CM | POA: Diagnosis not present

## 2021-06-16 DIAGNOSIS — N858 Other specified noninflammatory disorders of uterus: Secondary | ICD-10-CM | POA: Diagnosis not present

## 2021-06-16 DIAGNOSIS — Z803 Family history of malignant neoplasm of breast: Secondary | ICD-10-CM | POA: Diagnosis not present

## 2021-06-16 DIAGNOSIS — Z9011 Acquired absence of right breast and nipple: Secondary | ICD-10-CM | POA: Diagnosis not present

## 2021-06-16 DIAGNOSIS — Z791 Long term (current) use of non-steroidal anti-inflammatories (NSAID): Secondary | ICD-10-CM | POA: Diagnosis not present

## 2021-06-16 DIAGNOSIS — D270 Benign neoplasm of right ovary: Secondary | ICD-10-CM | POA: Diagnosis not present

## 2021-06-16 HISTORY — PX: ROBOTIC ASSISTED TOTAL HYSTERECTOMY: SHX6085

## 2021-06-18 DIAGNOSIS — D270 Benign neoplasm of right ovary: Secondary | ICD-10-CM | POA: Diagnosis not present

## 2021-06-18 DIAGNOSIS — D259 Leiomyoma of uterus, unspecified: Secondary | ICD-10-CM | POA: Diagnosis not present

## 2021-06-18 DIAGNOSIS — R8569 Abnormal cytological findings in specimens from other digestive organs and abdominal cavity: Secondary | ICD-10-CM | POA: Diagnosis not present

## 2021-06-18 DIAGNOSIS — D271 Benign neoplasm of left ovary: Secondary | ICD-10-CM | POA: Diagnosis not present

## 2021-07-02 ENCOUNTER — Encounter: Payer: Self-pay | Admitting: Internal Medicine

## 2021-07-02 ENCOUNTER — Ambulatory Visit (INDEPENDENT_AMBULATORY_CARE_PROVIDER_SITE_OTHER): Payer: Medicare Other | Admitting: Internal Medicine

## 2021-07-02 ENCOUNTER — Other Ambulatory Visit: Payer: Self-pay

## 2021-07-02 VITALS — BP 132/78 | HR 82 | Temp 97.5°F | Ht 67.0 in | Wt 164.8 lb

## 2021-07-02 DIAGNOSIS — K5900 Constipation, unspecified: Secondary | ICD-10-CM

## 2021-07-02 DIAGNOSIS — Z1231 Encounter for screening mammogram for malignant neoplasm of breast: Secondary | ICD-10-CM

## 2021-07-02 DIAGNOSIS — Z87898 Personal history of other specified conditions: Secondary | ICD-10-CM | POA: Diagnosis not present

## 2021-07-02 DIAGNOSIS — N83201 Unspecified ovarian cyst, right side: Secondary | ICD-10-CM | POA: Diagnosis not present

## 2021-07-02 DIAGNOSIS — R5383 Other fatigue: Secondary | ICD-10-CM | POA: Diagnosis not present

## 2021-07-02 DIAGNOSIS — E782 Mixed hyperlipidemia: Secondary | ICD-10-CM | POA: Diagnosis not present

## 2021-07-02 DIAGNOSIS — Z1211 Encounter for screening for malignant neoplasm of colon: Secondary | ICD-10-CM | POA: Diagnosis not present

## 2021-07-02 MED ORDER — SIMVASTATIN 40 MG PO TABS: 40.0000 mg | ORAL_TABLET | Freq: Every evening | ORAL | 3 refills | Status: DC

## 2021-07-02 NOTE — Progress Notes (Signed)
? ?Subjective:  ?Patient ID: Lori Caldwell, female    DOB: [birth year]  Age: 81 y.o. MRN: [id number] ? ?CC: The primary encounter diagnosis was Mixed hyperlipidemia. Diagnoses of Other fatigue, Encounter for screening mammogram for malignant neoplasm of breast, Constipation, unspecified constipation type, Colon cancer screening, Cyst of right ovary, and History of syncope were also pertinent to this visit. ? ? ?This visit occurred during the SARS-CoV-2 public health emergency.  Safety protocols were in place, including screening questions prior to the visit, additional usage of staff PPE, and extensive cleaning of exam room while observing appropriate contact time as indicated for disinfecting solutions.   ? ?HPI ?Lori Caldwell presents for follow up  ?Chief Complaint  ?Patient presents with  ? Follow-up  ?  Yearly follow up   ? ?1) s/p robotic hysterectomy BSO  on Feb 14  for removal of complex ovarian cyst.by Dr Lori Caldwell at [facility].   all path reports benign : uterine leiyomyoma and bilateral serous cystadenofibromas . Has not had post operative follow up,  just my chart messages!  No fevers,  purulent drainage or vaginal bleeding. ? ? ?2) nocturnal urge incontinence new sine surgery.  Not ocurring during the day ? ?3) constipation:  regularity is  improving with use of colace nightly and prn miraralax ? ?4) due for unilateral mammogram  ? ?5) reviewed recent cardiology evaluation for syncope.  ? ? ?Outpatient Medications Prior to Visit  ?Medication Sig Dispense Refill  ? ascorbic acid (VITAMIN C) 100 MG tablet Take by mouth.    ? aspirin 81 MG tablet Take 81 mg by mouth daily.    ? Cholecalciferol 25 MCG (1000 UT) tablet Take by mouth.    ? Docusate Sodium (DSS) 100 MG CAPS Take by mouth.    ? Multiple Vitamin (MULTIVITAMIN) tablet Take 1 tablet by mouth daily.    ? simvastatin (ZOCOR) 40 MG tablet Take one tablet by mouth every evening 90 tablet 0  ? lactulose (CHRONULAC) 10 GM/15ML solution Drink 30 ml every  4 hours until constipation is relieved (Patient not taking: Reported on 07/02/2021) 236 mL 1  ? Psyllium (METAMUCIL PO) Take 2 tablets by mouth 2 (two) times daily. (Patient not taking: Reported on 07/02/2021)    ? ?No facility-administered medications prior to visit.  ? ? ?Review of Systems; ? ?Patient denies headache, fevers, malaise, unintentional weight loss, skin rash, eye pain, sinus congestion and sinus pain, sore throat, dysphagia,  hemoptysis , cough, dyspnea, wheezing, chest pain, palpitations, orthopnea, edema, abdominal pain, nausea, melena, diarrhea, constipation, flank pain, dysuria, hematuria, urinary  Frequency, nocturia, numbness, tingling, seizures,  Focal weakness, Loss of consciousness,  Tremor, insomnia, depression, anxiety, and suicidal ideation.   ? ? ? ?Objective:  ?BP 132/78 (BP Location: Left Arm, Patient Position: Sitting, Cuff Size: Normal)   Pulse 82   Temp (!) 97.5 ?F (36.4 ?C) (Oral)   Ht '5\' 7"'  (1.702 m)   Wt 164 lb 12.8 oz (74.8 kg)   SpO2 99%   BMI 25.81 kg/m?  ? ?BP Readings from Last 3 Encounters:  ?07/02/21 132/78  ?05/06/21 140/60  ?04/29/21 138/78  ? ? ?Wt Readings from Last 3 Encounters:  ?07/02/21 164 lb 12.8 oz (74.8 kg)  ?05/06/21 166 lb (75.3 kg)  ?04/29/21 164 lb 9.6 oz (74.7 kg)  ? ? ?General appearance: alert, cooperative and appears stated age ?Ears: normal TM's and external ear canals both ears ?Throat: lips, mucosa, and tongue normal; teeth and gums normal ?Neck: no  adenopathy, no carotid bruit, supple, symmetrical, trachea midline and thyroid not enlarged, symmetric, no tenderness/mass/nodules ?Back: symmetric, no curvature. ROM normal. No CVA tenderness. ?Lungs: clear to auscultation bilaterally ?Heart: regular rate and rhythm, S1, S2 normal, no murmur, click, rub or gallop ?Abdomen: soft, non-tender; bowel sounds normal; no masses,  no organomegaly ?Pulses: 2+ and symmetric ?Skin: Skin color, texture, turgor normal. No rashes or lesions ?Lymph nodes: Cervical,  supraclavicular, and axillary nodes normal. ? ?No results found for: HGBA1C ? ?Lab Results  ?Component Value Date  ? CREATININE 0.73 04/13/2021  ? CREATININE 0.80 12/23/2020  ? CREATININE 0.83 06/24/2020  ? ? ?Lab Results  ?Component Value Date  ? WBC 4.7 04/13/2021  ? HGB 12.9 04/13/2021  ? HCT 38.3 04/13/2021  ? PLT 218.0 04/13/2021  ? GLUCOSE 77 04/13/2021  ? CHOL 167 06/24/2020  ? TRIG 124.0 06/24/2020  ? HDL 71.60 06/24/2020  ? LDLDIRECT 117.8 06/25/2013  ? Provencal 70 06/24/2020  ? ALT 28 04/13/2021  ? AST 34 04/13/2021  ? NA 139 04/13/2021  ? K 4.4 04/13/2021  ? CL 103 04/13/2021  ? CREATININE 0.73 04/13/2021  ? BUN 20 04/13/2021  ? CO2 26 04/13/2021  ? TSH 3.01 06/24/2020  ? ? ?CT HEAD WO CONTRAST (5MM) ? ?Result Date: 04/13/2021 ?CLINICAL DATA:  A 81 year old female presents for evaluation of headache and syncope by report. EXAM: CT HEAD WITHOUT CONTRAST TECHNIQUE: Contiguous axial images were obtained from the base of the skull through the vertex without intravenous contrast. COMPARISON:  None FINDINGS: Brain: No evidence of acute infarction, hemorrhage, hydrocephalus, extra-axial collection or mass lesion/mass effect. Signs of atrophy and chronic microvascular ischemic change. Vascular: No hyperdense vessel or unexpected calcification. Skull: Normal. Negative for fracture or focal lesion. Sinuses/Orbits: Visualized paranasal sinuses and orbits show no acute process. Other: None. IMPRESSION: No acute intracranial abnormality. Signs of atrophy and chronic microvascular ischemic change. Electronically Signed   By: Lori Caldwell M.D.   On: 04/13/2021 17:31  ? ? ?Assessment & Plan:  ? ?Problem List Items Addressed This Visit   ? ? Colon cancer screening  ?  She is overdue for colon ca screening.   Colonoscopy has been postponed due to GYN surgery  ?  ?  ? Constipation  ?  Improving with use of colace.  ?  ?  ? History of syncope  ?  Attributed to overexertion mild dehydration, leading to probable orthostatic  episode.  Seen by cardiology, no further workup needed  ?  ?  ? Hyperlipidemia - Primary  ? Relevant Medications  ? simvastatin (ZOCOR) 40 MG tablet  ? Other Relevant Orders  ? Comp Met (CMET)  ? Lipid Profile  ? Ovarian cyst  ?  Benign , s/p TAH/BSO Feb 2023 ?  ?  ? ?Other Visit Diagnoses   ? ? Other fatigue      ? Relevant Orders  ? TSH  ? Encounter for screening mammogram for malignant neoplasm of breast      ? Relevant Orders  ? MM 3D SCREEN BREAST UNI LEFT  ? ?  ? ? ?I spent 30 minutes dedicated to the care of this patient on the date of this encounter to include pre-visit review of patient's medical history,  most recent imaging studies, Face-to-face time with the patient , and post visit ordering of testing and therapeutics.   ? ?Follow-up: No follow-ups on file. ? ? ?Crecencio Mc, MD ?

## 2021-07-02 NOTE — Patient Instructions (Signed)
Your "Steri strips"  are your current bandages.  They will fall off when the underlying wound has resolved )on their own) ? ?You should purchase a "body bolster" (pillow that is the length of your torso "and then some") to help your rest more comfortably ? ? ?Repeat fasting labs in June ,and OV to follow ? ? ?Limit water intake after 7 pm.  , no caffeine after 1 pm ?

## 2021-07-04 NOTE — Assessment & Plan Note (Addendum)
Improving with use of colace.  ?

## 2021-07-04 NOTE — Assessment & Plan Note (Signed)
Benign , s/p TAH/BSO Feb 2023 ?

## 2021-07-04 NOTE — Assessment & Plan Note (Signed)
Attributed to overexertion mild dehydration, leading to probable orthostatic episode.  Seen by cardiology, no further workup needed  ?

## 2021-07-04 NOTE — Assessment & Plan Note (Addendum)
She is overdue for colon ca screening.   Colonoscopy has been postponed due to GYN surgery  ?

## 2021-07-23 DIAGNOSIS — L821 Other seborrheic keratosis: Secondary | ICD-10-CM | POA: Diagnosis not present

## 2021-07-23 DIAGNOSIS — Z1283 Encounter for screening for malignant neoplasm of skin: Secondary | ICD-10-CM | POA: Diagnosis not present

## 2021-07-23 DIAGNOSIS — R238 Other skin changes: Secondary | ICD-10-CM | POA: Diagnosis not present

## 2021-07-23 DIAGNOSIS — Z85828 Personal history of other malignant neoplasm of skin: Secondary | ICD-10-CM | POA: Diagnosis not present

## 2021-07-23 DIAGNOSIS — D1801 Hemangioma of skin and subcutaneous tissue: Secondary | ICD-10-CM | POA: Diagnosis not present

## 2021-07-23 DIAGNOSIS — D225 Melanocytic nevi of trunk: Secondary | ICD-10-CM | POA: Diagnosis not present

## 2021-07-23 DIAGNOSIS — L570 Actinic keratosis: Secondary | ICD-10-CM | POA: Diagnosis not present

## 2021-07-23 DIAGNOSIS — L720 Epidermal cyst: Secondary | ICD-10-CM | POA: Diagnosis not present

## 2021-07-23 DIAGNOSIS — L812 Freckles: Secondary | ICD-10-CM | POA: Diagnosis not present

## 2021-08-21 ENCOUNTER — Encounter: Payer: Self-pay | Admitting: Internal Medicine

## 2021-08-27 DIAGNOSIS — R1900 Intra-abdominal and pelvic swelling, mass and lump, unspecified site: Secondary | ICD-10-CM | POA: Diagnosis not present

## 2021-09-16 DIAGNOSIS — H6123 Impacted cerumen, bilateral: Secondary | ICD-10-CM | POA: Diagnosis not present

## 2021-09-16 DIAGNOSIS — H903 Sensorineural hearing loss, bilateral: Secondary | ICD-10-CM | POA: Diagnosis not present

## 2021-09-18 ENCOUNTER — Telehealth: Payer: Self-pay | Admitting: Internal Medicine

## 2021-09-18 NOTE — Telephone Encounter (Signed)
Copied from [place] [phone]. Topic: Medicare AWV >> Sep 18, 2021  9:36 AM Harris-Coley, Hannah Beat wrote: Reason for CRM: Left message for patient to schedule Annual Wellness Visit.  Please schedule with Nurse Health Advisor Denisa O'Brien-Blaney, LPN at [facility].  Please call [phone] ask for [facility]

## 2021-10-01 ENCOUNTER — Ambulatory Visit
Admission: RE | Admit: 2021-10-01 | Discharge: 2021-10-01 | Disposition: A | Discharge status: Home / Self Care | Payer: Medicare Other | Source: Ambulatory Visit | Attending: Internal Medicine | Admitting: Internal Medicine

## 2021-10-01 DIAGNOSIS — Z1231 Encounter for screening mammogram for malignant neoplasm of breast: Secondary | ICD-10-CM | POA: Diagnosis not present

## 2021-10-09 ENCOUNTER — Other Ambulatory Visit (INDEPENDENT_AMBULATORY_CARE_PROVIDER_SITE_OTHER): Payer: Medicare Other

## 2021-10-09 DIAGNOSIS — R5383 Other fatigue: Secondary | ICD-10-CM | POA: Diagnosis not present

## 2021-10-09 DIAGNOSIS — E782 Mixed hyperlipidemia: Secondary | ICD-10-CM

## 2021-10-09 LAB — COMPREHENSIVE METABOLIC PANEL
ALT: 18 U/L (ref 0–35)
AST: 23 U/L (ref 0–37)
Albumin: 4.3 g/dL (ref 3.5–5.2)
Alkaline Phosphatase: 59 U/L (ref 39–117)
BUN: 14 mg/dL (ref 6–23)
CO2: 27 mEq/L (ref 19–32)
Calcium: 9.5 mg/dL (ref 8.4–10.5)
Chloride: 103 mEq/L (ref 96–112)
Creatinine, Ser: 0.74 mg/dL (ref 0.40–1.20)
GFR: 76.65 mL/min (ref >60.00)
Glucose, Bld: 81 mg/dL (ref 70–99)
Potassium: 3.9 mEq/L (ref 3.5–5.1)
Sodium: 141 mEq/L (ref 135–145)
Total Bilirubin: 0.7 mg/dL (ref 0.2–1.2)
Total Protein: 7 g/dL (ref 6.0–8.3)

## 2021-10-09 LAB — LIPID PANEL
Cholesterol: 182 mg/dL (ref 0–200)
HDL: 72.5 mg/dL (ref >39.00)
LDL Cholesterol: 78 mg/dL (ref 0–99)
NonHDL: 109.28
Total CHOL/HDL Ratio: 3
Triglycerides: 154 mg/dL — ABNORMAL HIGH (ref 0.0–149.0)
VLDL: 30.8 mg/dL (ref 0.0–40.0)

## 2021-10-09 LAB — TSH: TSH: 2.78 u[IU]/mL (ref 0.35–5.50)

## 2021-10-14 ENCOUNTER — Encounter: Payer: Self-pay | Admitting: Internal Medicine

## 2021-10-14 ENCOUNTER — Ambulatory Visit (INDEPENDENT_AMBULATORY_CARE_PROVIDER_SITE_OTHER): Payer: Medicare Other | Admitting: Internal Medicine

## 2021-10-14 VITALS — BP 120/72 | HR 89 | Temp 97.5°F | Ht 67.0 in | Wt 163.2 lb

## 2021-10-14 DIAGNOSIS — N83201 Unspecified ovarian cyst, right side: Secondary | ICD-10-CM

## 2021-10-14 DIAGNOSIS — Z1211 Encounter for screening for malignant neoplasm of colon: Secondary | ICD-10-CM | POA: Diagnosis not present

## 2021-10-14 DIAGNOSIS — M8580 Other specified disorders of bone density and structure, unspecified site: Secondary | ICD-10-CM

## 2021-10-14 DIAGNOSIS — E782 Mixed hyperlipidemia: Secondary | ICD-10-CM | POA: Diagnosis not present

## 2021-10-14 MED ORDER — HYDROCORTISONE 2.5 % EX CREA
TOPICAL_CREAM | Freq: Two times a day (BID) | CUTANEOUS | 5 refills | Status: DC
Start: 2021-10-14 — End: 2022-06-23

## 2021-10-14 NOTE — Progress Notes (Signed)
Subjective:  Patient ID: Lori Caldwell, female    DOB: 1940/05/23  Age: 81 y.o. MRN: [id number]  CC: The primary encounter diagnosis was Colon cancer screening. 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   HPI Lori Caldwell presents for  Chief Complaint  Patient presents with   Follow-up    Follow up on hyperlipidemia   1) Hyperlipidemia:  tolerating simvastatin without muscle pain and weakness    2) s/p TAH/BSO In Feb 2023:  She has fully recovered from her hysterectomy in March    Outpatient Medications Prior to Visit  Medication Sig Dispense Refill   ascorbic acid (VITAMIN C) 100 MG tablet Take by mouth.     aspirin 81 MG tablet Take 81 mg by mouth daily.     Cholecalciferol 25 MCG (1000 UT) tablet Take by mouth.     Docusate Sodium (DSS) 100 MG CAPS Take by mouth.     Multiple Vitamin (MULTIVITAMIN) tablet Take 1 tablet by mouth daily.     simvastatin (ZOCOR) 40 MG tablet Take 1 tablet (40 mg total) by mouth every evening. 90 tablet 3   lactulose (CHRONULAC) 10 GM/15ML solution Drink 30 ml every 4 hours until constipation is relieved (Patient not taking: Reported on 07/02/2021) 236 mL 1   No facility-administered medications prior to visit.    Review of Systems;  Patient denies headache, fevers, malaise, unintentional weight loss, skin rash, eye pain, sinus congestion and sinus pain, sore throat, dysphagia,  hemoptysis , cough, dyspnea, wheezing, chest pain, palpitations, orthopnea, edema, abdominal pain, nausea, melena, diarrhea, constipation, flank pain, dysuria, hematuria, urinary  Frequency, nocturia, numbness, tingling, seizures,  Focal weakness, Loss of consciousness,  Tremor, insomnia, depression, anxiety, and suicidal ideation.      Objective:  BP 120/72 (BP Location: Left Arm, Patient Position: Sitting, Cuff Size: Normal)   Pulse 89   Temp (!) 97.5 F (36.4 C) (Oral)   Ht  '5\' 7"'$  (1.702 m)   Wt 163 lb 3.2 oz (74 kg)   SpO2 99%   BMI 25.56 kg/m   BP Readings from Last 3 Encounters:  10/14/21 120/72  07/02/21 132/78  05/06/21 140/60    Wt Readings from Last 3 Encounters:  10/14/21 163 lb 3.2 oz (74 kg)  07/02/21 164 lb 12.8 oz (74.8 kg)  05/06/21 166 lb (75.3 kg)    General appearance: alert, cooperative and appears stated age Ears: normal TM's and external ear canals both ears Throat: lips, mucosa, and tongue normal; teeth and gums normal Neck: no adenopathy, no carotid bruit, supple, symmetrical, trachea midline and thyroid not enlarged, symmetric, no tenderness/mass/nodules Back: symmetric, no curvature. ROM normal. No CVA tenderness. Lungs: clear to auscultation bilaterally Heart: regular rate and rhythm, S1, S2 normal, no murmur, click, rub or gallop Abdomen: soft, non-tender; bowel sounds normal; no masses,  no organomegaly Pulses: 2+ and symmetric Skin: Skin color, texture, turgor normal. No rashes or lesions Lymph nodes: Cervical, supraclavicular, and axillary nodes normal.  No results found for: "HGBA1C"  Lab Results  Component Value Date   CREATININE 0.74 10/09/2021   CREATININE 0.73 04/13/2021   CREATININE 0.80 12/23/2020    Lab Results  Component Value Date   WBC 4.7 04/13/2021   HGB 12.9 04/13/2021   HCT 38.3 04/13/2021   PLT 218.0 04/13/2021   GLUCOSE 81 10/09/2021   CHOL 182 10/09/2021   TRIG 154.0 (H) 10/09/2021   HDL  72.50 10/09/2021   LDLDIRECT 117.8 06/25/2013   LDLCALC 78 10/09/2021   ALT 18 10/09/2021   AST 23 10/09/2021   NA 141 10/09/2021   K 3.9 10/09/2021   CL 103 10/09/2021   CREATININE 0.74 10/09/2021   BUN 14 10/09/2021   CO2 27 10/09/2021   TSH 2.78 10/09/2021    MM 3D SCREEN BREAST UNI LEFT  Result Date: 10/02/2021 CLINICAL DATA:  Screening. EXAM: DIGITAL SCREENING UNILATERAL LEFT MAMMOGRAM WITH CAD AND TOMOSYNTHESIS TECHNIQUE: Left screening digital craniocaudal and mediolateral oblique  mammograms were obtained. Left screening digital breast tomosynthesis was performed. The images were evaluated with computer-aided detection. COMPARISON:  Previous exam(s). ACR Breast Density Category b: There are scattered areas of fibroglandular density. FINDINGS: The patient has had a right mastectomy. There are no findings suspicious for malignancy. IMPRESSION: No mammographic evidence of malignancy. A result letter of this screening mammogram will be mailed directly to the patient. RECOMMENDATION: Screening mammogram in one year.  (Code:SM-L-32M) BI-RADS CATEGORY  1: Negative. Electronically Signed   By: Fidela Salisbury M.D.   On: 10/02/2021 15:21    Assessment & Plan:   Problem List Items Addressed This Visit     Ovarian cyst    Benign , s/p TAH/BSO Feb 2023. She has fully  Recovered from her surgery       Osteopenia due to cancer therapy    Reviewed results of DEXA done in  Sept  2021.  She has mild to moderate osteopenia by recent DEXA  With slight progression at the femur. .  I recommend continued daily participation in weight bearing exercise activities , striving to get  the majority of your calcium needs (1200 mg )   through dietary sources rather than supplements ,  and taking a vitamin D supplement (2000 Ius daily for the winter months unless otherwise directed ) . Repeat DEXA this fall .           Hyperlipidemia    Managed with zocor.  LDL and triglycerides were  at goal on current medication s .  LFTs are normal.     Lab Results  Component Value Date   CHOL 182 10/09/2021   HDL 72.50 10/09/2021   LDLCALC 78 10/09/2021   LDLDIRECT 117.8 06/25/2013   TRIG 154.0 (H) 10/09/2021   CHOLHDL 3 10/09/2021         Colon cancer screening - Primary   Relevant Orders   Cologuard    I spent a total of   minutes with this patient in a face to face visit on the date of this encounter reviewing the last office visit with me on        ,  most recent with patient's cardiologist  in    ,  patient'ss diet and eating habits, home blood pressure readings ,  most recent imaging study ,   and post visit ordering of testing and therapeutics.    Follow-up: Return in about 6 months (around 04/15/2022).   Crecencio Mc, MD

## 2021-10-14 NOTE — Patient Instructions (Addendum)
Happy Happy Rudene Anda!   I will do your pelvic exam next year (April)  Let's continue doing the Cologuard test for colon Ca screening until you are 37  Try doing Kegel exercises  to strengthen your control of your bladder:

## 2021-10-15 NOTE — Assessment & Plan Note (Signed)
Reviewed results of DEXA done in  Sept  2021.  She has mild to moderate osteopenia by recent DEXA  With slight progression at the femur. .  I recommend continued daily participation in weight bearing exercise activities , striving to get  the majority of your calcium needs (1200 mg )   through dietary sources rather than supplements ,  and taking a vitamin D supplement (2000 Ius daily for the winter months unless otherwise directed ) . Repeat DEXA this fall .

## 2021-10-15 NOTE — Assessment & Plan Note (Signed)
Benign , s/p TAH/BSO Feb 2023. She has fully  Recovered from her surgery

## 2021-10-15 NOTE — Assessment & Plan Note (Signed)
Managed with zocor.  LDL and triglycerides were  at goal on current medication s .  LFTs are normal.     Lab Results  Component Value Date   CHOL 182 10/09/2021   HDL 72.50 10/09/2021   LDLCALC 78 10/09/2021   LDLDIRECT 117.8 06/25/2013   TRIG 154.0 (H) 10/09/2021   CHOLHDL 3 10/09/2021    

## 2021-10-30 ENCOUNTER — Telehealth: Payer: Self-pay | Admitting: Internal Medicine

## 2021-10-30 NOTE — Telephone Encounter (Signed)
Copied from [place] [phone]. Topic: Medicare AWV >> Oct 30, 2021 10:02 AM Devoria Glassing wrote: Reason for CRM: Called patient to schedule Annual Wellness Visit.  Please schedule with Nurse Health Advisor Denisa O'Brien-Blaney, LPN at [facility].  Please call [phone] ask for Juliann Pulse. No answer both numbers.

## 2021-11-19 ENCOUNTER — Telehealth: Payer: Self-pay | Admitting: Internal Medicine

## 2021-11-19 NOTE — Telephone Encounter (Signed)
Spoke with patient she req CB 12/07/21 to sched AWV

## 2021-11-19 NOTE — Telephone Encounter (Signed)
Copied from [place] [phone]. Topic: Medicare AWV >> Nov 19, 2021 10:39 AM Devoria Glassing wrote: Reason for CRM: Called patient to schedule Annual Wellness Visit.  Please schedule with Nurse Health Advisor Denisa O'Brien-Blaney, LPN at [facility]. This appt can be telephone or office visit.  Please call [phone] ask for [facility]

## 2021-11-30 DIAGNOSIS — L03116 Cellulitis of left lower limb: Secondary | ICD-10-CM | POA: Diagnosis not present

## 2021-12-02 ENCOUNTER — Telehealth: Payer: Self-pay | Admitting: Internal Medicine

## 2021-12-02 NOTE — Telephone Encounter (Signed)
Copied from [place] [phone]. Topic: Medicare AWV >> Dec 02, 2021 10:36 AM Devoria Glassing wrote: Reason for CRM: Left message for patient to schedule Annual Wellness Visit.  Please schedule with Nurse Health Advisor Denisa O'Brien-Blaney, LPN at [facility]. This appt can be telephone or office visit.  Please call [phone] ask for [facility]

## 2021-12-10 DIAGNOSIS — L578 Other skin changes due to chronic exposure to nonionizing radiation: Secondary | ICD-10-CM | POA: Diagnosis not present

## 2021-12-10 DIAGNOSIS — L03116 Cellulitis of left lower limb: Secondary | ICD-10-CM | POA: Diagnosis not present

## 2021-12-10 DIAGNOSIS — L97901 Non-pressure chronic ulcer of unspecified part of unspecified lower leg limited to breakdown of skin: Secondary | ICD-10-CM | POA: Diagnosis not present

## 2021-12-10 DIAGNOSIS — R52 Pain, unspecified: Secondary | ICD-10-CM | POA: Diagnosis not present

## 2021-12-10 DIAGNOSIS — Z85828 Personal history of other malignant neoplasm of skin: Secondary | ICD-10-CM | POA: Diagnosis not present

## 2021-12-13 ENCOUNTER — Encounter: Payer: Self-pay | Admitting: Internal Medicine

## 2021-12-15 DIAGNOSIS — L03116 Cellulitis of left lower limb: Secondary | ICD-10-CM | POA: Diagnosis not present

## 2021-12-15 DIAGNOSIS — R52 Pain, unspecified: Secondary | ICD-10-CM | POA: Diagnosis not present

## 2021-12-16 NOTE — Telephone Encounter (Signed)
Pt has been scheduled for tomorrow at 11 am.

## 2021-12-17 ENCOUNTER — Ambulatory Visit (INDEPENDENT_AMBULATORY_CARE_PROVIDER_SITE_OTHER): Payer: Medicare Other | Admitting: Internal Medicine

## 2021-12-17 ENCOUNTER — Encounter: Payer: Self-pay | Admitting: Internal Medicine

## 2021-12-17 DIAGNOSIS — L03119 Cellulitis of unspecified part of limb: Secondary | ICD-10-CM

## 2021-12-17 DIAGNOSIS — K5900 Constipation, unspecified: Secondary | ICD-10-CM | POA: Diagnosis not present

## 2021-12-17 NOTE — Patient Instructions (Addendum)
Taking an antibiotic can create an imbalance in the normal population of bacteria that live in the small intestine.  This imbalance can persist for 3 months.   Taking a probiotic ( Align, Floraque or Culturelle), for a minimum of 3 weeks   is recommended while you are on antibiotics  to prevent a serious antibiotic associated diarrhea  Called clostridium dificile colitis that occurs when the bacteria population is altered .  Taking a probiotic may also prevent vaginitis due to yeast infections and can be continued indefinitely if you feel that it improves your digestion or your elimination (bowels).      For the constipation:  Try adding Benefiber DAILY in your favorite beverage.   separate from YOUR pills by 2 hours.   I PREFER  benefiber  OVER MIRALAX AND CITRUCEL because it contains prebiotics(WHICH ARE HEALTHY FOR THE GUT)  OK TO CONTINUE COLACE AT NIGHT

## 2021-12-17 NOTE — Progress Notes (Signed)
Patient thinks antibiotic may be contributing to the constipation. Dulcolax is the only thing that is helping?

## 2021-12-17 NOTE — Progress Notes (Signed)
Subjective:  Patient ID: Lori Caldwell, female    DOB: 25-Jan-1941  Age: 81 y.o. MRN: [id number]  CC: Diagnoses of Constipation, unspecified constipation type and Cellulitis of leg without foot were pertinent to this visit.   HPI Lori Caldwell presents for follow up on persistent constipation Chief Complaint  Patient presents with   Constipation    1) Constipation:  chronic,  previously managed with daily use of docusate pre gyn surgery, now moving bowels daily ,  using dulcolax not more than twice a week .  Has been having fewer movements since starting doxycycline and sleepier during the day  2) RLE wound infection /cellulitis currently treated with doxycycline by dermatologist for the past 7 days .    Outpatient Medications Prior to Visit  Medication Sig Dispense Refill   ascorbic acid (VITAMIN C) 100 MG tablet Take by mouth.     aspirin 81 MG tablet Take 81 mg by mouth daily.     Cholecalciferol 25 MCG (1000 UT) tablet Take by mouth.     Docusate Sodium (DSS) 100 MG CAPS Take by mouth.     hydrocortisone 2.5 % cream Apply topically 2 (two) times daily. 28 g 5   Multiple Vitamin (MULTIVITAMIN) tablet Take 1 tablet by mouth daily.     simvastatin (ZOCOR) 40 MG tablet Take 1 tablet (40 mg total) by mouth every evening. 90 tablet 3   No facility-administered medications prior to visit.    Review of Systems;  Patient denies headache, fevers, malaise, unintentional weight loss, skin rash, eye pain, sinus congestion and sinus pain, sore throat, dysphagia,  hemoptysis , cough, dyspnea, wheezing, chest pain, palpitations, orthopnea, edema, abdominal pain, nausea, melena, diarrhea, constipation, flank pain, dysuria, hematuria, urinary  Frequency, nocturia, numbness, tingling, seizures,  Focal weakness, Loss of consciousness,  Tremor, insomnia, depression, anxiety, and suicidal ideation.      Objective:  BP 118/70 (BP Location: Left Arm, Patient Position: Sitting, Cuff Size:  Normal)   Pulse (!) 43   Temp (!) 97.4 F (36.3 C) (Oral)   Ht '5\' 7"'$  (1.702 m)   Wt 162 lb (73.5 kg)   SpO2 98%   BMI 25.37 kg/m   BP Readings from Last 3 Encounters:  12/17/21 118/70  10/14/21 120/72  07/02/21 132/78    Wt Readings from Last 3 Encounters:  12/17/21 162 lb (73.5 kg)  10/14/21 163 lb 3.2 oz (74 kg)  07/02/21 164 lb 12.8 oz (74.8 kg)    General appearance: alert, cooperative and appears stated age Ears: normal TM's and external ear canals both ears Throat: lips, mucosa, and tongue normal; teeth and gums normal Neck: no adenopathy, no carotid bruit, supple, symmetrical, trachea midline and thyroid not enlarged, symmetric, no tenderness/mass/nodules Back: symmetric, no curvature. ROM normal. No CVA tenderness. Lungs: clear to auscultation bilaterally Heart: regular rate and rhythm, S1, S2 normal, no murmur, click, rub or gallop Abdomen: soft, non-tender; bowel sounds normal; no masses,  no organomegaly Pulses: 2+ and symmetric Skin: Skin color, texture, turgor normal. No rashes or lesions. Rigtht  SHIN WOUND WITHOUT ERYTHEM Lymph nodes: Cervical, supraclavicular, and axillary nodes normal.  No results found for: "HGBA1C"  Lab Results  Component Value Date   CREATININE 0.74 10/09/2021   CREATININE 0.73 04/13/2021   CREATININE 0.80 12/23/2020    Lab Results  Component Value Date   WBC 4.7 04/13/2021   HGB 12.9 04/13/2021   HCT 38.3 04/13/2021   PLT 218.0 04/13/2021   GLUCOSE 81 10/09/2021  CHOL 182 10/09/2021   TRIG 154.0 (H) 10/09/2021   HDL 72.50 10/09/2021   LDLDIRECT 117.8 06/25/2013   LDLCALC 78 10/09/2021   ALT 18 10/09/2021   AST 23 10/09/2021   NA 141 10/09/2021   K 3.9 10/09/2021   CL 103 10/09/2021   CREATININE 0.74 10/09/2021   BUN 14 10/09/2021   CO2 27 10/09/2021   TSH 2.78 10/09/2021    MM 3D SCREEN BREAST UNI LEFT  Result Date: 10/02/2021 CLINICAL DATA:  Screening. EXAM: DIGITAL SCREENING UNILATERAL LEFT MAMMOGRAM WITH CAD  AND TOMOSYNTHESIS TECHNIQUE: Left screening digital craniocaudal and mediolateral oblique mammograms were obtained. Left screening digital breast tomosynthesis was performed. The images were evaluated with computer-aided detection. COMPARISON:  Previous exam(s). ACR Breast Density Category b: There are scattered areas of fibroglandular density. FINDINGS: The patient has had a right mastectomy. There are no findings suspicious for malignancy. IMPRESSION: No mammographic evidence of malignancy. A result letter of this screening mammogram will be mailed directly to the patient. RECOMMENDATION: Screening mammogram in one year.  (Code:SM-L-95M) BI-RADS CATEGORY  1: Negative. Electronically Signed   By: Fidela Salisbury M.D.   On: 10/02/2021 15:21    Assessment & Plan:   Problem List Items Addressed This Visit     Cellulitis of leg without foot    No signs of ongoing cellulitis on today's exam..  Still taking antibiotic.  Probiotic use advised       Constipation    Improving with use of colace. Requiring stimulant  Laxative  Up to 2 times per week .  Recommend trial of Benefiber.         Follow-up: Return in about 6 months (around 06/19/2022).   Crecencio Mc, MD

## 2021-12-19 DIAGNOSIS — L03119 Cellulitis of unspecified part of limb: Secondary | ICD-10-CM | POA: Insufficient documentation

## 2021-12-19 NOTE — Assessment & Plan Note (Signed)
No signs of ongoing cellulitis on today's exam..  Still taking antibiotic.  Probiotic use advised

## 2021-12-19 NOTE — Assessment & Plan Note (Signed)
Improving with use of colace. Requiring stimulant  Laxative  Up to 2 times per week .  Recommend trial of Benefiber.

## 2021-12-22 ENCOUNTER — Telehealth: Payer: Medicare Other | Admitting: Internal Medicine

## 2021-12-31 DIAGNOSIS — Z1211 Encounter for screening for malignant neoplasm of colon: Secondary | ICD-10-CM | POA: Diagnosis not present

## 2022-01-07 LAB — COLOGUARD: COLOGUARD: POSITIVE — AB

## 2022-01-10 ENCOUNTER — Encounter: Payer: Self-pay | Admitting: Internal Medicine

## 2022-01-10 DIAGNOSIS — Z1211 Encounter for screening for malignant neoplasm of colon: Secondary | ICD-10-CM

## 2022-01-10 DIAGNOSIS — R195 Other fecal abnormalities: Secondary | ICD-10-CM

## 2022-01-11 ENCOUNTER — Other Ambulatory Visit: Payer: Self-pay

## 2022-01-11 DIAGNOSIS — R195 Other fecal abnormalities: Secondary | ICD-10-CM

## 2022-01-11 NOTE — Telephone Encounter (Signed)
Referral has been pended for approval. 

## 2022-01-12 ENCOUNTER — Telehealth: Payer: Self-pay | Admitting: Internal Medicine

## 2022-01-12 NOTE — Telephone Encounter (Signed)
Spoke with patient she req a call back this afternoon

## 2022-01-12 NOTE — Telephone Encounter (Signed)
Copied from [place] [id number]. Topic: Medicare AWV >> Jan 12, 2022  3:20 PM Devoria Glassing wrote: Reason for CRM: Called  patient to schedule Annual Wellness Visit.  Please schedule with Nurse Health Advisor Denisa O'Brien-Blaney, LPN at [facility]. This appt can be telephone or office visit.  Please call [phone] ask for [facility]

## 2022-01-26 ENCOUNTER — Encounter: Payer: Self-pay | Admitting: Internal Medicine

## 2022-01-26 DIAGNOSIS — R195 Other fecal abnormalities: Secondary | ICD-10-CM | POA: Diagnosis not present

## 2022-01-27 ENCOUNTER — Encounter: Payer: Self-pay | Admitting: Internal Medicine

## 2022-01-27 ENCOUNTER — Ambulatory Visit (INDEPENDENT_AMBULATORY_CARE_PROVIDER_SITE_OTHER): Payer: Medicare Other | Admitting: Internal Medicine

## 2022-01-27 ENCOUNTER — Ambulatory Visit
Admission: RE | Admit: 2022-01-27 | Discharge: 2022-01-27 | Disposition: A | Discharge status: Home / Self Care | Payer: Medicare Other | Attending: Internal Medicine | Admitting: Internal Medicine

## 2022-01-27 ENCOUNTER — Ambulatory Visit
Admission: RE | Admit: 2022-01-27 | Discharge: 2022-01-27 | Disposition: A | Discharge status: Home / Self Care | Payer: Medicare Other | Source: Ambulatory Visit | Attending: Internal Medicine | Admitting: Internal Medicine

## 2022-01-27 VITALS — BP 120/60 | HR 71 | Temp 97.5°F | Ht 67.0 in | Wt 163.8 lb

## 2022-01-27 DIAGNOSIS — M79671 Pain in right foot: Secondary | ICD-10-CM | POA: Diagnosis not present

## 2022-01-27 DIAGNOSIS — S93601A Unspecified sprain of right foot, initial encounter: Secondary | ICD-10-CM | POA: Diagnosis not present

## 2022-01-27 DIAGNOSIS — M25471 Effusion, right ankle: Secondary | ICD-10-CM

## 2022-01-27 DIAGNOSIS — M25571 Pain in right ankle and joints of right foot: Secondary | ICD-10-CM | POA: Diagnosis not present

## 2022-01-27 DIAGNOSIS — S99911A Unspecified injury of right ankle, initial encounter: Secondary | ICD-10-CM | POA: Diagnosis not present

## 2022-01-27 NOTE — Telephone Encounter (Signed)
Attempted to call pt. Need to schedule her for an appt to have her ankle looked at.

## 2022-01-27 NOTE — Progress Notes (Signed)
Chief Complaint  Patient presents with   Joint Swelling    Rt ankle is swollen   F/u  1. Right inner ankle and midfoot pain since Sunday she may have twisted her ankle in the yard not sure. Yesterday pain was 6-8/10 today milder has tried Tylenol unable to take nsaids    Review of Systems  Musculoskeletal:  Positive for joint pain.   Past Medical History:  Diagnosis Date   Breast cancer ([place]) 2000   RT MASTECTOMY   GERD (gastroesophageal reflux disease)    Hyperlipidemia    Personal history of chemotherapy 2000   BREAST CA   Personal history of radiation therapy 2000   BREAST CA   Past Surgical History:  Procedure Laterality Date   BREAST SURGERY     mastectomy, left breast    MASTECTOMY Right 2000   BREAST CA   ROBOTIC ASSISTED TOTAL HYSTERECTOMY  06/16/2021   Family History  Problem Relation Age of Onset   Hypertension Mother    Heart disease Father    Cancer Maternal Grandmother    Breast cancer Neg Hx    Social History   Socioeconomic History   Marital status: Married    Spouse name: Rush Landmark   Number of children: Not on file   Years of education: Not on file   Highest education level: Bachelor's degree (e.g., BA, AB, BS)  Occupational History   Not on file  Tobacco Use   Smoking status: Never   Smokeless tobacco: Never  Vaping Use   Vaping Use: Never used  Substance and Sexual Activity   Alcohol use: Yes    Comment: OCC   Drug use: No   Sexual activity: Not on file  Other Topics Concern   Not on file  Social History Narrative   Married    Oceanographer    Social Determinants of Health   Financial Resource Strain: Low Risk  (09/17/2020)   Overall Financial Resource Strain (CARDIA)    Difficulty of Paying Living Expenses: Not hard at all  Food Insecurity: No Food Insecurity (09/17/2020)   Hunger Vital Sign    Worried About Running Out of Food in the Last Year: Never true    [place] in the Last Year: Never true  Transportation Needs:  No Transportation Needs (09/17/2020)   PRAPARE - Hydrologist (Medical): No    Lack of Transportation (Non-Medical): No  Physical Activity: Insufficiently Active (09/17/2020)   Exercise Vital Sign    Days of Exercise per Week: 3 days    Minutes of Exercise per Session: 30 min  Stress: No Stress Concern Present (09/17/2020)   [place]    Feeling of Stress : Not at all  Social Connections: [place] (09/17/2020)   Social Connection and Isolation Panel [NHANES]    Frequency of Communication with Friends and Family: More than three times a week    Frequency of Social Gatherings with Friends and Family: More than three times a week    Attends Religious Services: 1 to 4 times per year    Active Member of [employer] or Organizations: Yes    Attends Archivist Meetings: 1 to 4 times per year    Marital Status: Married  Human resources officer Violence: Not At Risk (09/17/2020)   Humiliation, Afraid, Rape, and Kick questionnaire    Fear of Current or Ex-Partner: No    Emotionally Abused: No  Physically Abused: No    Sexually Abused: No   Current Meds  Medication Sig   aspirin 81 MG tablet Take 81 mg by mouth daily.   Cholecalciferol 25 MCG (1000 UT) tablet Take by mouth.   Multiple Vitamin (MULTIVITAMIN) tablet Take 1 tablet by mouth daily.   simvastatin (ZOCOR) 40 MG tablet Take 1 tablet (40 mg total) by mouth every evening.   No Known Allergies Recent Results (from the past 2160 hour(s))  Cologuard     Status: Abnormal   Collection Time: 12/31/21 12:01 AM  Result Value Ref Range   COLOGUARD Positive (A) Negative    Comment:  POSITIVE TEST RESULT. A positive Cologuard result should be followed with a colonoscopy or visual examination of the colon. The normal value (reference range) for this assay is negative.  TEST DESCRIPTION: Composite algorithmic analysis of stool DNA-biomarkers  with hemoglobin immunoassay.   Quantitative values of individual biomarkers are not reportable and are not associated with individual biomarker result reference ranges. Cologuard is intended for colorectal cancer screening of adults of either sex, 93 years or older, who are at average-risk for colorectal cancer (CRC). Cologuard has been approved for use by the U.S. FDA. The performance of Cologuard was established in a cross sectional study of average-risk adults aged 74-84. Cologuard performance in patients ages 32 to 28 years was estimated by sub-group analysis of near-age groups. Colonoscopies performed for a positive result may find as the most clinically significant lesion: colorectal cancer [4.0%], advanced adenoma  (including sessile serrated polyps greater than or equal to 1cm diameter) [20%] or non- advanced adenoma [31%]; or no colorectal neoplasia [45%]. These estimates are derived from a prospective cross-sectional screening study of 10,000 individuals at average risk for colorectal cancer who were screened with both Cologuard and colonoscopy. (Imperiale T. et al, Alison Stalling J Med 2014;370(14):1286-1297.) Cologuard may produce a false negative or false positive result (no colorectal cancer or precancerous polyp present at colonoscopy follow up). A negative Cologuard test result does not guarantee the absence of CRC or advanced adenoma (pre-cancer). The current Cologuard screening interval is every 3 years. 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 Cologuard performance data in a 10,000 patient pivotal study using colonoscopy as the reference method can be accessed at the following location: www.exactlabs.com/results. Additional description of the Cologuard test process,  warnings and precautions can be found at www.cologuard.com.    Objective  Body mass index is 25.65 kg/m. Wt Readings from Last 3 Encounters:  01/27/22 163 lb 12.8 oz (74.3 kg)  12/17/21 162 lb (73.5 kg)  10/14/21  163 lb 3.2 oz (74 kg)   Temp Readings from Last 3 Encounters:  01/27/22 (!) 97.5 F (36.4 C) (Oral)  12/17/21 (!) 97.4 F (36.3 C) (Oral)  10/14/21 (!) 97.5 F (36.4 C) (Oral)   BP Readings from Last 3 Encounters:  01/27/22 120/60  12/17/21 118/70  10/14/21 120/72   Pulse Readings from Last 3 Encounters:  01/27/22 71  12/17/21 (!) 43  10/14/21 89    Physical Exam Vitals and nursing note reviewed.  Constitutional:      Appearance: Normal appearance. She is well-developed and well-groomed.  HENT:     Head: Normocephalic and atraumatic.  Eyes:     Conjunctiva/sclera: Conjunctivae normal.     Pupils: Pupils are equal, round, and reactive to light.  Cardiovascular:     Rate and Rhythm: Normal rate and regular rhythm.     Heart sounds: Normal heart sounds. No murmur  heard. Pulmonary:     Effort: Pulmonary effort is normal.     Breath sounds: Normal breath sounds.  Abdominal:     General: Abdomen is flat. Bowel sounds are normal.     Tenderness: There is no abdominal tenderness.  Musculoskeletal:     Right foot: Tenderness present.       Legs:  Skin:    General: Skin is warm and dry.  Neurological:     General: No focal deficit present.     Mental Status: She is alert and oriented to person, place, and time. Mental status is at baseline.     Cranial Nerves: Cranial nerves 2-12 are intact.     Motor: Motor function is intact.     Coordination: Coordination is intact.     Gait: Gait is intact.  Psychiatric:        Attention and Perception: Attention and perception normal.        Mood and Affect: Mood and affect normal.        Speech: Speech normal.        Behavior: Behavior normal. Behavior is cooperative.        Thought Content: Thought content normal.        Cognition and Memory: Cognition and memory normal.        Judgment: Judgment normal.     Assessment  Plan  Sprain of right foot, initial encounter  Right ankle swelling - Plan: DG Ankle Complete Right,  DG Foot Complete Right  Right foot pain - Plan: DG Ankle Complete Right, DG Foot Complete Right  Acute right ankle pain - Plan: DG Ankle Complete Right, DG Foot Complete Right   [place]  Prn tylenol  Aspercream with lidocaine or Voltaren gel May need f/u with Dr. Daylene Katayama  Rx aircast air select right foot/ankle can get senior medical supply in [place]   Provider: Dr. Olivia Mackie McLean-Scocuzza-Internal Medicine

## 2022-01-27 NOTE — Patient Instructions (Addendum)
Aspercream with lidocaine or Voltaren gel  Xray [facility] outpatient imaging  Address: [address], [place], [place] [address] Phone: [phone]   [place]. 4.2 [phone])  Medical supply store 6.9 mi  [address] Open ? Closes 4:30?PM  [phone] In-store shopping Delivery  Dr. Daylene Katayama  Phone Fax E-mail Address  [phone] Not available [address] [place] [address]     Specialties     Podiatry             Phone Fax E-mail Address  [phone] Not available [place] [address]   Ankle Sprain  An ankle sprain is a stretch or tear in a ligament in the ankle. Ligaments are tissues that connect bones to each other. The two most common types of ankle sprains are: Inversion sprain. This happens when the foot turns inward and the ankle rolls outward. It affects the ligament on the outside of the foot (lateral ligament). Eversion sprain. This happens when the foot turns outward and the ankle rolls inward. It affects the ligament on the inner side of the foot (medial ligament). What are the causes? This condition is often caused by accidentally rolling or twisting the ankle. What increases the risk? You are more likely to develop this condition if you play sports. What are the signs or symptoms? Symptoms of this condition include: Pain in your ankle. Swelling. Bruising. This may develop right after you sprain your ankle or 1-2 days later. Trouble standing or walking, especially when you turn or change directions. How is this diagnosed? This condition is diagnosed with: A physical exam. During the exam, your health care provider will press on certain parts of your foot and ankle and try to move them in certain ways. X-ray imaging. These may be taken to see how severe the sprain is and to check for broken bones. How is this treated? This condition may be  treated with: A brace or splint. This is used to keep the ankle from moving until it heals. An elastic bandage. This is used to support the ankle. Crutches. Pain medicine. Surgery. This may be needed if the sprain is severe. Physical therapy. This may help to improve the range of motion in the ankle. Follow these instructions at home: If you have a brace or a splint: Wear the brace or splint as told by your health care provider. Remove it only as told by your health care provider. Loosen the brace or splint if your toes tingle, become numb, or turn cold and blue. Keep the brace or splint clean. If the brace or splint is not waterproof: Do not let it get wet. Cover it with a watertight covering when you take a bath or a shower. If you have an elastic bandage (dressing): Remove it to shower or bathe. Try not to move your ankle much, but wiggle your toes from time to time. This helps to prevent swelling. Adjust the dressing to make it more comfortable if it feels too tight. Loosen the dressing if you have numbness or tingling in your foot, or if your foot becomes cold and blue. Managing pain, stiffness, and swelling  Take over-the-counter and prescription medicines only as told by your health care provider. For 2-3 days, keep your ankle raised (elevated) above the level of your heart as much as possible. If directed, put ice on the injured area: If  you have a removable brace or splint, remove it as told by your health care provider. Put ice in a plastic bag. Place a towel between your skin and the bag. Leave the ice on for 20 minutes, 2-3 times a day. General instructions Rest your ankle. Do not use the injured limb to support your body weight until your health care provider says that you can. Use crutches as told by your health care provider. Do not use any products that contain nicotine or tobacco, such as cigarettes, e-cigarettes, and chewing tobacco. If you need help quitting, ask  your health care provider. Keep all follow-up visits as told by your health care provider. This is important. Contact a health care provider if: You have rapidly increasing bruising or swelling. Your pain is not relieved with medicine. Get help right away if: Your foot or toes become numb or blue. You have severe pain that gets worse. Summary An ankle sprain is a stretch or tear in a ligament in the ankle. Ligaments are tissues that connect bones to each other. This condition is often caused by accidentally rolling or twisting the ankle. Symptoms include pain, swelling, bruising, and trouble walking. To relieve pain and swelling, put ice on the affected ankle, raise your ankle above the level of your heart, and use an elastic bandage. Keep all follow-up visits as told by your health care provider. This is important. This information is not intended to replace advice given to you by your health care provider. Make sure you discuss any questions you have with your health care provider. Document Revised: 06/12/2020 Document Reviewed: 06/12/2020 Elsevier Patient Education  [place].

## 2022-01-27 NOTE — Telephone Encounter (Signed)
Pt is scheduled with Dr. Olivia Mackie tomorrow.

## 2022-02-09 ENCOUNTER — Encounter: Payer: Self-pay | Admitting: Internal Medicine

## 2022-02-10 ENCOUNTER — Ambulatory Visit: Payer: Medicare Other | Admitting: Anesthesiology

## 2022-02-10 ENCOUNTER — Other Ambulatory Visit: Payer: Self-pay

## 2022-02-10 ENCOUNTER — Ambulatory Visit
Admission: RE | Admit: 2022-02-10 | Discharge: 2022-02-10 | Disposition: A | Discharge status: Home / Self Care | Payer: Medicare Other | Source: Ambulatory Visit | Attending: Internal Medicine | Admitting: Internal Medicine

## 2022-02-10 ENCOUNTER — Encounter
Admission: RE | Disposition: A | Discharge status: Home / Self Care | Payer: Self-pay | Source: Ambulatory Visit | Attending: Internal Medicine

## 2022-02-10 ENCOUNTER — Encounter: Payer: Self-pay | Admitting: Internal Medicine

## 2022-02-10 DIAGNOSIS — K648 Other hemorrhoids: Secondary | ICD-10-CM | POA: Diagnosis not present

## 2022-02-10 DIAGNOSIS — K5730 Diverticulosis of large intestine without perforation or abscess without bleeding: Secondary | ICD-10-CM | POA: Diagnosis not present

## 2022-02-10 DIAGNOSIS — Z1211 Encounter for screening for malignant neoplasm of colon: Secondary | ICD-10-CM | POA: Insufficient documentation

## 2022-02-10 DIAGNOSIS — K640 First degree hemorrhoids: Secondary | ICD-10-CM | POA: Diagnosis not present

## 2022-02-10 DIAGNOSIS — K219 Gastro-esophageal reflux disease without esophagitis: Secondary | ICD-10-CM | POA: Insufficient documentation

## 2022-02-10 DIAGNOSIS — R195 Other fecal abnormalities: Secondary | ICD-10-CM | POA: Insufficient documentation

## 2022-02-10 HISTORY — PX: COLONOSCOPY WITH PROPOFOL: SHX5780

## 2022-02-10 SURGERY — COLONOSCOPY WITH PROPOFOL
Anesthesia: General

## 2022-02-10 MED ORDER — PROPOFOL 500 MG/50ML IV EMUL
INTRAVENOUS | Status: DC | PRN
  Administered 2022-02-10: 150 ug/kg/min via INTRAVENOUS

## 2022-02-10 MED ORDER — LIDOCAINE HCL (CARDIAC) PF 100 MG/5ML IV SOSY
PREFILLED_SYRINGE | INTRAVENOUS | Status: DC | PRN
  Administered 2022-02-10: 100 mg via INTRAVENOUS

## 2022-02-10 MED ORDER — SODIUM CHLORIDE 0.9 % IV SOLN: INTRAVENOUS | Status: DC

## 2022-02-10 MED ORDER — PROPOFOL 10 MG/ML IV BOLUS
INTRAVENOUS | Status: DC | PRN
  Administered 2022-02-10: 60 mg via INTRAVENOUS
  Administered 2022-02-10 (×2): 20 mg via INTRAVENOUS

## 2022-02-10 NOTE — Transfer of Care (Signed)
Immediate Anesthesia Transfer of Care Note  Patient: Lori Caldwell  Procedure(s) Performed: COLONOSCOPY WITH PROPOFOL  Patient Location: Endoscopy Unit  Anesthesia Type:General  Level of Consciousness: drowsy  Airway & Oxygen Therapy: Patient Spontanous Breathing  Post-op Assessment: Report given to RN and Post -op Vital signs reviewed and stable  Post vital signs: Reviewed and stable  Last Vitals:  Vitals Value Taken Time  BP    Temp 35.8 C 02/10/22 1146  Pulse    Resp    SpO2      Last Pain:  Vitals:   02/10/22 1146  TempSrc: Temporal  PainSc:          Complications: No notable events documented.

## 2022-02-10 NOTE — Anesthesia Preprocedure Evaluation (Signed)
Anesthesia Evaluation  Patient identified by MRN, date of birth, ID band Patient awake    Reviewed: Allergy & Precautions, NPO status , Patient's Chart, lab work & pertinent test results  History of Anesthesia Complications (+) DIFFICULT IV STICK / SPECIAL LINE and history of anesthetic complications  Airway Mallampati: III  TM Distance: <3 FB Neck ROM: full    Dental  (+) Chipped   Pulmonary neg pulmonary ROS, neg shortness of breath,    Pulmonary exam normal        Cardiovascular Exercise Tolerance: Good (-) anginanegative cardio ROS Normal cardiovascular exam     Neuro/Psych negative neurological ROS  negative psych ROS   GI/Hepatic Neg liver ROS, GERD  Controlled,  Endo/Other  negative endocrine ROS  Renal/GU negative Renal ROS  negative genitourinary   Musculoskeletal   Abdominal   Peds  Hematology negative hematology ROS (+)   Anesthesia Other Findings Past Medical History: 2000: Breast cancer ([place])     Comment:  RT MASTECTOMY No date: GERD (gastroesophageal reflux disease) No date: Hyperlipidemia 2000: Personal history of chemotherapy     Comment:  BREAST CA 2000: Personal history of radiation therapy     Comment:  BREAST CA  Past Surgical History: No date: BREAST SURGERY     Comment:  mastectomy, left breast  2000: MASTECTOMY; Right     Comment:  BREAST CA 06/16/2021: ROBOTIC ASSISTED TOTAL HYSTERECTOMY  BMI    Body Mass Index: 25.06 kg/m      Reproductive/Obstetrics negative OB ROS                             Anesthesia Physical Anesthesia Plan  ASA: 2  Anesthesia Plan: General   Post-op Pain Management:    Induction: Intravenous  PONV Risk Score and Plan: Propofol infusion and TIVA  Airway Management Planned: Natural Airway and Nasal Cannula  Additional Equipment:   Intra-op Plan:   Post-operative Plan:   Informed Consent: I have reviewed the  patients History and Physical, chart, labs and discussed the procedure including the risks, benefits and alternatives for the proposed anesthesia with the patient or authorized representative who has indicated his/her understanding and acceptance.     Dental Advisory Given  Plan Discussed with: Anesthesiologist, CRNA and Surgeon  Anesthesia Plan Comments: (Patient consented for risks of anesthesia including but not limited to:  - adverse reactions to medications - risk of airway placement if required - damage to eyes, teeth, lips or other oral mucosa - nerve damage due to positioning  - sore throat or hoarseness - Damage to heart, brain, nerves, lungs, other parts of body or loss of life  Patient voiced understanding.)        Anesthesia Quick Evaluation

## 2022-02-10 NOTE — Anesthesia Postprocedure Evaluation (Signed)
Anesthesia Post Note  Patient: Lori Caldwell  Procedure(s) Performed: COLONOSCOPY WITH PROPOFOL  Patient location during evaluation: Endoscopy Anesthesia Type: General Level of consciousness: awake and alert Pain management: pain level controlled Vital Signs Assessment: post-procedure vital signs reviewed and stable Respiratory status: spontaneous breathing, nonlabored ventilation, respiratory function stable and patient connected to nasal cannula oxygen Cardiovascular status: blood pressure returned to baseline and stable Postop Assessment: no apparent nausea or vomiting Anesthetic complications: no   No notable events documented.   Last Vitals:  Vitals:   02/10/22 1156 02/10/22 1206  BP: (!) 129/57 (!) 151/72  Pulse: 92 77  Resp: 20 19  Temp:    SpO2: 99% 100%    Last Pain:  Vitals:   02/10/22 1206  TempSrc:   PainSc: 0-No pain                 Precious Haws Chaos Carlile

## 2022-02-10 NOTE — Op Note (Signed)
[facility] Gastroenterology Patient Name: Lori Caldwell Procedure Date: 02/10/2022 11:12 AM MRN: [id number] Account #: [id number] Date of Birth: 12-Apr-1941 Admit Type: Outpatient Age: 81 Room: [facility] ENDO ROOM 2 Gender: Female Note Status: Finalized Instrument Name: Colonoscope [id number] Procedure:             Colonoscopy Indications:           Positive Cologuard test Providers:             Benay Pike. Alice Reichert MD, MD Referring MD:          Deborra Medina, MD (Referring MD) Medicines:             Propofol per Anesthesia Complications:         No immediate complications. Procedure:             Pre-Anesthesia Assessment:                        - The risks and benefits of the procedure and the                         sedation options and risks were discussed with the                         patient. All questions were answered and informed                         consent was obtained.                        - Patient identification and proposed procedure were                         verified prior to the procedure by the nurse. The                         procedure was verified in the procedure room.                        - ASA Grade Assessment: III - A patient with severe                         systemic disease.                        - After reviewing the risks and benefits, the patient                         was deemed in satisfactory condition to undergo the                         procedure.                        After obtaining informed consent, the colonoscope was                         passed under direct vision. Throughout the procedure,                         the patient's blood  pressure, pulse, and oxygen                         saturations were monitored continuously. The                         Colonoscope was introduced through the anus and                         advanced to the the cecum, identified by appendiceal                         orifice and  ileocecal valve. The colonoscopy was                         performed without difficulty. The patient tolerated                         the procedure well. The quality of the bowel                         preparation was good. The ileocecal valve, appendiceal                         orifice, and rectum were photographed. Findings:      The perianal and digital rectal examinations were normal. Pertinent       negatives include normal sphincter tone and no palpable rectal lesions.      Many small-mouthed diverticula were found in the sigmoid colon.      Non-bleeding internal hemorrhoids were found during retroflexion. The       hemorrhoids were mild and Grade I (internal hemorrhoids that do not       prolapse).      The exam was otherwise without abnormality. Impression:            - Diverticulosis in the sigmoid colon.                        - Non-bleeding internal hemorrhoids.                        - The examination was otherwise normal.                        - No specimens collected. Recommendation:        - Patient has a contact number available for                         emergencies. The signs and symptoms of potential                         delayed complications were discussed with the patient.                         Return to normal activities tomorrow. Written                         discharge instructions were provided to the patient.                        -  Resume previous diet.                        - Continue present medications.                        - No repeat colonoscopy due to age and the absence of                         advanced adenomas.                        - You do NOT require further colon cancer screening                         measures (Annual stool testing (i.e. hemoccult, FIT,                         cologuard), sigmoidoscopy, colonoscopy or CT                         colonography). You should share this recommendation                         with  your Primary Care provider.                        - Return to GI office PRN.                        - The findings and recommendations were discussed with                         the patient. Procedure Code(s):     --- Professional ---                        [phone], Colonoscopy, flexible; diagnostic, including                         collection of specimen(s) by brushing or washing, when                         performed (separate procedure) Diagnosis Code(s):     --- Professional ---                        K57.30, Diverticulosis of large intestine without                         perforation or abscess without bleeding                        R19.5, Other fecal abnormalities                        K64.0, First degree hemorrhoids CPT copyright 2019 American Medical Association. All rights reserved. The codes documented in this report are preliminary and upon coder review may  be revised to meet current compliance requirements. Efrain Sella MD, MD 02/10/2022 11:46:47 AM This report has been signed electronically. Number of Addenda: 0 Note Initiated On: 02/10/2022 11:12 AM Scope  Withdrawal Time: 0 hours 6 minutes 13 seconds  Total Procedure Duration: 0 hours 15 minutes 21 seconds  Estimated Blood Loss:  Estimated blood loss: none.      [facility]

## 2022-02-11 ENCOUNTER — Encounter: Payer: Self-pay | Admitting: Internal Medicine

## 2022-02-23 ENCOUNTER — Telehealth: Payer: Self-pay | Admitting: Internal Medicine

## 2022-02-23 NOTE — Telephone Encounter (Signed)
Copied from [place] [id number]. Topic: Medicare AWV >> Feb 23, 2022  1:15 PM Devoria Glassing wrote: Reason for CRM: Left message for patient to schedule Annual Wellness Visit.  Please schedule with Nurse Health Advisor Denisa O'Brien-Blaney, LPN at [facility]. This appt can be telephone or office visit.  Please call [phone] ask for [facility]

## 2022-02-25 ENCOUNTER — Telehealth: Payer: Self-pay | Admitting: *Deleted

## 2022-02-25 NOTE — Patient Outreach (Signed)
  Care Coordination   02/25/2022 Name: Lori Caldwell MRN: [id number] DOB: [birth year]   Care Coordination Outreach Attempts:  An unsuccessful telephone outreach was attempted today to offer the patient information about available care coordination services as a benefit of their health plan.   Follow Up Plan:  Additional outreach attempts will be made to offer the patient care coordination information and services.   Encounter Outcome:  No Answer  Care Coordination Interventions Activated:  Yes   Care Coordination Interventions:  No, not indicated    [place] [facility] [phone]

## 2022-03-02 DIAGNOSIS — L821 Other seborrheic keratosis: Secondary | ICD-10-CM | POA: Diagnosis not present

## 2022-03-02 DIAGNOSIS — L578 Other skin changes due to chronic exposure to nonionizing radiation: Secondary | ICD-10-CM | POA: Diagnosis not present

## 2022-03-02 DIAGNOSIS — Z85828 Personal history of other malignant neoplasm of skin: Secondary | ICD-10-CM | POA: Diagnosis not present

## 2022-03-02 DIAGNOSIS — D692 Other nonthrombocytopenic purpura: Secondary | ICD-10-CM | POA: Diagnosis not present

## 2022-03-02 DIAGNOSIS — Z1283 Encounter for screening for malignant neoplasm of skin: Secondary | ICD-10-CM | POA: Diagnosis not present

## 2022-03-02 DIAGNOSIS — D225 Melanocytic nevi of trunk: Secondary | ICD-10-CM | POA: Diagnosis not present

## 2022-03-02 DIAGNOSIS — L812 Freckles: Secondary | ICD-10-CM | POA: Diagnosis not present

## 2022-03-02 DIAGNOSIS — R238 Other skin changes: Secondary | ICD-10-CM | POA: Diagnosis not present

## 2022-03-09 ENCOUNTER — Encounter: Payer: Self-pay | Admitting: Internal Medicine

## 2022-03-09 NOTE — Telephone Encounter (Signed)
I spoke with patient & she is scheduled 11/20 to discuss chronic constipation issues. She said that she is actually some better with her bowel regimen that she has been in the past & was okay waiting. She was offered sooner appointment but could not make it & did not want to see anyone but Dr. Derrel Nip.

## 2022-03-17 DIAGNOSIS — H6123 Impacted cerumen, bilateral: Secondary | ICD-10-CM | POA: Diagnosis not present

## 2022-03-17 DIAGNOSIS — H903 Sensorineural hearing loss, bilateral: Secondary | ICD-10-CM | POA: Diagnosis not present

## 2022-03-22 ENCOUNTER — Ambulatory Visit (INDEPENDENT_AMBULATORY_CARE_PROVIDER_SITE_OTHER): Payer: Medicare Other | Admitting: Internal Medicine

## 2022-03-22 ENCOUNTER — Encounter: Payer: Self-pay | Admitting: Internal Medicine

## 2022-03-22 VITALS — BP 138/82 | HR 76 | Temp 97.5°F | Ht 67.0 in | Wt 164.8 lb

## 2022-03-22 DIAGNOSIS — K5900 Constipation, unspecified: Secondary | ICD-10-CM

## 2022-03-22 DIAGNOSIS — R42 Dizziness and giddiness: Secondary | ICD-10-CM

## 2022-03-22 DIAGNOSIS — E782 Mixed hyperlipidemia: Secondary | ICD-10-CM | POA: Diagnosis not present

## 2022-03-22 NOTE — Progress Notes (Unsigned)
Subjective:  Patient ID: Lori Caldwell, female    DOB: 1941/02/28  Age: 81 y.o. MRN: [id number]  CC: There were no encounter diagnoses.   HPI Lori Caldwell presents for  Chief Complaint  Patient presents with   Follow-up    Chronic constipation   1) Chronic constipation: began in July 2022,  various trials since then.  Diverticulosis and internal hemorrhoids by Oct 2023 colonoscopy.  One week later developed constipation and after 10 days too 10 mg dulcolax with a :"b;olowout"  Currently taking benefiber twice daily  for the past 10 days,  has moved bowels daily since she has doubled the dose.  Less bloating than with miralax.    Outpatient Medications Prior to Visit  Medication Sig Dispense Refill   aspirin 81 MG tablet Take 81 mg by mouth daily.     Cholecalciferol 25 MCG (1000 UT) tablet Take by mouth.     Docusate Sodium (DSS) 100 MG CAPS Take 2 capsules by mouth at bedtime.     Multiple Vitamin (MULTIVITAMIN) tablet Take 1 tablet by mouth daily.     simvastatin (ZOCOR) 40 MG tablet Take 1 tablet (40 mg total) by mouth every evening. 90 tablet 3   Wheat Dextrin (BENEFIBER DRINK MIX PO) Take 1 Package by mouth 2 (two) times daily.     hydrocortisone 2.5 % cream Apply topically 2 (two) times daily. (Patient not taking: Reported on 03/22/2022) 28 g 5   ascorbic acid (VITAMIN C) 100 MG tablet Take by mouth. (Patient not taking: Reported on 03/22/2022)     No facility-administered medications prior to visit.    Review of Systems;  Patient denies headache, fevers, malaise, unintentional weight loss, skin rash, eye pain, sinus congestion and sinus pain, sore throat, dysphagia,  hemoptysis , cough, dyspnea, wheezing, chest pain, palpitations, orthopnea, edema, abdominal pain, nausea, melena, diarrhea, constipation, flank pain, dysuria, hematuria, urinary  Frequency, nocturia, numbness, tingling, seizures,  Focal weakness, Loss of consciousness,  Tremor, insomnia, depression,  anxiety, and suicidal ideation.      Objective:  BP (!) 140/68 (BP Location: Left Arm, Patient Position: Sitting, Cuff Size: Normal)   Pulse 76   Temp (!) 97.5 F (36.4 C) (Oral)   Ht '5\' 7"'$  (1.702 m)   Wt 164 lb 12.8 oz (74.8 kg)   SpO2 97%   BMI 25.81 kg/m   BP Readings from Last 3 Encounters:  03/22/22 (!) 140/68  02/10/22 (!) 151/72  01/27/22 120/60    Wt Readings from Last 3 Encounters:  03/22/22 164 lb 12.8 oz (74.8 kg)  02/10/22 160 lb (72.6 kg)  01/27/22 163 lb 12.8 oz (74.3 kg)    General appearance: alert, cooperative and appears stated age Ears: normal TM's and external ear canals both ears Throat: lips, mucosa, and tongue normal; teeth and gums normal Neck: no adenopathy, no carotid bruit, supple, symmetrical, trachea midline and thyroid not enlarged, symmetric, no tenderness/mass/nodules Back: symmetric, no curvature. ROM normal. No CVA tenderness. Lungs: clear to auscultation bilaterally Heart: regular rate and rhythm, S1, S2 normal, no murmur, click, rub or gallop Abdomen: soft, non-tender; bowel sounds normal; no masses,  no organomegaly Pulses: 2+ and symmetric Skin: Skin color, texture, turgor normal. No rashes or lesions Lymph nodes: Cervical, supraclavicular, and axillary nodes normal. Neuro:  awake and interactive with normal mood and affect. Higher cortical functions are normal. Speech is clear without word-finding difficulty or dysarthria. Extraocular movements are intact. Visual fields of both eyes are grossly intact. Sensation  to light touch is grossly intact bilaterally of upper and lower extremities. Motor examination shows 4+/5 symmetric hand grip and upper extremity and 5/5 lower extremity strength. There is no pronation or drift. Gait is non-ataxic   No results found for: "HGBA1C"  Lab Results  Component Value Date   CREATININE 0.74 10/09/2021   CREATININE 0.73 04/13/2021   CREATININE 0.80 12/23/2020    Lab Results  Component Value Date    WBC 4.7 04/13/2021   HGB 12.9 04/13/2021   HCT 38.3 04/13/2021   PLT 218.0 04/13/2021   GLUCOSE 81 10/09/2021   CHOL 182 10/09/2021   TRIG 154.0 (H) 10/09/2021   HDL 72.50 10/09/2021   LDLDIRECT 117.8 06/25/2013   LDLCALC 78 10/09/2021   ALT 18 10/09/2021   AST 23 10/09/2021   NA 141 10/09/2021   K 3.9 10/09/2021   CL 103 10/09/2021   CREATININE 0.74 10/09/2021   BUN 14 10/09/2021   CO2 27 10/09/2021   TSH 2.78 10/09/2021    No results found.  Assessment & Plan:   Problem List Items Addressed This Visit   None   I spent a total of   minutes with this patient in a face to face visit on the date of this encounter reviewing the last office visit with me in       ,  most recent visit with cardiology ,    ,  patient's diet and exercise habits, home blood pressure /blod sugar readings, recent ER visit including labs and imaging studies ,   and post visit ordering of testing and therapeutics.    Follow-up: No follow-ups on file.   Crecencio Mc, MD

## 2022-03-22 NOTE — Patient Instructions (Addendum)
Ok to continue benefiber morning and night.  It is the healthiest thing to take because it is FIBER   You can use dulcolax every 3 days IF NO BOWEL MOVEMENT IN 3 DAYS   YOU NEED 60 OUNCES OF WATER OR DECAFFEINATED BEVERAGE  YOU  NEED YOUR TETANUS VACCINE THIS DECEMBER .  WAIT TWO WEEKS FROM YOUR FLU VACCINE

## 2022-03-23 NOTE — Assessment & Plan Note (Signed)
Chronic for 1.5 years  colonoscopy unrevealing.  Continue benefier  bid,  add glycerin suppositories to ease passage of large stools

## 2022-03-29 ENCOUNTER — Ambulatory Visit (INDEPENDENT_AMBULATORY_CARE_PROVIDER_SITE_OTHER): Payer: Medicare Other

## 2022-03-29 VITALS — Ht 67.0 in | Wt 164.0 lb

## 2022-03-29 DIAGNOSIS — Z0000 Encounter for general adult medical examination without abnormal findings: Secondary | ICD-10-CM

## 2022-03-29 NOTE — Patient Instructions (Addendum)
Lori Caldwell , Thank you for taking time to come for your Medicare Wellness Visit. I appreciate your ongoing commitment to your health goals. Please review the following plan we discussed and let me know if I can assist you in the future.   These are the goals we discussed:  Goals      Follow up with Primary Care Provider     As needed.        This is a list of the screening recommended for you and due dates:  Health Maintenance  Topic Date Due   COVID-19 Vaccine (6 - 2023-24 season) 04/07/2022*   Medicare Annual Wellness Visit  03/30/2023   Pneumonia Vaccine  Completed   Flu Shot  Completed   DEXA scan (bone density measurement)  Completed   Zoster (Shingles) Vaccine  Completed   HPV Vaccine  Aged Out  *Topic was postponed. The date shown is not the original due date.    Advanced directives: on file  Conditions/risks identified: none new  Next appointment: Follow up in one year for your annual wellness visit    Preventive Care 65 Years and Older, Female Preventive care refers to lifestyle choices and visits with your health care provider that can promote health and wellness. What does preventive care include? A yearly physical exam. This is also called an annual well check. Dental exams once or twice a year. Routine eye exams. Ask your health care provider how often you should have your eyes checked. Personal lifestyle choices, including: Daily care of your teeth and gums. Regular physical activity. Eating a healthy diet. Avoiding tobacco and drug use. Limiting alcohol use. Practicing safe sex. Taking low-dose aspirin every day. Taking vitamin and mineral supplements as recommended by your health care provider. What happens during an annual well check? The services and screenings done by your health care provider during your annual well check will depend on your age, overall health, lifestyle risk factors, and family history of disease. Counseling  Your health care  provider may ask you questions about your: Alcohol use. Tobacco use. Drug use. Emotional well-being. Home and relationship well-being. Sexual activity. Eating habits. History of falls. Memory and ability to understand (cognition). Work and work Statistician. Reproductive health. Screening  You may have the following tests or measurements: Height, weight, and BMI. Blood pressure. Lipid and cholesterol levels. These may be checked every 5 years, or more frequently if you are over 60 years old. Skin check. Lung cancer screening. You may have this screening every year starting at age 68 if you have a 30-pack-year history of smoking and currently smoke or have quit within the past 15 years. Fecal occult blood test (FOBT) of the stool. You may have this test every year starting at age 20. Flexible sigmoidoscopy or colonoscopy. You may have a sigmoidoscopy every 5 years or a colonoscopy every 10 years starting at age 22. Hepatitis C blood test. Hepatitis B blood test. Sexually transmitted disease (STD) testing. Diabetes screening. This is done by checking your blood sugar (glucose) after you have not eaten for a while (fasting). You may have this done every 1-3 years. Bone density scan. This is done to screen for osteoporosis. You may have this done starting at age 48. Mammogram. This may be done every 1-2 years. Talk to your health care provider about how often you should have regular mammograms. Talk with your health care provider about your test results, treatment options, and if necessary, the need for more tests. Vaccines  Your health care provider may recommend certain vaccines, such as: Influenza vaccine. This is recommended every year. Tetanus, diphtheria, and acellular pertussis (Tdap, Td) vaccine. You may need a Td booster every 10 years. Zoster vaccine. You may need this after age 33. Pneumococcal 13-valent conjugate (PCV13) vaccine. One dose is recommended after age  70. Pneumococcal polysaccharide (PPSV23) vaccine. One dose is recommended after age 43. Talk to your health care provider about which screenings and vaccines you need and how often you need them. This information is not intended to replace advice given to you by your health care provider. Make sure you discuss any questions you have with your health care provider. Document Released: 05/16/2015 Document Revised: 01/07/2016 Document Reviewed: 02/18/2015 Elsevier Interactive Patient Education  2017 [employer] Prevention in the Home Falls can cause injuries. They can happen to people of all ages. There are many things you can do to make your home safe and to help prevent falls. What can I do on the outside of my home? Regularly fix the edges of walkways and driveways and fix any cracks. Remove anything that might make you trip as you walk through a door, such as a raised step or threshold. Trim any bushes or trees on the path to your home. Use bright outdoor lighting. Clear any walking paths of anything that might make someone trip, such as rocks or tools. Regularly check to see if handrails are loose or broken. Make sure that both sides of any steps have handrails. Any raised decks and porches should have guardrails on the edges. Have any leaves, snow, or ice cleared regularly. Use sand or salt on walking paths during winter. Clean up any spills in your garage right away. This includes oil or grease spills. What can I do in the bathroom? Use night lights. Install grab bars by the toilet and in the tub and shower. Do not use towel bars as grab bars. Use non-skid mats or decals in the tub or shower. If you need to sit down in the shower, use a plastic, non-slip stool. Keep the floor dry. Clean up any water that spills on the floor as soon as it happens. Remove soap buildup in the tub or shower regularly. Attach bath mats securely with double-sided non-slip rug tape. Do not have throw  rugs and other things on the floor that can make you trip. What can I do in the bedroom? Use night lights. Make sure that you have a light by your bed that is easy to reach. Do not use any sheets or blankets that are too big for your bed. They should not hang down onto the floor. Have a firm chair that has side arms. You can use this for support while you get dressed. Do not have throw rugs and other things on the floor that can make you trip. What can I do in the kitchen? Clean up any spills right away. Avoid walking on wet floors. Keep items that you use a lot in easy-to-reach places. If you need to reach something above you, use a strong step stool that has a grab bar. Keep electrical cords out of the way. Do not use floor polish or wax that makes floors slippery. If you must use wax, use non-skid floor wax. Do not have throw rugs and other things on the floor that can make you trip. What can I do with my stairs? Do not leave any items on the stairs. Make sure that there are  handrails on both sides of the stairs and use them. Fix handrails that are broken or loose. Make sure that handrails are as long as the stairways. Check any carpeting to make sure that it is firmly attached to the stairs. Fix any carpet that is loose or worn. Avoid having throw rugs at the top or bottom of the stairs. If you do have throw rugs, attach them to the floor with carpet tape. Make sure that you have a light switch at the top of the stairs and the bottom of the stairs. If you do not have them, ask someone to add them for you. What else can I do to help prevent falls? Wear shoes that: Do not have high heels. Have rubber bottoms. Are comfortable and fit you well. Are closed at the toe. Do not wear sandals. If you use a stepladder: Make sure that it is fully opened. Do not climb a closed stepladder. Make sure that both sides of the stepladder are locked into place. Ask someone to hold it for you, if  possible. Clearly mark and make sure that you can see: Any grab bars or handrails. First and last steps. Where the edge of each step is. Use tools that help you move around (mobility aids) if they are needed. These include: Canes. Walkers. Scooters. Crutches. Turn on the lights when you go into a dark area. Replace any light bulbs as soon as they burn out. Set up your furniture so you have a clear path. Avoid moving your furniture around. If any of your floors are uneven, fix them. If there are any pets around you, be aware of where they are. Review your medicines with your doctor. Some medicines can make you feel dizzy. This can increase your chance of falling. Ask your doctor what other things that you can do to help prevent falls. This information is not intended to replace advice given to you by your health care provider. Make sure you discuss any questions you have with your health care provider. Document Released: 02/13/2009 Document Revised: 09/25/2015 Document Reviewed: 05/24/2014 Elsevier Interactive Patient Education  2017 [employer].

## 2022-03-29 NOTE — Progress Notes (Cosign Needed Addendum)
Subjective:   Lori Caldwell is a 81 y.o. female who presents for Medicare Annual (Subsequent) preventive examination.  Review of Systems    No ROS.  Medicare Wellness Virtual Visit.  Visual/audio telehealth visit, UTA vital signs.   See social history for additional risk factors.   Cardiac Risk Factors include: advanced age (>74mn, >>103women)     Objective:    Today's Vitals   03/29/22 1307  Weight: 164 lb (74.4 kg)  Height: '5\' 7"'$  (1.702 m)   Body mass index is 25.69 kg/m.     03/29/2022    1:08 PM 02/10/2022   10:04 AM 09/17/2020    3:55 PM 05/18/2019    3:08 PM 05/31/2017    4:04 PM 09/06/2016    5:48 PM  Advanced Directives  Does Patient Have a Medical Advance Directive? Yes Yes Yes Yes Yes No  Type of ACorporate treasurerof APoint LookoutLiving will H[place]Living will [place] will [place]Living will   Does patient want to make changes to medical advance directive? No - Patient declined  No - Patient declined No - Patient declined No - Patient declined   Copy of H[place]in Chart? Yes - validated most recent copy scanned in chart (See row information) No - copy requested Yes - validated most recent copy scanned in chart (See row information) Yes - validated most recent copy scanned in chart (See row information) No - copy requested     Current Medications (verified) Outpatient Encounter Medications as of 03/29/2022  Medication Sig   aspirin 81 MG tablet Take 81 mg by mouth daily.   Cholecalciferol 25 MCG (1000 UT) tablet Take by mouth.   Docusate Sodium (DSS) 100 MG CAPS Take 2 capsules by mouth at bedtime.   hydrocortisone 2.5 % cream Apply topically 2 (two) times daily. (Patient not taking: Reported on 03/22/2022)   Multiple Vitamin (MULTIVITAMIN) tablet Take 1 tablet by mouth daily.   simvastatin (ZOCOR) 40 MG tablet Take 1 tablet (40 mg total) by mouth every  evening.   Wheat Dextrin (BENEFIBER DRINK MIX PO) Take 1 Package by mouth 2 (two) times daily.   No facility-administered encounter medications on file as of 03/29/2022.   Allergies (verified) Patient has no known allergies.   History: Past Medical History:  Diagnosis Date   [place]   RT MASTECTOMY   GERD (gastroesophageal reflux disease)    Hyperlipidemia    Personal history of chemotherapy 2000   BREAST CA   Personal history of radiation therapy 2000   BREAST CA   Past Surgical History:  Procedure Laterality Date   BREAST SURGERY     mastectomy, left breast    COLONOSCOPY WITH PROPOFOL N/A 02/10/2022   Procedure: COLONOSCOPY WITH PROPOFOL;  Surgeon: [place], TBenay Pike MD;  Location: [facility] ENDOSCOPY;  Service: Gastroenterology;  Laterality: N/A;   MASTECTOMY Right 2000   BREAST CA   ROBOTIC ASSISTED TOTAL HYSTERECTOMY  06/16/2021   Family History  Problem Relation Age of Onset   Hypertension Mother    Heart disease Father    Cancer Maternal Grandmother    Breast cancer Neg Hx    Social History   Socioeconomic History   Marital status: Married    Spouse name: Lori Caldwell  Number of children: Not on file   Years of education: Not on file   Highest education level: Bachelor's degree (e.g., BA, AB, BS)  Occupational History   Not on file  Tobacco Use   Smoking status: Never   Smokeless tobacco: Never  Vaping Use   Vaping Use: Never used  Substance and Sexual Activity   Alcohol use: Yes    Comment: OCC   Drug use: No   Sexual activity: Not on file  Other Topics Concern   Not on file  Social History Narrative   Married    Oceanographer    Social Determinants of Health   Financial Resource Strain: Low Risk  (03/29/2022)   Overall Financial Resource Strain (CARDIA)    Difficulty of Paying Living Expenses: Not hard at all  Food Insecurity: No Food Insecurity (03/29/2022)   Hunger Vital Sign    Worried About Running Out of Food in the Last  Year: Never true    Ran Out of Food in the Last Year: Never true  Transportation Needs: No Transportation Needs (03/29/2022)   PRAPARE - Hydrologist (Medical): No    Lack of Transportation (Non-Medical): No  Physical Activity: Sufficiently Active (03/29/2022)   Exercise Vital Sign    Days of Exercise per Week: 5 days    Minutes of Exercise per Session: 30 min  Stress: No Stress Concern Present (03/29/2022)   [place]    Feeling of Stress : Not at all  Social Connections: [place] (03/29/2022)   Social Connection and Isolation Panel [NHANES]    Frequency of Communication with Friends and Family: More than three times a week    Frequency of Social Gatherings with Friends and Family: More than three times a week    Attends Religious Services: 1 to 4 times per year    Active Member of [employer] or Organizations: Yes    Attends Archivist Meetings: 1 to 4 times per year    Marital Status: Married   Tobacco Counseling Counseling given: Not Answered  Clinical Intake: Pre-visit preparation completed: Yes        Diabetes: No  How often do you need to have someone help you when you read instructions, pamphlets, or other written materials from your doctor or pharmacy?: 1 - Never   Interpreter Needed?: No    Activities of Daily Living    03/29/2022    1:10 PM  In your present state of health, do you have any difficulty performing the following activities:  Hearing? 1  Comment Hearing aids in use.  Vision? 0  Difficulty concentrating or making decisions? 0  Walking or climbing stairs? 0  Dressing or bathing? 0  Doing errands, shopping? 0  Preparing Food and eating ? N  Using the Toilet? N  In the past six months, have you accidently leaked urine? N  Do you have problems with loss of bowel control? N  Managing your Medications? N  Managing your Finances? N   Housekeeping or managing your Housekeeping? N    Patient Care Team: Crecencio Mc, MD as PCP - General (Internal Medicine)  Indicate any recent Medical Services you may have received from other than [facility] providers in the past year (date may be approximate).     Assessment:   This is a routine wellness examination for [name].  I connected with  Lori Caldwell on 03/29/22 by a audio enabled telemedicine application and verified that I am speaking with the correct person using two identifiers.  Patient Location: Home  Provider Location: Office/Clinic  I discussed  the limitations of evaluation and management by telemedicine. The patient expressed understanding and agreed to proceed.   Hearing/Vision screen Hearing Screening - Comments:: Hearing aids Vision Screening - Comments:: Followed by Kathyrn Lass Wears corrective lenses  They have regular follow up with the ophthalmologist  Dietary issues and exercise activities discussed: Current Exercise Habits: Home exercise routine, Type of exercise: walking;strength training/weights;stretching (leg exercises), Time (Minutes): 30, Frequency (Times/Week): 5, Weekly Exercise (Minutes/Week): 150, Intensity: Mild   Goals Addressed             This Visit's Progress    Follow up with Primary Care Provider       As needed.       Depression Screen    03/29/2022    1:12 PM 03/29/2022    1:09 PM 03/22/2022    3:21 PM 01/27/2022    1:10 PM 12/17/2021   11:14 AM 10/14/2021   10:27 AM 07/02/2021    8:59 AM  PHQ 2/9 Scores  PHQ - 2 Score 0 0 0 0 0 0 0    Fall Risk    03/29/2022    1:09 PM 03/22/2022    3:21 PM 01/27/2022    1:10 PM 12/17/2021   11:13 AM 10/14/2021   10:27 AM  [place] in the past year? 0 0 0 0 1  Number falls in past yr: 0  0 0 0  Injury with Fall? 0  0 0 0  Risk for fall due to : No Fall Risks No Fall Risks No Fall Risks No Fall Risks History of fall(s)  Follow up Falls evaluation  completed;Falls prevention discussed Falls evaluation completed Falls evaluation completed Falls evaluation completed Falls evaluation completed    FALL RISK PREVENTION PERTAINING TO THE HOME: Home free of loose throw rugs in walkways, pet beds, electrical cords, etc? Yes  Adequate lighting in your home to reduce risk of falls? Yes   ASSISTIVE DEVICES UTILIZED TO PREVENT FALLS: Life alert? No  Use of a cane, walker or w/c? No   TIMED UP AND GO: Was the test performed? No .   Cognitive Function: Patient is alert and oriented x3.  Manages her own medications and finances.  Denies difficulty with memory or making decisions.  100% independent.      05/31/2017    4:43 PM  MMSE - Mini Mental State Exam  Orientation to time 5  Orientation to Place 5  Registration 3  Attention/ Calculation 5  Recall 3  Language- name 2 objects 2  Language- repeat 1  Language- follow 3 step command 3  Language- read & follow direction 1  Write a sentence 1  Copy design 1  Total score 30        03/29/2022    1:13 PM 05/18/2019    3:25 PM  6CIT Screen  What Year?  0 points  What month?  0 points  What time?  0 points  Count back from 20  0 points  Months in reverse 0 points 0 points  Repeat phrase  0 points  Total Score  0 points    Immunizations Immunization History  Administered Date(s) Administered   Influenza Split 02/19/2011   Influenza,inj,Quad PF,6+ Mos 01/15/2015   Influenza-Unspecified 03/03/2013, 04/02/2014, 02/17/2016, 02/07/2017, 03/09/2019, 03/03/2020, 02/16/2021, 03/12/2022   PFIZER Comirnaty(Gray Top)Covid-19 Tri-Sucrose Vaccine 02/06/2020   PFIZER(Purple Top)SARS-COV-2 Vaccination 05/09/2019, 05/30/2019, 02/06/2020   Pfizer Covid-19 Vaccine Bivalent Booster 31yr & up 03/17/2021   Pneumococcal Conjugate-13 12/28/2013  Pneumococcal Polysaccharide-23 12/22/2007, 12/21/2012   Tdap 11/21/2011   Zoster Recombinat (Shingrix) 08/24/2017, 10/24/2017   Zoster, Live  11/20/2012   Screening Tests Health Maintenance  Topic Date Due   COVID-19 Vaccine (6 - 2023-24 season) 04/07/2022 (Originally 01/01/2022)   Medicare Annual Wellness (AWV)  03/30/2023   Pneumonia Vaccine 23+ Years old  Completed   INFLUENZA VACCINE  Completed   DEXA SCAN  Completed   Zoster Vaccines- Shingrix  Completed   HPV VACCINES  Aged Out   Health Maintenance There are no preventive care reminders to display for this patient.  Lung Cancer Screening: (Low Dose CT Chest recommended if Age 34-80 years, 30 pack-year currently smoking OR have quit w/in 15years.) does not qualify.   Hepatitis C Screening: does not qualify.  Vision Screening: Recommended annual ophthalmology exams for early detection of glaucoma and other disorders of the eye.  Dental Screening: Recommended annual dental exams for proper oral hygiene  Community Resource Referral / Chronic Care Management: CRR required this visit?  No   CCM required this visit?  No      Plan:     I have personally reviewed and noted the following in the patient's chart:   Medical and social history Use of alcohol, tobacco or illicit drugs  Current medications and supplements including opioid prescriptions. Patient is not currently taking opioid prescriptions. Functional ability and status Nutritional status Physical activity Advanced directives List of other physicians Hospitalizations, surgeries, and ER visits in previous 12 months Vitals Screenings to include cognitive, depression, and falls Referrals and appointments  In addition, I have reviewed and discussed with patient certain preventive protocols, quality metrics, and best practice recommendations. A written personalized care plan for preventive services as well as general preventive health recommendations were provided to patient.     [place], LPN   [id number]    I have reviewed the above information and agree with above.   Deborra Medina,  MD

## 2022-04-14 NOTE — Telephone Encounter (Signed)
MyChart messgae sent to patient. 

## 2022-05-20 IMAGING — US US PELVIS COMPLETE WITH TRANSVAGINAL
1 series · 15 of 25 positions shown · non-contrast
Comparison: 12/23/2020 CT abdomen/pelvis.

CLINICAL DATA: 80-year-old postmenopausal female presents for
follow-up of incidental right ovarian cyst seen on recent CT.

EXAM:
TRANSABDOMINAL AND TRANSVAGINAL ULTRASOUND OF PELVIS
TECHNIQUE: Both transabdominal and transvaginal ultrasound examinations of the
pelvis were performed. Transabdominal technique was performed for
global imaging of the pelvis including uterus, ovaries, adnexal
regions, and pelvic cul-de-sac. It was necessary to proceed with
endovaginal exam following the transabdominal exam to visualize the
endometrium and adnexa.

[Series 1: us pelvis complete · 15 of 43 slices shown]
[im 1/43]
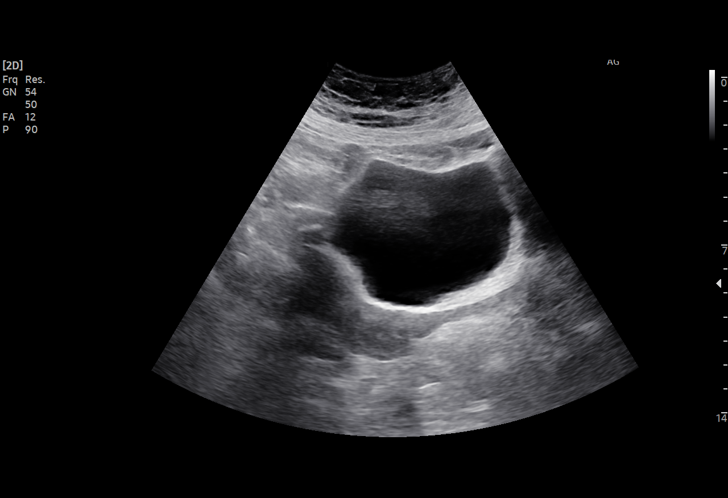
[im 4/43]
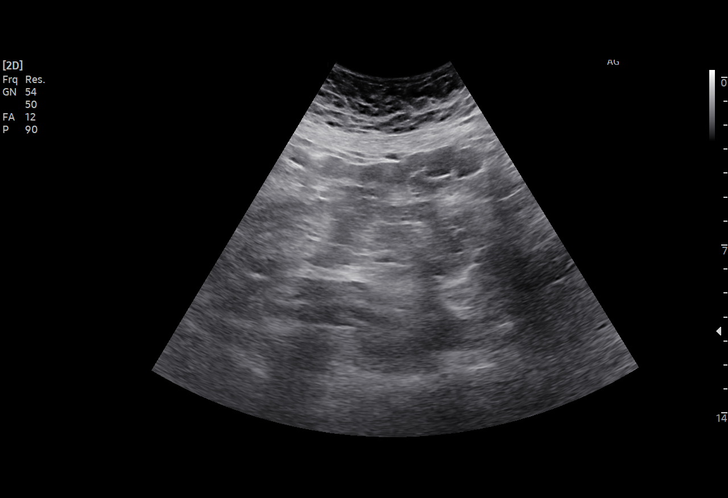
[im 8/43]
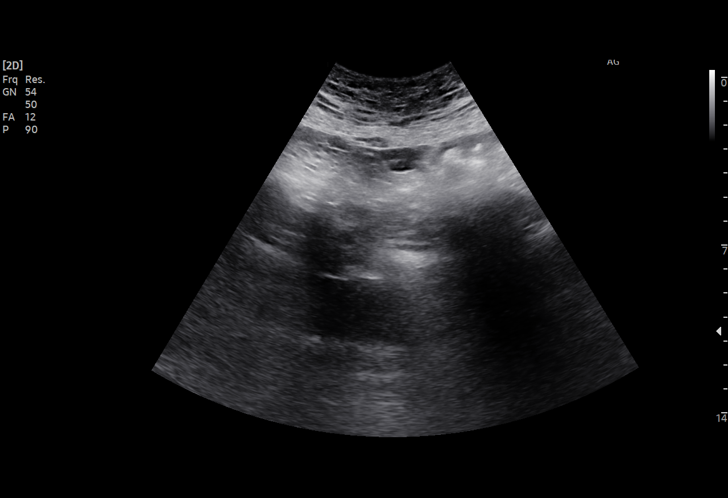
[im 9/43]
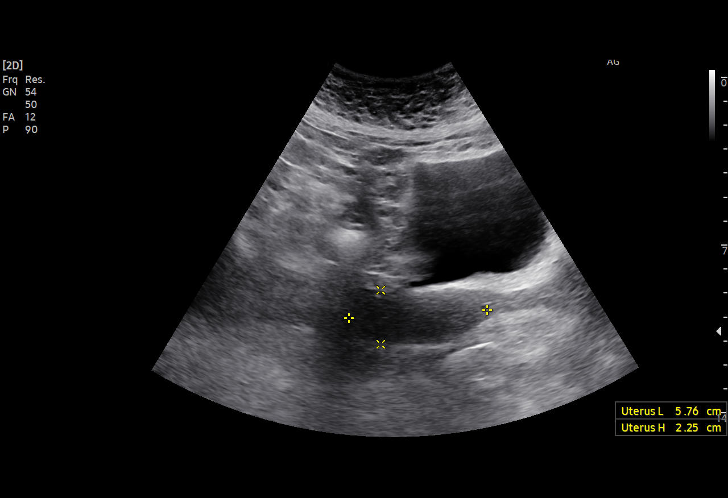
[im 13/43]
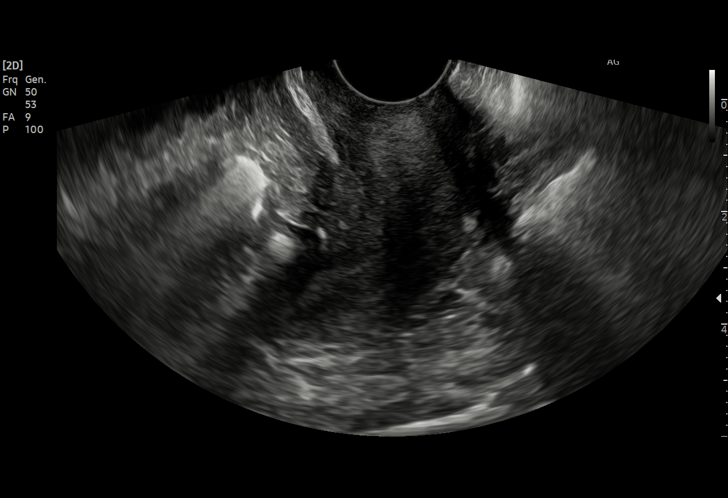
[im 16/43]
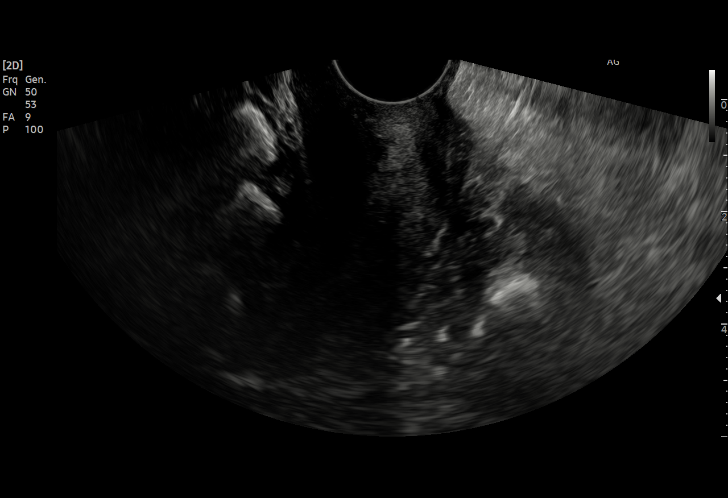
[im 18/43]
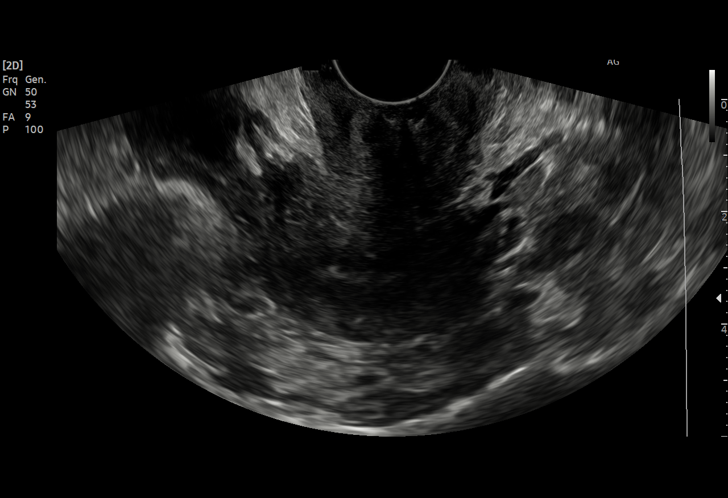
[im 22/43]
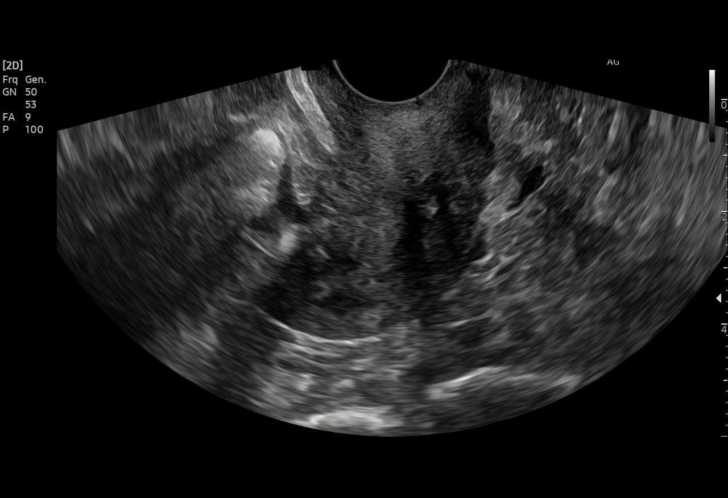
[im 25/43]
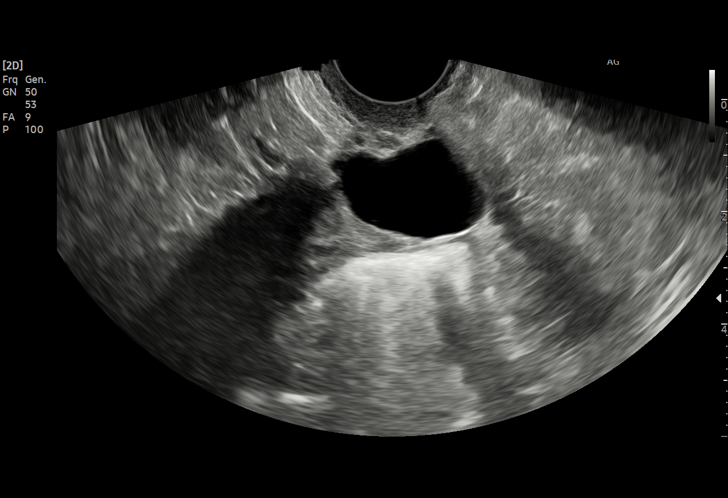
[im 27/43]
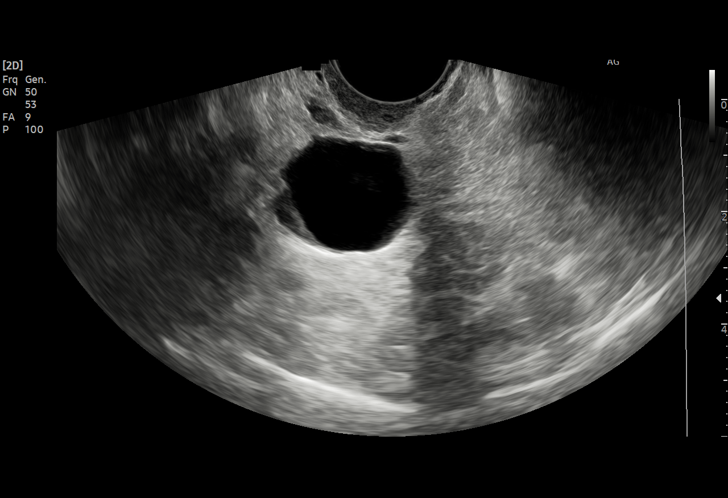
[im 30/43]
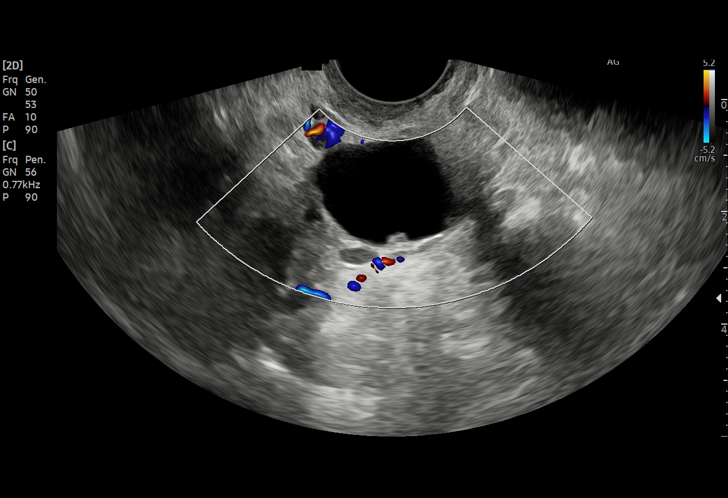
[im 34/43]
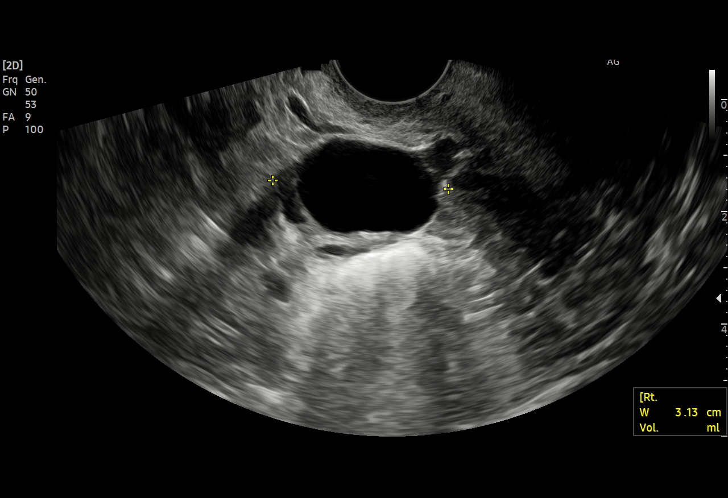
[im 36/43]
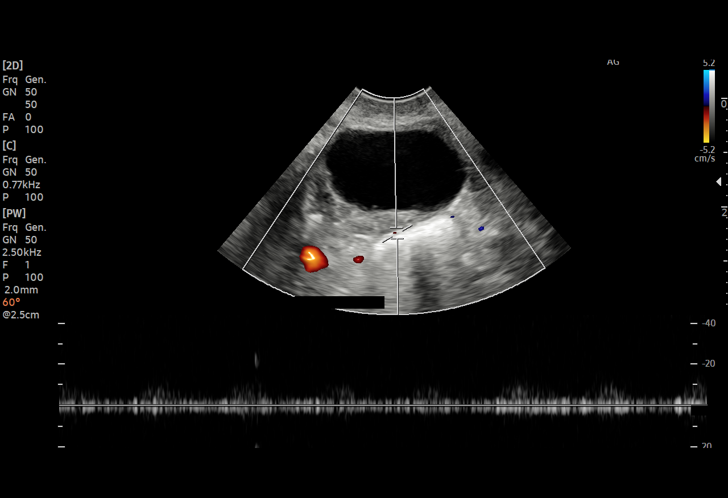
[im 39/43]
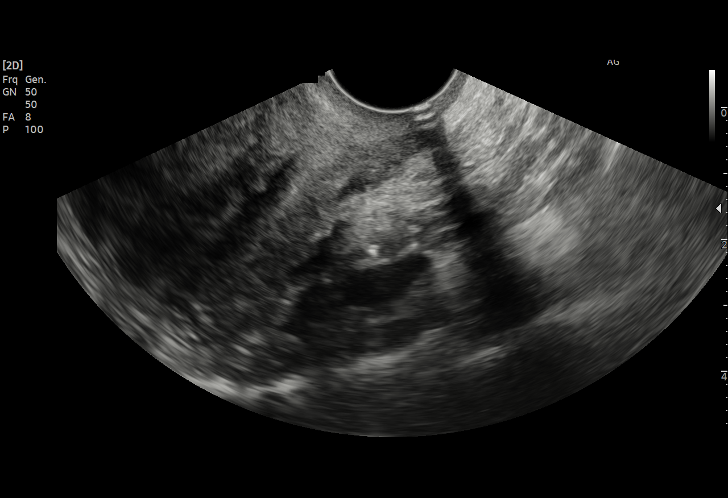
[im 43/43]
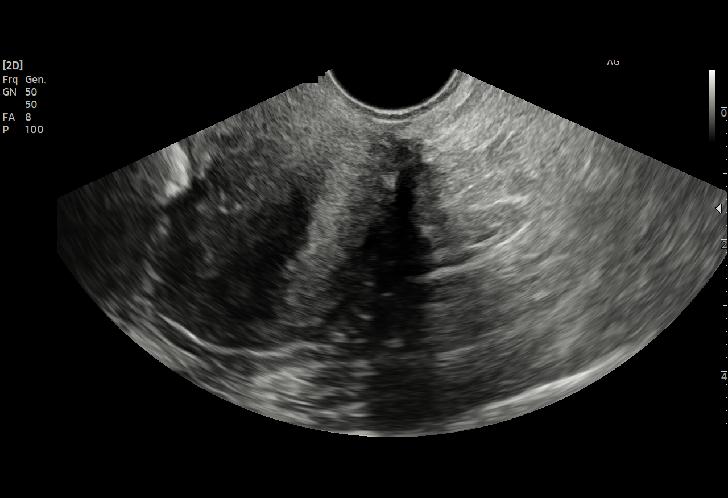

[15 of 25 positions shown; findings below may reference images not displayed]

FINDINGS: Uterus

Measurements: 5.5 x 1.8 x 4.2 cm = volume: 22 mL. Atrophic
anteverted uterus with no uterine fibroids or other myometrial
abnormality.

Endometrium

Thickness: 3 mm. No endometrial cavity fluid or focal endometrial
mass.

Right ovary

Measurements: 3.2 x 2.1 x 3.1 cm = volume: 11.1 mL. Mildly complex
right ovarian 2.6 x 1.9 x 2.5 cm cyst with tiny eccentric 0.2 cm
mural nodule without appreciable internal vascularity on Doppler.

Left ovary

Nonvisualization of the left ovary.  No left adnexal masses.

Other findings

No abnormal free fluid.
IMPRESSION: 1. Mildly complex 2.6 cm right ovarian cyst with tiny eccentric
cm mural nodule, without appreciable internal vascularity on
Doppler, indeterminate. Suggest attention on follow-up pelvic
sonogram in 3 months.
2. Nonvisualization of the left ovary.  No left adnexal mass.
3. Normal atrophic anteverted uterus.
4. No abnormal free fluid in the pelvis.

## 2022-06-23 ENCOUNTER — Encounter: Payer: Self-pay | Admitting: Internal Medicine

## 2022-06-23 ENCOUNTER — Ambulatory Visit (INDEPENDENT_AMBULATORY_CARE_PROVIDER_SITE_OTHER): Payer: Medicare Other | Admitting: Internal Medicine

## 2022-06-23 VITALS — BP 126/74 | HR 80 | Ht 67.0 in | Wt 165.8 lb

## 2022-06-23 DIAGNOSIS — R351 Nocturia: Secondary | ICD-10-CM

## 2022-06-23 DIAGNOSIS — Z853 Personal history of malignant neoplasm of breast: Secondary | ICD-10-CM

## 2022-06-23 DIAGNOSIS — E559 Vitamin D deficiency, unspecified: Secondary | ICD-10-CM

## 2022-06-23 DIAGNOSIS — Z1231 Encounter for screening mammogram for malignant neoplasm of breast: Secondary | ICD-10-CM

## 2022-06-23 DIAGNOSIS — E782 Mixed hyperlipidemia: Secondary | ICD-10-CM | POA: Diagnosis not present

## 2022-06-23 DIAGNOSIS — Z1211 Encounter for screening for malignant neoplasm of colon: Secondary | ICD-10-CM

## 2022-06-23 DIAGNOSIS — R42 Dizziness and giddiness: Secondary | ICD-10-CM | POA: Diagnosis not present

## 2022-06-23 DIAGNOSIS — K5900 Constipation, unspecified: Secondary | ICD-10-CM | POA: Diagnosis not present

## 2022-06-23 DIAGNOSIS — M8580 Other specified disorders of bone density and structure, unspecified site: Secondary | ICD-10-CM

## 2022-06-23 LAB — CBC WITH DIFFERENTIAL/PLATELET
Basophils Absolute: 0 10*3/uL (ref 0.0–0.1)
Basophils Relative: 0.4 % (ref 0.0–3.0)
Eosinophils Absolute: 0.1 10*3/uL (ref 0.0–0.7)
Eosinophils Relative: 1.3 % (ref 0.0–5.0)
HCT: 45.3 % (ref 36.0–46.0)
Hemoglobin: 15.1 g/dL — ABNORMAL HIGH (ref 12.0–15.0)
Lymphocytes Relative: 31.8 % (ref 12.0–46.0)
Lymphs Abs: 1.7 10*3/uL (ref 0.7–4.0)
MCHC: 33.3 g/dL (ref 30.0–36.0)
MCV: 99.8 fl (ref 78.0–100.0)
Monocytes Absolute: 0.4 10*3/uL (ref 0.1–1.0)
Monocytes Relative: 7.1 % (ref 3.0–12.0)
Neutro Abs: 3.1 10*3/uL (ref 1.4–7.7)
Neutrophils Relative %: 59.4 % (ref 43.0–77.0)
Platelets: 258 10*3/uL (ref 150.0–400.0)
RBC: 4.54 Mil/uL (ref 3.87–5.11)
RDW: 13.4 % (ref 11.5–15.5)
WBC: 5.2 10*3/uL (ref 4.0–10.5)

## 2022-06-23 LAB — TSH: TSH: 3.7 u[IU]/mL (ref 0.35–5.50)

## 2022-06-23 LAB — URINALYSIS, ROUTINE W REFLEX MICROSCOPIC
Bilirubin Urine: NEGATIVE
Ketones, ur: NEGATIVE
Leukocytes,Ua: NEGATIVE
Nitrite: NEGATIVE
Specific Gravity, Urine: 1.02 (ref 1.000–1.030)
Total Protein, Urine: NEGATIVE
Urine Glucose: NEGATIVE
Urobilinogen, UA: 0.2 (ref 0.0–1.0)
pH: 6 (ref 5.0–8.0)

## 2022-06-23 LAB — VITAMIN D 25 HYDROXY (VIT D DEFICIENCY, FRACTURES): VITD: 34.67 ng/mL (ref 30.00–100.00)

## 2022-06-23 MED ORDER — SIMVASTATIN 40 MG PO TABS: 40.0000 mg | ORAL_TABLET | Freq: Every evening | ORAL | 3 refills | Status: DC

## 2022-06-23 NOTE — Patient Instructions (Addendum)
TO PREVENT REFLUX:  1) DO NOT EAT WITHIN 2 HOURS OF LYING DOWN  2) AVOID CHOCOLATE,  ALCOHOL AND MINT  AFTER DINNER:  ALL WILL  CAUSE REFLUX   YOU HAVE OSTEOPENIA BASED ON YOUR LAST DEXA SCAN IN 2021     I recommend getting the majority of your calcium through diet rather than supplements given the recent association of calcium supplements with increased coronary artery calcium scores.  You need 1200 mg calcium daily and 1000 IUs of Vit D (more if your level is low today) if you have osteopenia  Try using Tums,  Protein drinks (like Ensure,  Premier Protein, or Muscle Milk), soy,  almond and cashew milks which are fortified and carried by  most grocery stores   in the dairy  Section.   They are lactose free.  Soy milk should not be used if you have a history of breast cancer.   If you want to make your own protein drink, try the one made by  [facility] .  It comes in vanilla, chocolate and salted caramel ,  And is  plant based.  You can  Mix it with one of the non dairy milks,  And add PB2 peanut powder  (peanut butter 90" of the fat removed)  For a great breakfast    Your thigh muscles have gotten weak:  Practice getting out of a chair WITHOUT USING YOUR HANDS TO PUSH OFF

## 2022-06-23 NOTE — Progress Notes (Signed)
Patient ID: Lori Caldwell, female    DOB: August 25, 1940  Age: 82 y.o. MRN: [id number]  The patient is here for FOLLOW UP AND management of other chronic and acute problems.   The risk factors are reflected in the social history.  The roster of all physicians providing medical care to patient - is listed in the Snapshot section of the chart.  Activities of daily living:  The patient is 100% independent in all ADLs: dressing, toileting, feeding as well as independent mobility  Home safety : The patient has smoke detectors in the home. They wear seatbelts.  There are no firearms at home. There is no violence in the home.   There is no risks for hepatitis, STDs or HIV. There is no   history of blood transfusion. They have no travel history to infectious disease endemic areas of the world.  The patient has seen their dentist in the last six month. They have seen their eye doctor in the last year. They admit to slight hearing difficulty with regard to whispered voices and some television programs.  They have deferred audiologic testing in the last year.  They do not  have excessive sun exposure. Discussed the need for sun protection: hats, long sleeves and use of sunscreen if there is significant sun exposure.   Diet: the importance of a healthy diet is discussed. They do have a healthy diet.  The benefits of regular aerobic exercise were discussed. She walks 4 times per week ,  20 minutes.   Depression screen: there are no signs or vegative symptoms of depression- irritability, change in appetite, anhedonia, sadness/tearfullness.  Cognitive assessment: the patient manages all their financial and personal affairs and is actively engaged. They could relate day,date,year and events; recalled 2/3 objects at 3 minutes; performed clock-face test normally.  The following portions of the patient's history were reviewed and updated as appropriate: allergies, current medications, past family history, past  medical history,  past surgical history, past social history  and problem list.  Visual acuity was not assessed per patient preference since she has regular follow up with her ophthalmologist. Hearing and body mass index were assessed and reviewed.   During the course of the visit the patient was educated and counseled about appropriate screening and preventive services including : fall prevention , diabetes screening, nutrition counseling, colorectal cancer screening, and recommended immunizations.    CC: The primary encounter diagnosis was Colon cancer screening. Diagnoses of Breast cancer screening by mammogram, Osteopenia due to cancer therapy, Vitamin D deficiency, Nocturia more than twice per night, Mixed hyperlipidemia, Dizzy, Constipation, unspecified constipation type, and Personal history of breast cancer were also pertinent to this visit.   1)Aortic atherosclerosis:   Patient  has been taking simvastatin since 2012,   for risk reduction and  stabilization of atherosclerotic placque noted on prior abdominal CT which was reviewed during today's visit  .    2) Constipation:  seen 3 months ago.  Glycerin suppositories added for rectal pain with defecation and continued [place] of benefiber bid advised  3) one recent episode of waking up with esophagitis after eating late at night    History O[place] has a past medical history of Breast cancer ([place]) (2000), GERD (gastroesophageal reflux disease), Hyperlipidemia, Personal history of chemotherapy (2000), and Personal history of radiation therapy (2000).   She has a past surgical history that includes Breast surgery; Mastectomy (Right, 2000); Robotic assisted total hysterectomy (06/16/2021); and Colonoscopy with propofol (N/A, 02/10/2022).   Her  family history includes Cancer in her maternal grandmother; Heart disease in her father; Hypertension in her mother.She reports that she has never smoked. She has never used smokeless tobacco. She  reports current alcohol use. She reports that she does not use drugs.  Outpatient Medications Prior to Visit  Medication Sig Dispense Refill   aspirin 81 MG tablet Take 81 mg by mouth daily.     Cholecalciferol 25 MCG (1000 UT) tablet Take by mouth.     Docusate Sodium (DSS) 100 MG CAPS Take 2 capsules by mouth at bedtime.     Multiple Vitamin (MULTIVITAMIN) tablet Take 1 tablet by mouth daily.     Wheat Dextrin (BENEFIBER DRINK MIX PO) Take 1 Package by mouth 2 (two) times daily.     hydrocortisone 2.5 % cream Apply topically 2 (two) times daily. (Patient not taking: Reported on 03/22/2022) 28 g 5   simvastatin (ZOCOR) 40 MG tablet Take 1 tablet (40 mg total) by mouth every evening. 90 tablet 3   No facility-administered medications prior to visit.    Review of Systems  Patient denies headache, fevers, malaise, unintentional weight loss, skin rash, eye pain, sinus congestion and sinus pain, sore throat, dysphagia,  hemoptysis , cough, dyspnea, wheezing, chest pain, palpitations, orthopnea, edema, abdominal pain, nausea, melena, diarrhea, constipation, flank pain, dysuria, hematuria, urinary  Frequency, nocturia, numbness, tingling, seizures,  Focal weakness, Loss of consciousness,  Tremor, insomnia, depression, anxiety, and suicidal ideation.    Objective:  BP 126/74   Pulse 80   Ht 5' 7"$  (1.702 m)   Wt 165 lb 12.8 oz (75.2 kg)   SpO2 96%   BMI 25.97 kg/m   Physical Exam Vitals reviewed.  Constitutional:      General: She is not in acute distress.    Appearance: Normal appearance. She is normal weight. She is not ill-appearing, toxic-appearing or diaphoretic.  HENT:     Head: Normocephalic.     Right Ear: Tympanic membrane normal.     Left Ear: Tympanic membrane normal.  Eyes:     General: No scleral icterus.       Right eye: No discharge.        Left eye: No discharge.     Conjunctiva/sclera: Conjunctivae normal.  Cardiovascular:     Rate and Rhythm: Normal rate and  regular rhythm.     Heart sounds: Normal heart sounds.  Pulmonary:     Effort: Pulmonary effort is normal. No respiratory distress.     Breath sounds: Normal breath sounds.  Abdominal:     General: Abdomen is flat. Bowel sounds are normal.     Palpations: Abdomen is soft.  Musculoskeletal:        General: Normal range of motion.  Skin:    General: Skin is warm and dry.  Neurological:     General: No focal deficit present.     Mental Status: She is alert and oriented to person, place, and time. Mental status is at baseline.  Psychiatric:        Mood and Affect: Mood normal.        Behavior: Behavior normal.        Thought Content: Thought content normal.        Judgment: Judgment normal.     Assessment & Plan:  Colon cancer screening Assessment & Plan: Positive cologuard Sept 2023.  Normal diagnostic colonscopy Oct 2023 [facility]) except for tics and internal hemorrhoids    Breast cancer screening by mammogram -  3D Screening Mammogram, Left; Future  Osteopenia due to cancer therapy Assessment & Plan: TAKING VITAMIN D DAILY DOSE UNKNOWN 1000 IUS?  Repeat should be done     Vitamin D deficiency -     VITAMIN D 25 Hydroxy (Vit-D Deficiency, Fractures); Future  Nocturia more than twice per night -     Urinalysis, Routine w reflex microscopic; Future -     Urine Culture; Future  Mixed hyperlipidemia Assessment & Plan: Managed with zocor.  LDL and triglycerides were  at goal on current medication s .  LFTs are normal.     Lab Results  Component Value Date   CHOL 182 10/09/2021   HDL 72.50 10/09/2021   LDLCALC 78 10/09/2021   LDLDIRECT 117.8 06/25/2013   TRIG 154.0 (H) 10/09/2021   CHOLHDL 3 10/09/2021     Orders: -     Lipid Panel w/reflex Direct LDL -     TSH  Dizzy -     CBC with Differential/Platelet  Constipation, unspecified constipation type Assessment & Plan: Chronic for 1.5 years  colonoscopy unrevealing.  Continue benefier  bid,  colace daily  and add glycerin suppositories to ease passage of large stools   Orders: -     Comprehensive metabolic panel  Personal history of breast cancer Assessment & Plan:  Continue annual screening mammograms of left breast    Other orders -     Simvastatin; Take 1 tablet (40 mg total) by mouth every evening.  Dispense: 90 tablet; Refill: 3      I provided 40 minutes of  face-to-face time during this encounter reviewing patient's current problems and past surgeries,  recent labs and imaging studies, providing counseling on the above mentioned problems , and coordination  of care .   Follow-up: Return in about 6 months (around 12/22/2022).   Crecencio Mc, MD

## 2022-06-23 NOTE — Assessment & Plan Note (Signed)
Managed with zocor.  LDL and triglycerides were  at goal on current medication s .  LFTs are normal.     Lab Results  Component Value Date   CHOL 182 10/09/2021   HDL 72.50 10/09/2021   LDLCALC 78 10/09/2021   LDLDIRECT 117.8 06/25/2013   TRIG 154.0 (H) 10/09/2021   CHOLHDL 3 10/09/2021

## 2022-06-23 NOTE — Assessment & Plan Note (Signed)
Chronic for 1.5 years  colonoscopy unrevealing.  Continue benefier  bid,  colace daily and add glycerin suppositories to ease passage of large stools

## 2022-06-23 NOTE — Assessment & Plan Note (Signed)
Continue annual screening mammograms of left breast

## 2022-06-23 NOTE — Assessment & Plan Note (Addendum)
TAKING VITAMIN D DAILY DOSE UNKNOWN 1000 IUS?  Repeat should be done

## 2022-06-23 NOTE — Assessment & Plan Note (Addendum)
Positive cologuard Sept 2023.  Normal diagnostic colonscopy Oct 2023 [facility]) except for tics and internal hemorrhoids

## 2022-06-24 LAB — COMPREHENSIVE METABOLIC PANEL
ALT: 20 U/L (ref 0–35)
AST: 25 U/L (ref 0–37)
Albumin: 4.8 g/dL (ref 3.5–5.2)
Alkaline Phosphatase: 62 U/L (ref 39–117)
BUN: 15 mg/dL (ref 6–23)
CO2: 24 mEq/L (ref 19–32)
Calcium: 10.4 mg/dL (ref 8.4–10.5)
Chloride: 104 mEq/L (ref 96–112)
Creatinine, Ser: 0.87 mg/dL (ref 0.40–1.20)
GFR: 62.81 mL/min (ref >60.00)
Glucose, Bld: 89 mg/dL (ref 70–99)
Potassium: 4.6 mEq/L (ref 3.5–5.1)
Sodium: 142 mEq/L (ref 135–145)
Total Bilirubin: 0.7 mg/dL (ref 0.2–1.2)
Total Protein: 7.7 g/dL (ref 6.0–8.3)

## 2022-06-24 LAB — LIPID PANEL W/REFLEX DIRECT LDL
Cholesterol: 212 mg/dL — ABNORMAL HIGH (ref <200)
HDL: 82 mg/dL (ref >=50)
LDL Cholesterol (Calc): 103 mg/dL (calc) — ABNORMAL HIGH
Non-HDL Cholesterol (Calc): 130 mg/dL (calc) — ABNORMAL HIGH (ref <130)
Total CHOL/HDL Ratio: 2.6 (calc) (ref <5.0)
Triglycerides: 159 mg/dL — ABNORMAL HIGH (ref <150)

## 2022-06-24 LAB — URINE CULTURE
MICRO NUMBER:: 14595290
SPECIMEN QUALITY:: ADEQUATE

## 2022-07-05 DIAGNOSIS — H02834 Dermatochalasis of left upper eyelid: Secondary | ICD-10-CM | POA: Diagnosis not present

## 2022-07-05 DIAGNOSIS — Z961 Presence of intraocular lens: Secondary | ICD-10-CM | POA: Diagnosis not present

## 2022-07-18 ENCOUNTER — Encounter: Payer: Self-pay | Admitting: Internal Medicine

## 2022-07-20 ENCOUNTER — Ambulatory Visit: Payer: Medicare Other | Admitting: Family

## 2022-07-20 ENCOUNTER — Ambulatory Visit
Admission: EM | Admit: 2022-07-20 | Discharge: 2022-07-20 | Disposition: A | Discharge status: Home / Self Care | Payer: Medicare Other | Attending: Emergency Medicine | Admitting: Emergency Medicine

## 2022-07-20 DIAGNOSIS — S81811A Laceration without foreign body, right lower leg, initial encounter: Secondary | ICD-10-CM | POA: Diagnosis not present

## 2022-07-20 MED ORDER — CEPHALEXIN 500 MG PO CAPS: 500.0000 mg | ORAL_CAPSULE | Freq: Two times a day (BID) | ORAL | 0 refills | Status: AC

## 2022-07-20 NOTE — Progress Notes (Deleted)
Verbal consent for services obtained from patient prior to services given to TELEPHONE visit:   Location of call:  provider at work patient at home  Names of all persons present for services: Mable Paris, NP and patient  She is on aspirin 81 mg H/o BCC   A/P/next steps:  There are no diagnoses linked to this encounter.   Advised patient AVS could be mailed or viewed over [place] if [place] user.   I spent 15 min  discussing plan of care over the phone.

## 2022-07-20 NOTE — ED Triage Notes (Signed)
Patient presents to [facility] for wound check. She states she cut her right lower leg against furniture on Friday. Cleansing site, applying neosporin and Vaseline. Tdap up to date.   Denies fever.

## 2022-07-20 NOTE — Discharge Instructions (Addendum)
As we discussed, you have been doing the right measures to your leg though I would stop applying Vaseline as the only it will keep the wound from forming a scab and healing properly.  When you are at home please leave the wound open to the air so that it can dry out and scab conform.  You can cover it with a nonadherent dressing when you go out in public or when you go to bed at night until a scab is formed.  Once his good scab is formed you should not have to worry where dressing to bed.  Please take the Keflex that I prescribed to prevent infection.  You will take it twice daily with food for 5 days.  If you develop any increased redness, swelling at site, pain at the site, pus drainage from site, red streaks going up your leg, or fever you need to return for reevaluation.

## 2022-07-20 NOTE — ED Provider Notes (Signed)
[facility]    CSN: [id number] Arrival date & time: 07/20/22  1512      History   Chief Complaint Chief Complaint  Patient presents with   Wound Infection    HPI Lori Caldwell is a 82 y.o. female.   HPI  Female with a significant past medical history for hyperlipidemia, breast cancer, GERD, and right mastectomy presenting for evaluation of a wound on her right lower leg.  She sustained a cut 4 days ago and would like to have it looked at.  She states her Tdap is up-to-date.  She has been a applying Neosporin and Vaseline to the site and keeping it clean.  Past Medical History:  Diagnosis Date   Breast cancer ([place]) 2000   RT MASTECTOMY   GERD (gastroesophageal reflux disease)    Hyperlipidemia    Personal history of chemotherapy 2000   BREAST CA   Personal history of radiation therapy 2000   BREAST CA    Patient Active Problem List   Diagnosis Date Noted   Cellulitis of left lower limb 11/30/2021   Pelvic mass in female 05/11/2021   Ovarian cyst 04/29/2021   Constipation 11/21/2020   Basal cell carcinoma of chin 06/24/2020   S/P right mastectomy 06/23/2020   Allergic rhinitis 07/28/2017   Colon cancer screening 03/14/2017   History of syncope 01/16/2015   Medicare annual wellness visit, subsequent 12/24/2012   Osteopenia due to cancer therapy 10/22/2011   Personal history of breast cancer 04/13/2011   Screening for cervical cancer 04/13/2011   Hyperlipidemia    GERD (gastroesophageal reflux disease)     Past Surgical History:  Procedure Laterality Date   BREAST SURGERY     mastectomy, left breast    COLONOSCOPY WITH PROPOFOL N/A 02/10/2022   Procedure: COLONOSCOPY WITH PROPOFOL;  Surgeon: Toledo, Benay Pike, MD;  Location: [facility] ENDOSCOPY;  Service: Gastroenterology;  Laterality: N/A;   MASTECTOMY Right 2000   BREAST CA   ROBOTIC ASSISTED TOTAL HYSTERECTOMY  06/16/2021    OB History   No obstetric history on file.      Home  Medications    Prior to Admission medications   Medication Sig Start Date End Date Taking? Authorizing Provider  cephALEXin (KEFLEX) 500 MG capsule Take 1 capsule (500 mg total) by mouth 2 (two) times daily for 5 days. 07/20/22 07/25/22 Yes Margarette Canada, NP  aspirin 81 MG tablet Take 81 mg by mouth daily.    [provider]  Cholecalciferol 25 MCG (1000 UT) tablet Take by mouth.    [provider]  Docusate Sodium (DSS) 100 MG CAPS Take 2 capsules by mouth at bedtime.    [provider]  Multiple Vitamin (MULTIVITAMIN) tablet Take 1 tablet by mouth daily.    [provider]  simvastatin (ZOCOR) 40 MG tablet Take 1 tablet (40 mg total) by mouth every evening. 06/23/22   Crecencio Mc, MD  Wheat Dextrin (BENEFIBER DRINK MIX PO) Take 1 Package by mouth 2 (two) times daily.    [provider]    Family History Family History  Problem Relation Age of Onset   Hypertension Mother    Heart disease Father    Cancer Maternal Grandmother    Breast cancer Neg Hx     Social History Social History   Tobacco Use   Smoking status: Never   Smokeless tobacco: Never  Vaping Use   Vaping Use: Never used  Substance Use Topics   Alcohol use: Yes  Comment: OCC   Drug use: No     Allergies   Patient has no known allergies.   Review of Systems Review of Systems  Constitutional:  Negative for fever.  Skin:  Positive for color change and wound.     Physical Exam Triage Vital Signs ED Triage Vitals  Enc Vitals Group     BP 07/20/22 1700 (!) 147/71     Pulse Rate 07/20/22 1700 79     Resp 07/20/22 1700 16     Temp 07/20/22 1700 98.4 F (36.9 C)     Temp Source 07/20/22 1700 Oral     SpO2 07/20/22 1700 98 %     Weight --      Height --      Head Circumference --      Peak Flow --      Pain Score 07/20/22 1701 0     Pain Loc --      Pain Edu? --      Excl. in [place]? --    No data found.  Updated Vital Signs BP (!) 147/71 (BP Location:  Left Arm)   Pulse 79   Temp 98.4 F (36.9 C) (Oral)   Resp 16   SpO2 98%   Visual Acuity Right Eye Distance:   Left Eye Distance:   Bilateral Distance:    Right Eye Near:   Left Eye Near:    Bilateral Near:     Physical Exam Vitals and nursing note reviewed.  Constitutional:      Appearance: Normal appearance. She is not ill-appearing.  Skin:    General: Skin is warm and dry.     Capillary Refill: Capillary refill takes less than 2 seconds.     Findings: Bruising and erythema present.  Neurological:     General: No focal deficit present.     Mental Status: She is alert and oriented to person, place, and time.      UC Treatments / Results  Labs (all labs ordered are listed, but only abnormal results are displayed) Labs Reviewed - No data to display  EKG   Radiology No results found.  Procedures Procedures (including critical care time)  Medications Ordered in UC Medications - No data to display  Initial Impression / Assessment and Plan / UC Course  I have reviewed the triage vital signs and the nursing notes.  Pertinent labs & imaging results that were available during my care of the patient were reviewed by me and considered in my medical decision making (see chart for details).   The patient is a pleasant, nontoxic-appearing 82 year old female here for evaluation of a wound on her right shin that she sustained when she clipped a piece of furniture 4 days ago.  She states that it did bleed successively for several days but that has since stopped.  She has been keeping the area clean, dry, and applying Neosporin and Vaseline.  She has a nonstick dressing in place.  She states she has not seen any pus and she denies fever.  Her tetanus shot was updated 6 weeks ago.  Patient seen image above there is a small skin tear that is in the process of healing.  There is no active bleeding from the site and the skin flap is well adhered over the tissue defect.  The brown  discoloration that she was seeing the patient reports is from a previous injury years ago.  There are some mild ecchymosis to the skin flap.  I  have advised the patient to stop applying emollients and to leave it open to air when she is at home.  She can cover with a dressing when she goes to bed at night and also when she was out in public.  I will discharge her home on Keflex 500 mg twice daily for 5 days as prophylaxis against infection.  Final Clinical Impressions(s) / UC Diagnoses   Final diagnoses:  Skin tear of right lower leg without complication, initial encounter     Discharge Instructions      As we discussed, you have been doing the right measures to your leg though I would stop applying Vaseline as the only it will keep the wound from forming a scab and healing properly.  When you are at home please leave the wound open to the air so that it can dry out and scab conform.  You can cover it with a nonadherent dressing when you go out in public or when you go to bed at night until a scab is formed.  Once his good scab is formed you should not have to worry where dressing to bed.  Please take the Keflex that I prescribed to prevent infection.  You will take it twice daily with food for 5 days.  If you develop any increased redness, swelling at site, pain at the site, pus drainage from site, red streaks going up your leg, or fever you need to return for reevaluation.     ED Prescriptions     Medication Sig Dispense Auth. Provider   cephALEXin (KEFLEX) 500 MG capsule Take 1 capsule (500 mg total) by mouth 2 (two) times daily for 5 days. 10 capsule Margarette Canada, NP      PDMP not reviewed this encounter.   Margarette Canada, NP 07/20/22 1722

## 2022-07-22 ENCOUNTER — Encounter: Payer: Self-pay | Admitting: Internal Medicine

## 2022-08-26 DIAGNOSIS — Z85828 Personal history of other malignant neoplasm of skin: Secondary | ICD-10-CM | POA: Diagnosis not present

## 2022-08-26 DIAGNOSIS — L812 Freckles: Secondary | ICD-10-CM | POA: Diagnosis not present

## 2022-08-26 DIAGNOSIS — D225 Melanocytic nevi of trunk: Secondary | ICD-10-CM | POA: Diagnosis not present

## 2022-08-26 DIAGNOSIS — L570 Actinic keratosis: Secondary | ICD-10-CM | POA: Diagnosis not present

## 2022-08-26 DIAGNOSIS — L821 Other seborrheic keratosis: Secondary | ICD-10-CM | POA: Diagnosis not present

## 2022-08-26 DIAGNOSIS — L720 Epidermal cyst: Secondary | ICD-10-CM | POA: Diagnosis not present

## 2022-08-26 DIAGNOSIS — B078 Other viral warts: Secondary | ICD-10-CM | POA: Diagnosis not present

## 2022-09-13 DIAGNOSIS — H903 Sensorineural hearing loss, bilateral: Secondary | ICD-10-CM | POA: Diagnosis not present

## 2022-09-13 DIAGNOSIS — H6123 Impacted cerumen, bilateral: Secondary | ICD-10-CM | POA: Diagnosis not present

## 2022-10-12 ENCOUNTER — Encounter: Payer: Self-pay | Admitting: Internal Medicine

## 2022-10-12 DIAGNOSIS — L578 Other skin changes due to chronic exposure to nonionizing radiation: Secondary | ICD-10-CM | POA: Diagnosis not present

## 2022-10-12 DIAGNOSIS — L821 Other seborrheic keratosis: Secondary | ICD-10-CM | POA: Diagnosis not present

## 2022-10-12 DIAGNOSIS — R238 Other skin changes: Secondary | ICD-10-CM | POA: Diagnosis not present

## 2022-10-12 DIAGNOSIS — Z85828 Personal history of other malignant neoplasm of skin: Secondary | ICD-10-CM | POA: Diagnosis not present

## 2022-11-01 ENCOUNTER — Ambulatory Visit
Admission: RE | Admit: 2022-11-01 | Discharge: 2022-11-01 | Disposition: A | Discharge status: Home / Self Care | Payer: Medicare Other | Source: Ambulatory Visit | Attending: Internal Medicine | Admitting: Internal Medicine

## 2022-11-01 DIAGNOSIS — Z1231 Encounter for screening mammogram for malignant neoplasm of breast: Secondary | ICD-10-CM | POA: Insufficient documentation

## 2022-11-30 DIAGNOSIS — J028 Acute pharyngitis due to other specified organisms: Secondary | ICD-10-CM | POA: Diagnosis not present

## 2022-12-07 ENCOUNTER — Encounter: Payer: Self-pay | Admitting: Nurse Practitioner

## 2022-12-07 ENCOUNTER — Ambulatory Visit (INDEPENDENT_AMBULATORY_CARE_PROVIDER_SITE_OTHER): Payer: Medicare Other | Admitting: Nurse Practitioner

## 2022-12-07 VITALS — BP 118/74 | HR 88 | Temp 98.0°F | Ht 67.0 in | Wt 164.6 lb

## 2022-12-07 DIAGNOSIS — J069 Acute upper respiratory infection, unspecified: Secondary | ICD-10-CM | POA: Diagnosis not present

## 2022-12-07 DIAGNOSIS — R058 Other specified cough: Secondary | ICD-10-CM | POA: Diagnosis not present

## 2022-12-07 MED ORDER — HYDROCOD POLI-CHLORPHE POLI ER 10-8 MG/5ML PO SUER: 5.0000 mL | Freq: Every evening | ORAL | 0 refills | Status: DC | PRN

## 2022-12-07 MED ORDER — AMOXICILLIN-POT CLAVULANATE 875-125 MG PO TABS: 1.0000 | ORAL_TABLET | Freq: Two times a day (BID) | ORAL | 0 refills | Status: DC

## 2022-12-07 MED ORDER — PREDNISONE 20 MG PO TABS: 20.0000 mg | ORAL_TABLET | Freq: Every day | ORAL | 0 refills | Status: DC

## 2022-12-07 NOTE — Patient Instructions (Addendum)
Stop Amoxocillin. Take Augmentin twice a day for 10 days  and prednisone in the morning  for 5 days. Take cough syrup at bed time. Can take mucinex for cough at day time. Increase fluid in take.

## 2022-12-07 NOTE — Progress Notes (Unsigned)
Established Patient Office Visit  Subjective:  Patient ID: Lori Caldwell, female    DOB: 07-09-1940  Age: 82 y.o. MRN: [id number]  CC:  Chief Complaint  Patient presents with   Acute Visit    Cold, cough    HPI  Lori Caldwell presents for URI.   URI  This is a new problem. The current episode started 1 to 4 weeks ago. The problem has been gradually worsening. There has been no fever. Associated symptoms include congestion (nasal  and chest congestion), headaches, rhinorrhea, sneezing and a sore throat. Pertinent negatives include no ear pain or wheezing. She has tried acetaminophen (amoxicillin) for the symptoms.  Went to UC last Tuesday tested negative for COVID and strepwas prescribed amoxicillin. Still not doing better. Sorethroat and cough nor letting her sleep at night.   Past Medical History:  Diagnosis Date   Breast cancer ([facility]) 2000   RT MASTECTOMY   GERD (gastroesophageal reflux disease)    Hyperlipidemia    Personal history of chemotherapy 2000   BREAST CA   Personal history of radiation therapy 2000   BREAST CA    Past Surgical History:  Procedure Laterality Date   BREAST SURGERY     mastectomy, left breast    COLONOSCOPY WITH PROPOFOL N/A 02/10/2022   Procedure: COLONOSCOPY WITH PROPOFOL;  Surgeon: Toledo, Boykin Nearing, MD;  Location: [facility] END[facility];  Service: Gastroenterology;  Laterality: N/A;   MASTECTOMY Right 2000   BREAST CA   ROBOTIC ASSISTED TOTAL HYSTERECTOMY  06/16/2021    Family History  Problem Relation Age of Onset   Hypertension Mother    Heart disease Father    Cancer Maternal Grandmother    Breast cancer Neg Hx     Social History   Socioeconomic History   Marital status: Married    Spouse name: Annette Stable   Number of children: Not on file   Years of education: Not on file   Highest education level: Bachelor's degree (e.g., BA, AB, BS)  Occupational History   Not on file  Tobacco Use   Smoking status: Never   Smokeless tobacco:  Never  Vaping Use   Vaping status: Never Used  Substance and Sexual Activity   Alcohol use: Yes    Comment: OCC   Drug use: No   Sexual activity: Not on file  Other Topics Concern   Not on file  Social History Narrative   Married    Lawyer    Social Determinants of Health   Financial Resource Strain: Low Risk  (03/29/2022)   Overall Financial Resource Strain (CARDIA)    Difficulty of Paying Living Expenses: Not hard at all  Food Insecurity: No Food Insecurity (03/29/2022)   Hunger Vital Sign    Worried About Running Out of Food in the Last Year: Never true    Ran Out of Food in the Last Year: Never true  Transportation Needs: No Transportation Needs (03/29/2022)   PRAPARE - Administrator, Civil Service (Medical): No    Lack of Transportation (Non-Medical): No  Physical Activity: Sufficiently Active (03/29/2022)   Exercise Vital Sign    Days of Exercise per Week: 5 days    Minutes of Exercise per Session: 30 min  Stress: No Stress Concern Present (03/29/2022)   [employer] of Occupational Health - Occupational Stress Questionnaire    Feeling of Stress : Not at all  Social Connections: Socially Integrated (03/29/2022)   Social Connection and Isolation Panel [NHANES]  Frequency of Communication with Friends and Family: More than three times a week    Frequency of Social Gatherings with Friends and Family: More than three times a week    Attends Religious Services: 1 to 4 times per year    Active Member of [employer] or Organizations: Yes    Attends Banker Meetings: 1 to 4 times per year    Marital Status: Married  Catering manager Violence: Not At Risk (03/29/2022)   Humiliation, Afraid, Rape, and Kick questionnaire    Fear of Current or Ex-Partner: No    Emotionally Abused: No    Physically Abused: No    Sexually Abused: No     Outpatient Medications Prior to Visit  Medication Sig Dispense Refill   aspirin 81 MG tablet Take  81 mg by mouth daily.     Cholecalciferol 25 MCG (1000 UT) tablet Take by mouth.     Docusate Sodium (DSS) 100 MG CAPS Take 2 capsules by mouth at bedtime.     Multiple Vitamin (MULTIVITAMIN) tablet Take 1 tablet by mouth daily.     simvastatin (ZOCOR) 40 MG tablet Take 1 tablet (40 mg total) by mouth every evening. 90 tablet 3   Wheat Dextrin (BENEFIBER DRINK MIX PO) Take 1 Package by mouth 2 (two) times daily.     No facility-administered medications prior to visit.    No Known Allergies  ROS Review of Systems  HENT:  Positive for congestion (nasal  and chest congestion), rhinorrhea, sneezing and sore throat. Negative for ear pain.   Respiratory:  Negative for wheezing.   Neurological:  Positive for headaches.   Negative unless indicated in HPI.    Objective:    Physical Exam HENT:     Right Ear: Tympanic membrane normal. Tympanic membrane is not erythematous.     Left Ear: Tympanic membrane normal. Tympanic membrane is not erythematous.     Nose:     Right Turbinates: Not enlarged.     Left Turbinates: Not enlarged.     Right Sinus: No maxillary sinus tenderness or frontal sinus tenderness.     Left Sinus: No maxillary sinus tenderness or frontal sinus tenderness.     Mouth/Throat:     Mouth: Mucous membranes are moist.     Pharynx: No pharyngeal swelling, oropharyngeal exudate or posterior oropharyngeal erythema.     Tonsils: No tonsillar exudate.  Cardiovascular:     Rate and Rhythm: Normal rate and regular rhythm.  Pulmonary:     Effort: Pulmonary effort is normal.     Breath sounds: Normal breath sounds. No stridor. No wheezing.  Neurological:     General: No focal deficit present.     Mental Status: She is oriented to person, place, and time. Mental status is at baseline.  Psychiatric:        Mood and Affect: Mood normal.        Behavior: Behavior normal.        Thought Content: Thought content normal.        Judgment: Judgment normal.     BP 118/74    Pulse 88   Temp 98 F (36.7 C)   Ht 5\' 7"  (1.702 m)   Wt 164 lb 9.6 oz (74.7 kg)   SpO2 98%   BMI 25.78 kg/m  Wt Readings from Last 3 Encounters:  12/07/22 164 lb 9.6 oz (74.7 kg)  06/23/22 165 lb 12.8 oz (75.2 kg)  03/29/22 164 lb (74.4 kg)  Health Maintenance  Topic Date Due   COVID-19 Vaccine (6 - 2023-24 season) 01/01/2022   INFLUENZA VACCINE  08/01/2023 (Originally 12/02/2022)   Medicare Annual Wellness (AWV)  03/30/2023   DTaP/Tdap/Td (3 - Td or Tdap) 04/06/2032   Pneumonia Vaccine 48+ Years old  Completed   DEXA SCAN  Completed   Zoster Vaccines- Shingrix  Completed   HPV VACCINES  Aged Out   Hepatitis C Screening  Discontinued    There are no preventive care reminders to display for this patient.  Lab Results  Component Value Date   TSH 3.70 06/23/2022   Lab Results  Component Value Date   WBC 5.2 06/23/2022   HGB 15.1 (H) 06/23/2022   HCT 45.3 06/23/2022   MCV 99.8 06/23/2022   PLT 258.0 06/23/2022   Lab Results  Component Value Date   NA 142 06/23/2022   K 4.6 06/23/2022   CO2 24 06/23/2022   GLUCOSE 89 06/23/2022   BUN 15 06/23/2022   CREATININE 0.87 06/23/2022   BILITOT 0.7 06/23/2022   ALKPHOS 62 06/23/2022   AST 25 06/23/2022   ALT 20 06/23/2022   PROT 7.7 06/23/2022   ALBUMIN 4.8 06/23/2022   CALCIUM 10.4 06/23/2022   GFR 62.81 06/23/2022   Lab Results  Component Value Date   CHOL 212 (H) 06/23/2022   Lab Results  Component Value Date   HDL 82 06/23/2022   Lab Results  Component Value Date   LDLCALC 103 (H) 06/23/2022   Lab Results  Component Value Date   TRIG 159 (H) 06/23/2022   Lab Results  Component Value Date   CHOLHDL 2.6 06/23/2022   No results found for: "HGBA1C"    Assessment & Plan:  There are no diagnoses linked to this encounter.  Follow-up: No follow-ups on file.   Kara Dies, NP

## 2022-12-08 ENCOUNTER — Encounter: Payer: Self-pay | Admitting: Nurse Practitioner

## 2022-12-08 DIAGNOSIS — J069 Acute upper respiratory infection, unspecified: Secondary | ICD-10-CM | POA: Insufficient documentation

## 2022-12-08 NOTE — Assessment & Plan Note (Signed)
Discontinued amoxicillin. Will treat with Augmentin, prednisone and OTC antihistamine Advised adequate hydration. Use salt water gargles.

## 2022-12-08 NOTE — Assessment & Plan Note (Signed)
Will treat with Tussionex. Advised patient to take the medication at bedtime as it may cause drowsiness. Medication side effect discussed.

## 2022-12-18 ENCOUNTER — Ambulatory Visit
Admission: EM | Admit: 2022-12-18 | Discharge: 2022-12-18 | Disposition: A | Discharge status: Home / Self Care | Payer: Medicare Other | Attending: Internal Medicine | Admitting: Internal Medicine

## 2022-12-18 ENCOUNTER — Encounter: Payer: Self-pay | Admitting: Emergency Medicine

## 2022-12-18 DIAGNOSIS — T7840XA Allergy, unspecified, initial encounter: Secondary | ICD-10-CM | POA: Diagnosis not present

## 2022-12-18 DIAGNOSIS — S60562A Insect bite (nonvenomous) of left hand, initial encounter: Secondary | ICD-10-CM | POA: Diagnosis not present

## 2022-12-18 DIAGNOSIS — W57XXXA Bitten or stung by nonvenomous insect and other nonvenomous arthropods, initial encounter: Secondary | ICD-10-CM

## 2022-12-18 MED ORDER — METHYLPREDNISOLONE SODIUM SUCC 40 MG IJ SOLR: 40.0000 mg | Freq: Once | INTRAMUSCULAR | Status: DC

## 2022-12-18 MED ORDER — METHYLPREDNISOLONE 4 MG PO TBPK: ORAL_TABLET | ORAL | 0 refills | Status: DC

## 2022-12-18 MED ORDER — METHYLPREDNISOLONE ACETATE 80 MG/ML IJ SUSP
80.0000 mg | Freq: Once | INTRAMUSCULAR | Status: AC
  Administered 2022-12-18: 80 mg via INTRAMUSCULAR

## 2022-12-18 NOTE — ED Triage Notes (Signed)
Patient states that she got stung yesterday while she was outside.  Patient has redness and swelling in her left hand and states that this morning the redness and swelling has gone up her left forearm.  Patient has been taking Benadryl.

## 2022-12-18 NOTE — Discharge Instructions (Addendum)
Steroid injection given today. Site of injection will be tender to touch. Start steroid dose pack taper and follow instructions on package. Return to ED if developing shortness of breat or facial swelling. Follow up at urgent care as needed.

## 2022-12-18 NOTE — ED Provider Notes (Addendum)
[facility]    CSN: [id number] Arrival date & time: 12/18/22  1027      History   Chief Complaint Chief Complaint  Patient presents with   Insect Bite    HPI JOLIN KAIN is a 82 y.o. female.   82 year old female who was working in her yard yesterday with gloves on and felt an insect bite.  She took the gloves off and went in and immediately applied Benadryl cream and took a Benadryl.  Her hand started swelling that night and has continued to swell.  The area is very tender.  She denies any anaphylactic reactions but is very hypersensitive to poison ivy.  She denies any shortness of breath, chest pain, fevers, chills.  She is having no facial swelling.        Past Medical History:  Diagnosis Date   Breast cancer (HCC) 2000   RT MASTECTOMY   GERD (gastroesophageal reflux disease)    Hyperlipidemia    Personal history of chemotherapy 2000   BREAST CA   Personal history of radiation therapy 2000   BREAST CA    Patient Active Problem List   Diagnosis Date Noted   Upper respiratory tract infection 12/08/2022   Cellulitis of left lower limb 11/30/2021   Pelvic mass in female 05/11/2021   Ovarian cyst 04/29/2021   Constipation 11/21/2020   Basal cell carcinoma of chin 06/24/2020   S/P right mastectomy 06/23/2020   Allergic rhinitis 07/28/2017   Colon cancer screening 03/14/2017   History of syncope 01/16/2015   Cough 04/01/2014   Medicare annual wellness visit, subsequent 12/24/2012   Osteopenia due to cancer therapy 10/22/2011   Personal history of breast cancer 04/13/2011   Screening for cervical cancer 04/13/2011   Hyperlipidemia    GERD (gastroesophageal reflux disease)     Past Surgical History:  Procedure Laterality Date   BREAST SURGERY     mastectomy, left breast    COLONOSCOPY WITH PROPOFOL N/A 02/10/2022   Procedure: COLONOSCOPY WITH PROPOFOL;  Surgeon: Toledo, Boykin Nearing, MD;  Location: [facility] ENDOSCOPY;  Service: Gastroenterology;   Laterality: N/A;   MASTECTOMY Right 2000   BREAST CA   ROBOTIC ASSISTED TOTAL HYSTERECTOMY  06/16/2021    OB History   No obstetric history on file.      Home Medications    Prior to Admission medications   Medication Sig Start Date End Date Taking? Authorizing Provider  amoxicillin-clavulanate (AUGMENTIN) 875-125 MG tablet Take 1 tablet by mouth 2 (two) times daily. 12/07/22   Kara Dies, NP  aspirin 81 MG tablet Take 81 mg by mouth daily.    [provider]  chlorpheniramine-HYDROcodone (TUSSIONEX) 10-8 MG/5ML Take 5 mLs by mouth at bedtime as needed for cough. 12/07/22   Kara Dies, NP  Cholecalciferol 25 MCG (1000 UT) tablet Take by mouth.    [provider]  Docusate Sodium (DSS) 100 MG CAPS Take 2 capsules by mouth at bedtime.    [provider]  Multiple Vitamin (MULTIVITAMIN) tablet Take 1 tablet by mouth daily.    [provider]  predniSONE (DELTASONE) 20 MG tablet Take 1 tablet (20 mg total) by mouth daily with breakfast. 12/07/22   Kara Dies, NP  simvastatin (ZOCOR) 40 MG tablet Take 1 tablet (40 mg total) by mouth every evening. 06/23/22   Sherlene Shams, MD  Wheat Dextrin (BENEFIBER DRINK MIX PO) Take 1 Package by mouth 2 (two) times daily.    [provider]  Family History Family History  Problem Relation Age of Onset   Hypertension Mother    Heart disease Father    Cancer Maternal Grandmother    Breast cancer Neg Hx     Social History Social History   Tobacco Use   Smoking status: Never   Smokeless tobacco: Never  Vaping Use   Vaping status: Never Used  Substance Use Topics   Alcohol use: Yes    Comment: OCC   Drug use: No     Allergies   Patient has no known allergies.   Review of Systems Review of Systems  Constitutional:  Negative for chills and fever.  HENT:  Negative for ear pain and sore throat.   Eyes:  Negative for pain and visual disturbance.  Respiratory:  Negative for  cough and shortness of breath.   Cardiovascular:  Negative for chest pain and palpitations.  Gastrointestinal:  Negative for abdominal pain and vomiting.  Genitourinary:  Negative for dysuria and hematuria.  Musculoskeletal:  Negative for arthralgias and back pain.  Skin:  Positive for color change and rash.  Neurological:  Negative for seizures and syncope.  All other systems reviewed and are negative.    Physical Exam Triage Vital Signs ED Triage Vitals  Encounter Vitals Group     BP 12/18/22 0900 115/74     Systolic BP Percentile --      Diastolic BP Percentile --      Pulse Rate 12/18/22 0900 76     Resp 12/18/22 0900 14     Temp 12/18/22 0900 97.6 F (36.4 C)     Temp Source 12/18/22 0900 Oral     SpO2 12/18/22 0900 96 %     Weight 12/18/22 0858 164 lb 10.9 oz (74.7 kg)     Height 12/18/22 0858 5\' 7"  (1.702 m)     Head Circumference --      Peak Flow --      Pain Score 12/18/22 0858 6     Pain Loc --      Pain Education --      Exclude from Growth Chart --    No data found.  Updated Vital Signs BP 115/74 (BP Location: Right Arm)   Pulse 76   Temp 97.6 F (36.4 C) (Oral)   Resp 14   Ht 5\' 7"  (1.702 m)   Wt 164 lb 10.9 oz (74.7 kg)   SpO2 96%   BMI 25.79 kg/m   Visual Acuity Right Eye Distance:   Left Eye Distance:   Bilateral Distance:    Right Eye Near:   Left Eye Near:    Bilateral Near:     Physical Exam Vitals and nursing note reviewed.  Constitutional:      General: She is not in acute distress.    Appearance: She is well-developed.  HENT:     Head: Normocephalic and atraumatic.  Eyes:     Conjunctiva/sclera: Conjunctivae normal.  Cardiovascular:     Rate and Rhythm: Normal rate and regular rhythm.     Heart sounds: No murmur heard. Pulmonary:     Effort: Pulmonary effort is normal. No respiratory distress.     Breath sounds: Normal breath sounds.  Abdominal:     Palpations: Abdomen is soft.     Tenderness: There is no abdominal  tenderness.  Musculoskeletal:        General: Swelling present.     Cervical back: Neck supple.  Skin:    General: Skin is warm and  dry.     Capillary Refill: Capillary refill takes less than 2 seconds.     Findings: Erythema present.       Neurological:     Mental Status: She is alert.  Psychiatric:        Mood and Affect: Mood normal.      UC Treatments / Results  Labs (all labs ordered are listed, but only abnormal results are displayed) Labs Reviewed - No data to display  EKG   Radiology No results found.  Procedures Procedures (including critical care time)  Medications Ordered in UC Medications - No data to display  Initial Impression / Assessment and Plan / UC Course  I have reviewed the triage vital signs and the nursing notes.  Pertinent labs & imaging results that were available during my care of the patient were reviewed by me and considered in my medical decision making (see chart for details).     Allergic reaction to unknown insect bite.  Will give 80 mg Depo-Medrol injection here and start on a steroid taper.  Patient is advised to seek immediate emergency assistance should she develop any facial swelling or shortness of breath.Follow up with urgent care if needed Final Clinical Impressions(s) / UC Diagnoses   Final diagnoses:  None   Discharge Instructions   None    ED Prescriptions   None    PDMP not reviewed this encounter.   Quintella Reichert 12/18/22 0949    Landis Martins, PA-C 12/18/22 [phone]

## 2023-01-10 ENCOUNTER — Encounter: Payer: Self-pay | Admitting: Internal Medicine

## 2023-01-10 ENCOUNTER — Other Ambulatory Visit: Payer: Self-pay | Admitting: Nurse Practitioner

## 2023-01-10 NOTE — Telephone Encounter (Signed)
Patient just called again and asked could she be prescribed with some cough syrup. She has a bad cough. The pharmacy she uses [facility] - [place], [place] - [address], [place] [address] Phone: [phone]  Fax: [phone]  Her number is [phone]

## 2023-01-10 NOTE — Telephone Encounter (Signed)
LMTCB. Pt will need to schedule an appt to be evaluated or be seen at an urgent care for evaluation.

## 2023-01-11 NOTE — Telephone Encounter (Signed)
noted 

## 2023-01-11 NOTE — Telephone Encounter (Signed)
Patient returned our call.  I read message from Sandy Salaam, CMA, to patient.  Patient states she already has an appointment.

## 2023-01-13 ENCOUNTER — Encounter: Payer: Self-pay | Admitting: Nurse Practitioner

## 2023-01-13 ENCOUNTER — Ambulatory Visit (INDEPENDENT_AMBULATORY_CARE_PROVIDER_SITE_OTHER): Payer: Medicare Other | Admitting: Nurse Practitioner

## 2023-01-13 VITALS — BP 108/68 | HR 54 | Temp 98.0°F | Ht 67.0 in | Wt 165.2 lb

## 2023-01-13 DIAGNOSIS — U071 COVID-19: Secondary | ICD-10-CM

## 2023-01-13 DIAGNOSIS — J029 Acute pharyngitis, unspecified: Secondary | ICD-10-CM | POA: Diagnosis not present

## 2023-01-13 DIAGNOSIS — R059 Cough, unspecified: Secondary | ICD-10-CM | POA: Diagnosis not present

## 2023-01-13 LAB — POC COVID19 BINAXNOW: SARS Coronavirus 2 Ag: POSITIVE — AB

## 2023-01-13 MED ORDER — HYDROCOD POLI-CHLORPHE POLI ER 10-8 MG/5ML PO SUER: 5.0000 mL | Freq: Every evening | ORAL | 0 refills | Status: DC | PRN

## 2023-01-13 NOTE — Progress Notes (Signed)
Established Patient Office Visit  Subjective:  Patient ID: Lori Caldwell, female    DOB: 06-29-1940  Age: 82 y.o. MRN: [id number]  CC:  Chief Complaint  Patient presents with   Cough    Cough, sore throat, headache, congestion and raspy voice since Sunday    HPI  Lori Caldwell presents for cough, sore throat, headache, congestion and hoarseness of voice from last 4 to 5 days. Patient states that she has been taking over-the-counter medication without significant improvement. She denies any fever, chest tightness. Medication: Tylenol, Zyrtec and Benadryl  HPI   Past Medical History:  Diagnosis Date   Breast cancer (HCC) 2000   RT MASTECTOMY   GERD (gastroesophageal reflux disease)    Hyperlipidemia    Personal history of chemotherapy 2000   BREAST CA   Personal history of radiation therapy 2000   BREAST CA    Past Surgical History:  Procedure Laterality Date   BREAST SURGERY     mastectomy, left breast    COLONOSCOPY WITH PROPOFOL N/A 02/10/2022   Procedure: COLONOSCOPY WITH PROPOFOL;  Surgeon: Toledo, Boykin Nearing, MD;  Location: [facility];  Service: Gastroenterology;  Laterality: N/A;   MASTECTOMY Right 2000   BREAST CA   ROBOTIC ASSISTED TOTAL HYSTERECTOMY  06/16/2021    Family History  Problem Relation Age of Onset   Hypertension Mother    Heart disease Father    Cancer Maternal Grandmother    Breast cancer Neg Hx     Social History   Socioeconomic History   Marital status: Married    Spouse name: Annette Stable   Number of children: Not on file   Years of education: Not on file   Highest education level: Bachelor's degree (e.g., BA, AB, BS)  Occupational History   Not on file  Tobacco Use   Smoking status: Never   Smokeless tobacco: Never  Vaping Use   Vaping status: Never Used  Substance and Sexual Activity   Alcohol use: Yes    Comment: OCC   Drug use: No   Sexual activity: Not on file  Other Topics Concern   Not on file  Social History  Narrative   Married    Lawyer    Social Determinants of Health   Financial Resource Strain: Low Risk  (03/29/2022)   Overall Financial Resource Strain (CARDIA)    Difficulty of Paying Living Expenses: Not hard at all  Food Insecurity: No Food Insecurity (03/29/2022)   Hunger Vital Sign    Worried About Running Out of Food in the Last Year: Never true    Ran Out of Food in the Last Year: Never true  Transportation Needs: No Transportation Needs (03/29/2022)   PRAPARE - Administrator, Civil Service (Medical): No    Lack of Transportation (Non-Medical): No  Physical Activity: Sufficiently Active (03/29/2022)   Exercise Vital Sign    Days of Exercise per Week: 5 days    Minutes of Exercise per Session: 30 min  Stress: No Stress Concern Present (03/29/2022)   [employer] of Occupational Health - Occupational Stress Questionnaire    Feeling of Stress : Not at all  Social Connections: Socially Integrated (03/29/2022)   Social Connection and Isolation Panel [NHANES]    Frequency of Communication with Friends and Family: More than three times a week    Frequency of Social Gatherings with Friends and Family: More than three times a week    Attends Religious Services: 1 to 4 times  per year    Active Member of Clubs or Organizations: Yes    Attends Banker Meetings: 1 to 4 times per year    Marital Status: Married  Catering manager Violence: Not At Risk (03/29/2022)   Humiliation, Afraid, Rape, and Kick questionnaire    Fear of Current or Ex-Partner: No    Emotionally Abused: No    Physically Abused: No    Sexually Abused: No     Outpatient Medications Prior to Visit  Medication Sig Dispense Refill   amoxicillin-clavulanate (AUGMENTIN) 875-125 MG tablet Take 1 tablet by mouth 2 (two) times daily. 20 tablet 0   aspirin 81 MG tablet Take 81 mg by mouth daily.     Cholecalciferol 25 MCG (1000 UT) tablet Take by mouth.     Docusate Sodium (DSS)  100 MG CAPS Take 2 capsules by mouth at bedtime.     methylPREDNISolone (MEDROL DOSEPAK) 4 MG TBPK tablet Dose pack, take as directed on package. 21 tablet 0   Multiple Vitamin (MULTIVITAMIN) tablet Take 1 tablet by mouth daily.     predniSONE (DELTASONE) 20 MG tablet Take 1 tablet (20 mg total) by mouth daily with breakfast. 5 tablet 0   simvastatin (ZOCOR) 40 MG tablet Take 1 tablet (40 mg total) by mouth every evening. 90 tablet 3   Wheat Dextrin (BENEFIBER DRINK MIX PO) Take 1 Package by mouth 2 (two) times daily.     chlorpheniramine-HYDROcodone (TUSSIONEX) 10-8 MG/5ML Take 5 mLs by mouth at bedtime as needed for cough. 70 mL 0   No facility-administered medications prior to visit.    No Known Allergies  ROS Review of Systems Negative unless indicated in HPI.    Objective:    Physical Exam Constitutional:      Appearance: Normal appearance.  HENT:     Mouth/Throat:     Pharynx: Posterior oropharyngeal erythema present.  Cardiovascular:     Rate and Rhythm: Normal rate and regular rhythm.     Pulses: Normal pulses.     Heart sounds: Normal heart sounds.  Pulmonary:     Effort: Pulmonary effort is normal.     Breath sounds: Normal breath sounds.  Musculoskeletal:     Cervical back: Normal range of motion.  Neurological:     General: No focal deficit present.     Mental Status: She is alert. Mental status is at baseline.  Psychiatric:        Mood and Affect: Mood normal.        Behavior: Behavior normal.        Thought Content: Thought content normal.        Judgment: Judgment normal.     BP 108/68   Pulse (!) 54   Temp 98 F (36.7 C)   Ht 5\' 7"  (1.702 m)   Wt 165 lb 3.2 oz (74.9 kg)   SpO2 96%   BMI 25.87 kg/m  Wt Readings from Last 3 Encounters:  01/13/23 165 lb 3.2 oz (74.9 kg)  12/18/22 164 lb 10.9 oz (74.7 kg)  12/07/22 164 lb 9.6 oz (74.7 kg)     Health Maintenance  Topic Date Due   COVID-19 Vaccine (6 - 2023-24 season) 01/29/2023 (Originally  01/02/2023)   INFLUENZA VACCINE  08/01/2023 (Originally 12/02/2022)   Medicare Annual Wellness (AWV)  03/30/2023   DTaP/Tdap/Td (3 - Td or Tdap) 04/06/2032   Pneumonia Vaccine 48+ Years old  Completed   DEXA SCAN  Completed   Zoster Vaccines- Shingrix  Completed  HPV VACCINES  Aged Out   Hepatitis C Screening  Discontinued    There are no preventive care reminders to display for this patient.  Lab Results  Component Value Date   TSH 3.70 06/23/2022   Lab Results  Component Value Date   WBC 5.2 06/23/2022   HGB 15.1 (H) 06/23/2022   HCT 45.3 06/23/2022   MCV 99.8 06/23/2022   PLT 258.0 06/23/2022   Lab Results  Component Value Date   NA 142 06/23/2022   K 4.6 06/23/2022   CO2 24 06/23/2022   GLUCOSE 89 06/23/2022   BUN 15 06/23/2022   CREATININE 0.87 06/23/2022   BILITOT 0.7 06/23/2022   ALKPHOS 62 06/23/2022   AST 25 06/23/2022   ALT 20 06/23/2022   PROT 7.7 06/23/2022   ALBUMIN 4.8 06/23/2022   CALCIUM 10.4 06/23/2022   GFR 62.81 06/23/2022   Lab Results  Component Value Date   CHOL 212 (H) 06/23/2022   Lab Results  Component Value Date   HDL 82 06/23/2022   Lab Results  Component Value Date   LDLCALC 103 (H) 06/23/2022   Lab Results  Component Value Date   TRIG 159 (H) 06/23/2022   Lab Results  Component Value Date   CHOLHDL 2.6 06/23/2022   No results found for: "HGBA1C"    Assessment & Plan:  COVID-19 Assessment & Plan: POCT positive for COVID. Symptomatic treatment discussed. Increase fluid intake and rest. Take over-the-counter Mucinex for congestion. Refilled Tussionex for cough.   Cough, unspecified type -     POC COVID-19 BinaxNow  Sore throat -     POC COVID-19 BinaxNow  Other orders -     Hydrocod Poli-Chlorphe Poli ER; Take 5 mLs by mouth at bedtime as needed for cough.  Dispense: 70 mL; Refill: 0    Follow-up: Return if symptoms worsen or fail to improve.   Kara Dies, NP

## 2023-01-13 NOTE — Patient Instructions (Signed)
Rx sent to pharmacy. Symptomatic treatment discussed. Take OTC plain Mucinex   Isolation Instructions: You are to isolate at home for 5 days from onset of your symptoms. If you must be around other household members who do not have symptoms, you need to make sure that both you and the family members are masking consistently with a high-quality mask.   After day 5 of isolation, if you have had no fever within 24 hours and you are feeling better, you can end isolation but need to mask for an additional 5 days.   After day 5 if you have a fever or are having significant symptoms, please isolate for full 10 days.   If you note any worsening of symptoms despite treatment, please seek an in-person evaluation ASAP. If you note any significant shortness of breath or any chest pain, please seek ER evaluation.

## 2023-01-23 DIAGNOSIS — U071 COVID-19: Secondary | ICD-10-CM | POA: Insufficient documentation

## 2023-01-23 NOTE — Assessment & Plan Note (Addendum)
POCT positive for COVID. Symptomatic treatment discussed. Increase fluid intake and rest. Take over-the-counter Mucinex for congestion. Refilled Tussionex for cough.

## 2023-01-25 DIAGNOSIS — H02831 Dermatochalasis of right upper eyelid: Secondary | ICD-10-CM | POA: Diagnosis not present

## 2023-01-25 DIAGNOSIS — H02423 Myogenic ptosis of bilateral eyelids: Secondary | ICD-10-CM | POA: Diagnosis not present

## 2023-01-25 DIAGNOSIS — H02834 Dermatochalasis of left upper eyelid: Secondary | ICD-10-CM | POA: Diagnosis not present

## 2023-02-04 IMAGING — MG DIGITAL SCREENING UNILAT LEFT W/ TOMO W/ CAD
4 series · 4 of 12 positions shown · non-contrast
Comparison: Previous exam(s).

CLINICAL DATA: Screening.

EXAM:
DIGITAL SCREENING UNILATERAL LEFT MAMMOGRAM WITH CAD AND
TOMOSYNTHESIS
TECHNIQUE: Left screening digital craniocaudal and mediolateral oblique
mammograms were obtained. Left screening digital breast
tomosynthesis was performed. The images were evaluated with
computer-aided detection.

[L MLO synth-2D]
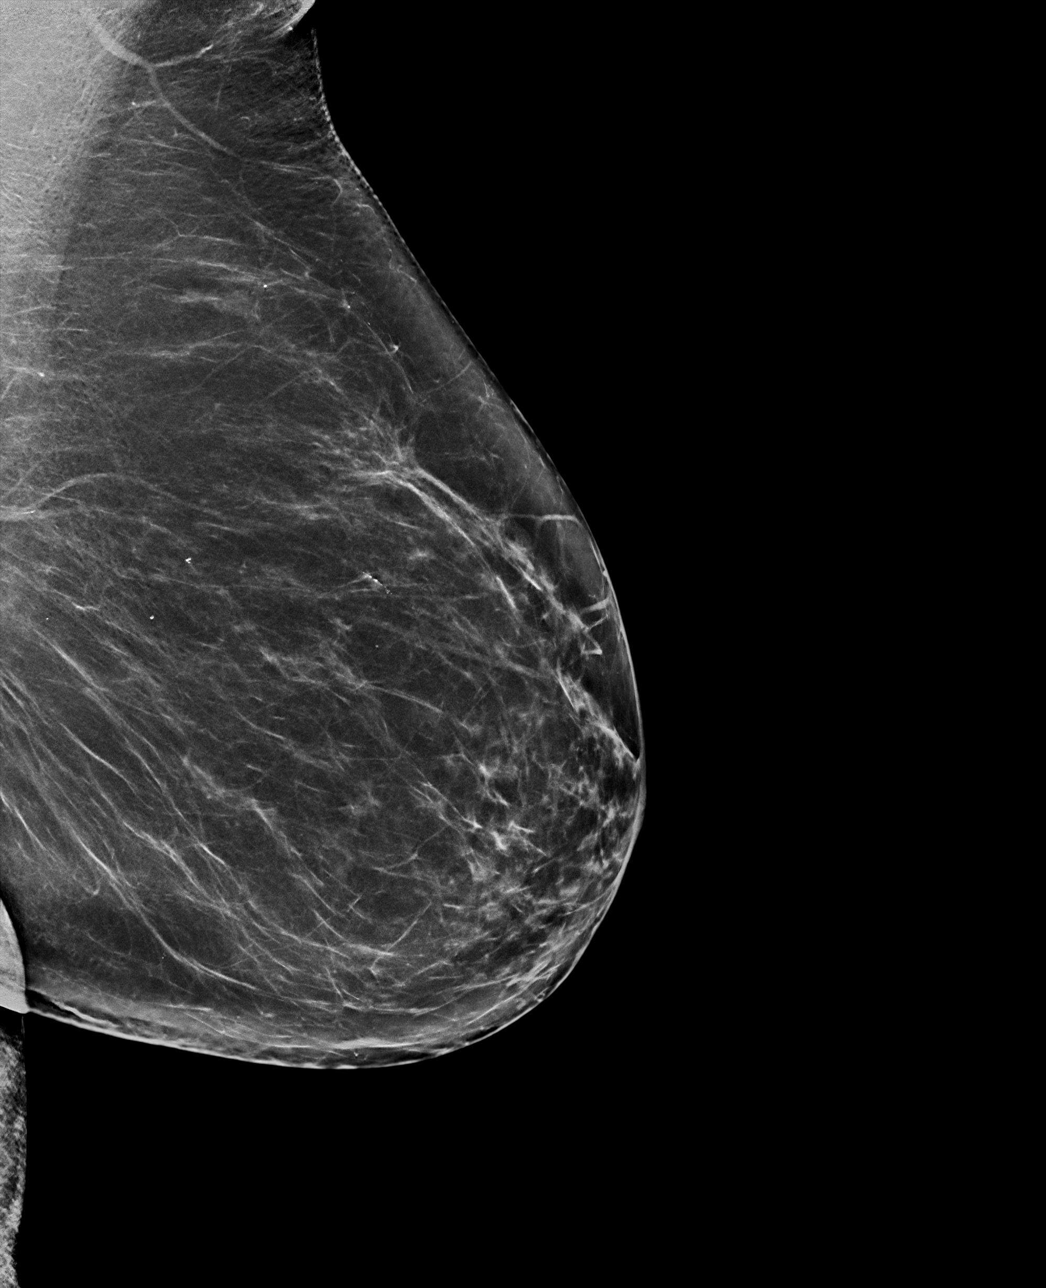

[L CC synth-2D]
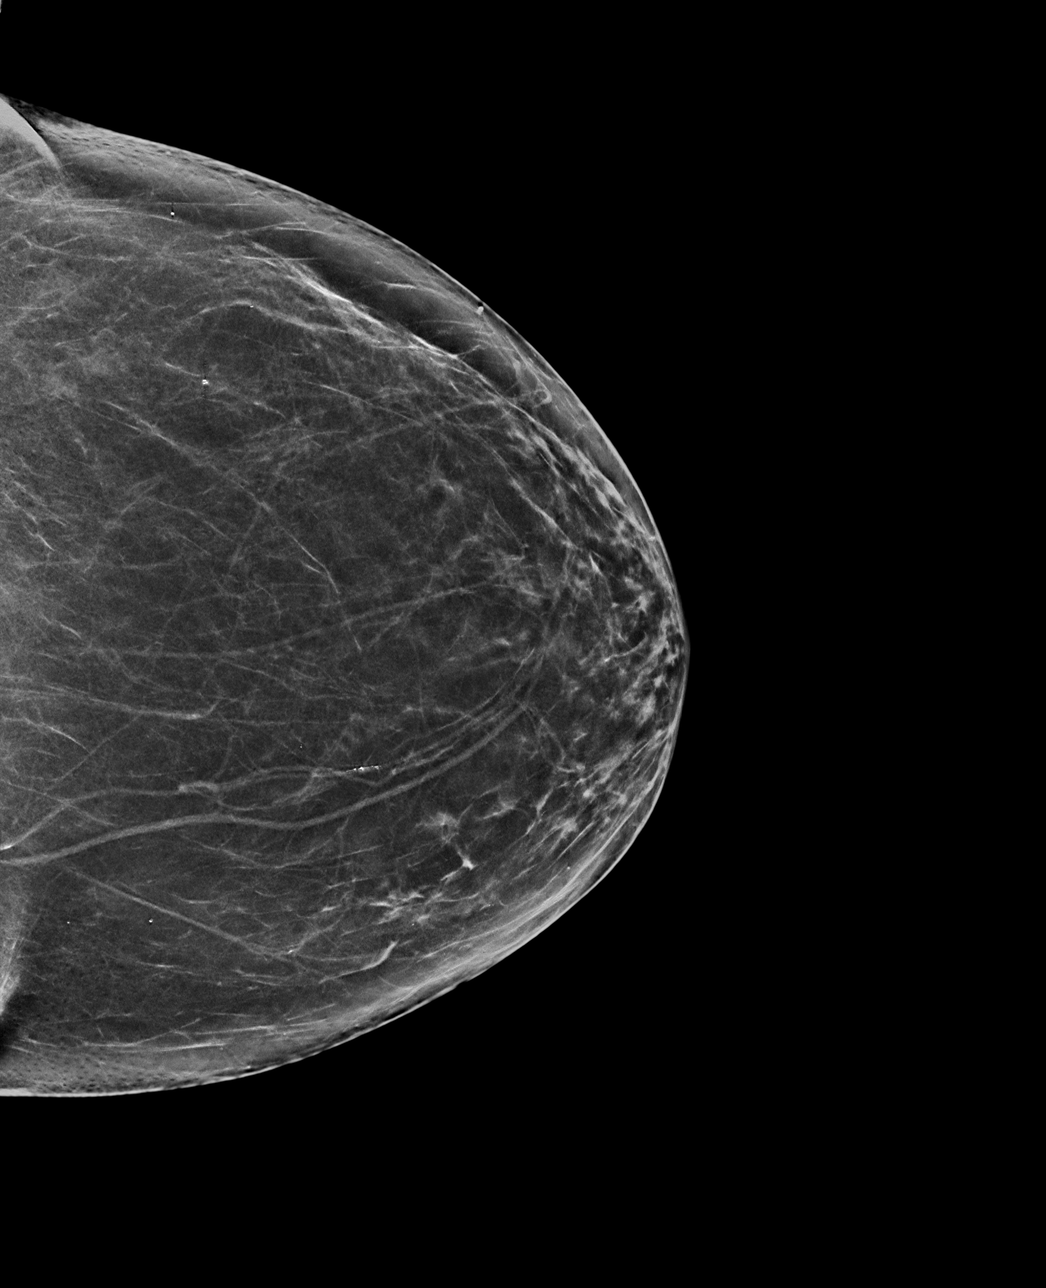

[L CC tomo · tomo slice 35/70.0]
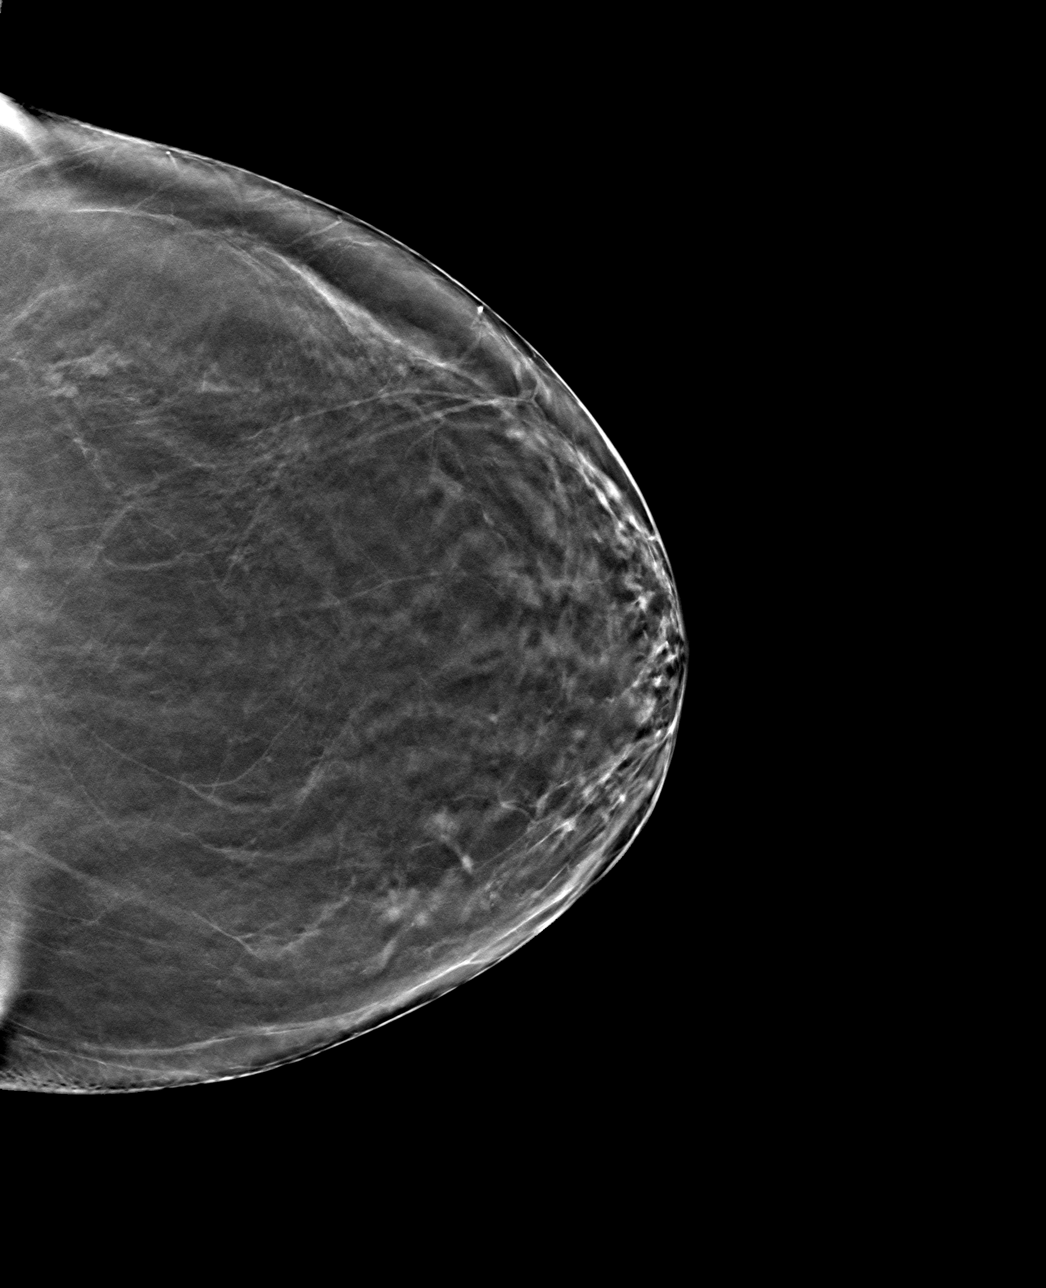

[L MLO tomo · tomo slice 39/76.0]
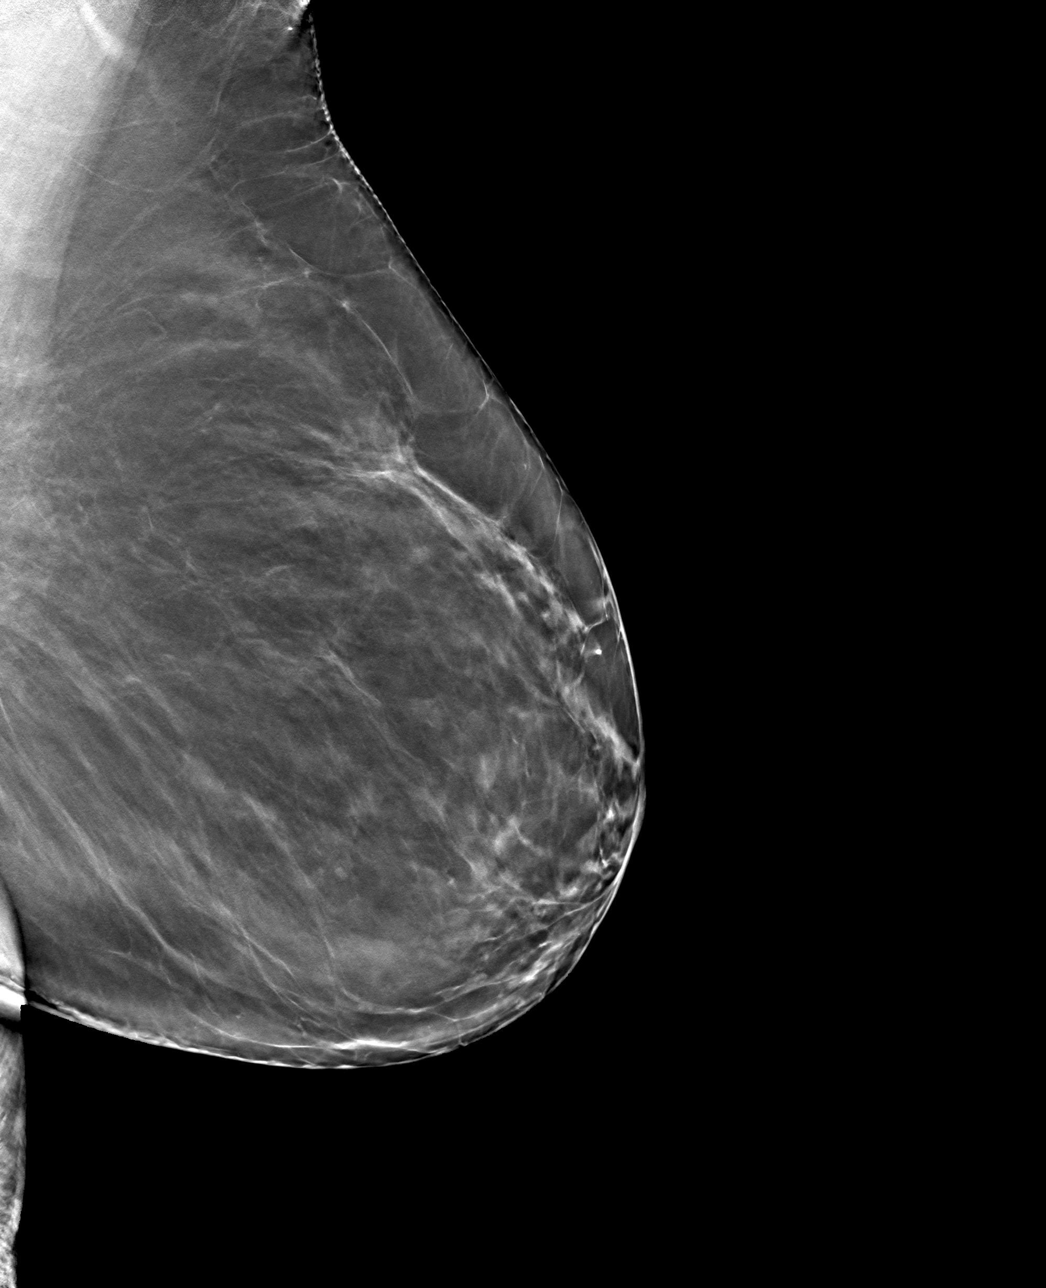

[4 of 12 positions shown; findings below may reference images not displayed]

ACR Breast Density Category b: There are scattered areas of
fibroglandular density.
FINDINGS: The patient has had a right mastectomy. There are no findings
suspicious for malignancy.
IMPRESSION: No mammographic evidence of malignancy. A result letter of this
screening mammogram will be mailed directly to the patient.

RECOMMENDATION:
Screening mammogram in one year.  (Code:[id number])

BI-RADS CATEGORY  1: Negative.

## 2023-02-25 DIAGNOSIS — L570 Actinic keratosis: Secondary | ICD-10-CM | POA: Diagnosis not present

## 2023-02-25 DIAGNOSIS — B078 Other viral warts: Secondary | ICD-10-CM | POA: Diagnosis not present

## 2023-02-25 DIAGNOSIS — Z85828 Personal history of other malignant neoplasm of skin: Secondary | ICD-10-CM | POA: Diagnosis not present

## 2023-02-25 DIAGNOSIS — D225 Melanocytic nevi of trunk: Secondary | ICD-10-CM | POA: Diagnosis not present

## 2023-02-25 DIAGNOSIS — L720 Epidermal cyst: Secondary | ICD-10-CM | POA: Diagnosis not present

## 2023-02-25 DIAGNOSIS — L812 Freckles: Secondary | ICD-10-CM | POA: Diagnosis not present

## 2023-02-25 DIAGNOSIS — B079 Viral wart, unspecified: Secondary | ICD-10-CM | POA: Diagnosis not present

## 2023-02-25 DIAGNOSIS — L821 Other seborrheic keratosis: Secondary | ICD-10-CM | POA: Diagnosis not present

## 2023-02-25 DIAGNOSIS — Z1283 Encounter for screening for malignant neoplasm of skin: Secondary | ICD-10-CM | POA: Diagnosis not present

## 2023-03-14 DIAGNOSIS — H6123 Impacted cerumen, bilateral: Secondary | ICD-10-CM | POA: Diagnosis not present

## 2023-03-14 DIAGNOSIS — H903 Sensorineural hearing loss, bilateral: Secondary | ICD-10-CM | POA: Diagnosis not present

## 2023-03-22 ENCOUNTER — Telehealth: Payer: Self-pay | Admitting: Internal Medicine

## 2023-03-22 NOTE — Telephone Encounter (Signed)
Copied from CRM [id number]. Topic: Medicare AWV >> Mar 22, 2023 10:05 AM Payton Doughty wrote: Reason for CRM: Called LVM 03/22/2023 to schedule Annual Wellness Visit  Verlee Rossetti; Care Guide Ambulatory Clinical Support [place] [facility] Direct Dial: [phone]

## 2023-04-07 ENCOUNTER — Encounter: Payer: Self-pay | Admitting: Family Medicine

## 2023-04-07 ENCOUNTER — Ambulatory Visit (INDEPENDENT_AMBULATORY_CARE_PROVIDER_SITE_OTHER): Payer: Medicare Other | Admitting: Family Medicine

## 2023-04-07 ENCOUNTER — Ambulatory Visit: Payer: Medicare Other | Admitting: Nurse Practitioner

## 2023-04-07 VITALS — BP 116/76 | HR 76 | Temp 98.4°F | Resp 18 | Ht 67.0 in | Wt 165.4 lb

## 2023-04-07 DIAGNOSIS — L03116 Cellulitis of left lower limb: Secondary | ICD-10-CM

## 2023-04-07 NOTE — Patient Instructions (Addendum)
It was a pleasure meeting you today. Thank you for allowing me to take part in your health care.  Our goals for today as we discussed include:  Do not think you need antibiotics today. Will mark and if tomorrow the redness extends beyond the lines please notify me and will send in prescription to have on hand for over weekend, and if increasing pain, fevers or spreading of redness can start.  Cover wound with non stick dressing while wearing clothing that can rub the area.  When at home can open to air.  No indication to use neosporin or Vaseline  This is a list of the screening recommended for you and due dates:  Health Maintenance  Topic Date Due   COVID-19 Vaccine (6 - 2023-24 season) 01/02/2023   Medicare Annual Wellness Visit  03/30/2023   DTaP/Tdap/Td vaccine (3 - Td or Tdap) 04/06/2032   Pneumonia Vaccine  Completed   Flu Shot  Completed   DEXA scan (bone density measurement)  Completed   Zoster (Shingles) Vaccine  Completed   HPV Vaccine  Aged Out   Hepatitis C Screening  Discontinued    Follow up with PCP as needed  If you have any questions or concerns, please do not hesitate to call the office at [phone].  I look forward to our next visit and until then take care and stay safe.  Regards,   Dana Allan, MD   [facility]

## 2023-04-07 NOTE — Progress Notes (Signed)
SUBJECTIVE:   Chief Complaint  Patient presents with   Puncture Wound    On left leg old wound 2-3 weeks. Wound looks like it heal then get worse.    HPI Presents for acute visit  Discussed the use of AI scribe software for clinical note transcription with the patient, who gave verbal consent to proceed.  History of Present Illness The patient presents with a concern about a leg wound that occurred several weeks ago. The wound was initially healing well with the application of Vaseline, as advised by a dermatologist. However, the patient noticed a change in the wound's appearance, with increased redness and oozing of a yellow substance. The patient denies pain but reports sensitivity to touch and discomfort with hot water. The wound was initially caused by an outdoor accident involving a puncture by a stick or similar object. The patient has been active, including walking and other outdoor activities, and noticed that certain clothing, such as jeans, seems to irritate the wound. The patient has a history of thin skin and previous wounds, including one that became infected due to the use of Band-Aids. The patient is on a baby aspirin regimen but denies having diabetes. The patient is up-to-date on vaccines, including tetanus, shingles, and flu.    PERTINENT PMH / PSH: As above  OBJECTIVE:  BP 116/76   Pulse 76   Temp 98.4 F (36.9 C)   Resp 18   Ht 5\' 7"  (1.702 m)   Wt 165 lb 6 oz (75 kg)   SpO2 98%   BMI 25.90 kg/m    Physical Exam Constitutional:      General: She is not in acute distress.    Appearance: She is normal weight. She is not ill-appearing or toxic-appearing.  Neurological:     Mental Status: She is alert.         04/07/2023    1:35 PM 06/23/2022    9:35 AM 03/29/2022    1:12 PM 03/29/2022    1:09 PM 03/22/2022    3:21 PM  Depression screen PHQ 2/9  Decreased Interest 0 0 0 0 0  Down, Depressed, Hopeless 0 0 0 0 0  PHQ - 2 Score 0 0 0 0 0  Altered  sleeping 0      Tired, decreased energy 0      Change in appetite 0      Feeling bad or failure about yourself  0      Trouble concentrating 0      Moving slowly or fidgety/restless 0      Suicidal thoughts 0      PHQ-9 Score 0      Difficult doing work/chores Not difficult at all          04/07/2023    1:36 PM  GAD 7 : Generalized Anxiety Score  Nervous, Anxious, on Edge 0  Control/stop worrying 0  Worry too much - different things 0  Trouble relaxing 0  Restless 0  Easily annoyed or irritable 0  Afraid - awful might happen 0  Total GAD 7 Score 0  Anxiety Difficulty Not difficult at all    ASSESSMENT/PLAN:  Cellulitis of left lower limb Assessment & Plan: Punctured wound on the leg several weeks ago, initially healing but now with increased redness and oozing. No pain or systemic symptoms. Patient has thin skin and reports friction from clothing. No diabetes or other significant comorbidities. -Apply nonstick dressings, especially when wearing pants that may rub the  wound. -Avoid applying Neosporin or Vaseline. -Observe for any increase in redness beyond the marked area and message doctor if this occurs. -Tetanus vaccine up to date    PDMP reviewed  Return if symptoms worsen or fail to improve, for PCP.  Dana Allan, MD

## 2023-04-10 ENCOUNTER — Encounter: Payer: Self-pay | Admitting: Family Medicine

## 2023-04-10 NOTE — Assessment & Plan Note (Addendum)
Punctured wound on the leg several weeks ago, initially healing but now with increased redness and oozing. No pain or systemic symptoms. Patient has thin skin and reports friction from clothing. No diabetes or other significant comorbidities. -Apply nonstick dressings, especially when wearing pants that may rub the wound. -Avoid applying Neosporin or Vaseline. -Observe for any increase in redness beyond the marked area and message doctor if this occurs. -Tetanus vaccine up to date

## 2023-04-19 ENCOUNTER — Telehealth: Payer: Self-pay | Admitting: Internal Medicine

## 2023-04-19 NOTE — Telephone Encounter (Signed)
Copied from CRM [id number]. Topic: Medicare AWV >> Apr 19, 2023  3:30 PM Payton Doughty wrote: Reason for CRM: LVM 04/19/23 to r/s AWV appt. New AWV appt date 05/16/22 at 1:40pm. Please confirm date change [facility] Phone AWVS- [phone]*  Verlee Rossetti; Care Guide Ambulatory Clinical Support [place] [facility] Direct Dial: [phone]

## 2023-05-18 ENCOUNTER — Ambulatory Visit: Payer: Medicare Other | Admitting: *Deleted

## 2023-05-18 VITALS — Ht 67.0 in | Wt 160.0 lb

## 2023-05-18 DIAGNOSIS — Z0000 Encounter for general adult medical examination without abnormal findings: Secondary | ICD-10-CM

## 2023-05-18 NOTE — Progress Notes (Signed)
Subjective:   Lori Caldwell is a 83 y.o. female who presents for Medicare Annual (Subsequent) preventive examination.  Visit Complete: Virtual I connected with  Lori Caldwell on 05/18/23 by a audio enabled telemedicine application and verified that I am speaking with the correct person using two identifiers. This patient declined Interactive audio and Acupuncturist. Therefore the visit was completed with audio only.   Patient Location: Home  Provider Location: Office/Clinic  I discussed the limitations of evaluation and management by telemedicine. The patient expressed understanding and agreed to proceed.  Vital Signs: Because this visit was a virtual/telehealth visit, some criteria may be missing or patient reported. Any vitals not documented were not able to be obtained and vitals that have been documented are patient reported.    Cardiac Risk Factors include: advanced age (>39men, >65 women);dyslipidemia     Objective:    Today's Vitals   05/18/23 1457  Weight: 160 lb (72.6 kg)  Height: 5\' 7"  (1.702 m)   Body mass index is 25.06 kg/m.     05/18/2023    3:10 PM 12/18/2022    8:59 AM 03/29/2022    1:08 PM 02/10/2022   10:04 AM 09/17/2020    3:55 PM 05/18/2019    3:08 PM 05/31/2017    4:04 PM  Advanced Directives  Does Patient Have a Medical Advance Directive? Yes No Yes Yes Yes Yes Yes  Type of Estate agent of [place];Living will   Healthcare Power of [place];Living will Healthcare Power of [place];Living will Healthcare Power of [place];Living will Healthcare Power of [place];Living will  Does patient want to make changes to medical advance directive?   No - Patient declined  No - Patient declined No - Patient declined No - Patient declined  Copy of Healthcare Power of Attorney in Chart? No - copy requested  Yes - validated most recent copy scanned in chart (See row information) No - copy requested Yes - validated most recent copy  scanned in chart (See row information) Yes - validated most recent copy scanned in chart (See row information) No - copy requested    Current Medications (verified) Outpatient Encounter Medications as of 05/18/2023  Medication Sig   aspirin 81 MG tablet Take 81 mg by mouth daily.   Cholecalciferol 25 MCG (1000 UT) tablet Take by mouth.   Docusate Sodium (DSS) 100 MG CAPS Take 2 capsules by mouth at bedtime.   Multiple Vitamin (MULTIVITAMIN) tablet Take 1 tablet by mouth daily.   simvastatin (ZOCOR) 40 MG tablet Take 1 tablet (40 mg total) by mouth every evening.   Wheat Dextrin (BENEFIBER DRINK MIX PO) Take 1 Package by mouth 2 (two) times daily.   No facility-administered encounter medications on file as of 05/18/2023.    Allergies (verified) Patient has no known allergies.   History: Past Medical History:  Diagnosis Date   Breast cancer (HCC) 2000   RT MASTECTOMY   GERD (gastroesophageal reflux disease)    Hyperlipidemia    Personal history of chemotherapy 2000   BREAST CA   Personal history of radiation therapy 2000   BREAST CA   Past Surgical History:  Procedure Laterality Date   BREAST SURGERY     mastectomy, left breast    COLONOSCOPY WITH PROPOFOL N/A 02/10/2022   Procedure: COLONOSCOPY WITH PROPOFOL;  Surgeon: Toledo, Boykin Nearing, MD;  Location: [facility] ENDOSCOPY;  Service: Gastroenterology;  Laterality: N/A;   MASTECTOMY Right 2000   BREAST CA   ROBOTIC ASSISTED TOTAL  HYSTERECTOMY  06/16/2021   Family History  Problem Relation Age of Onset   Hypertension Mother    Heart disease Father    Cancer Maternal Grandmother    Breast cancer Neg Hx    Social History   Socioeconomic History   Marital status: Married    Spouse name: Annette Stable   Number of children: Not on file   Years of education: Not on file   Highest education level: Bachelor's degree (e.g., BA, AB, BS)  Occupational History   Not on file  Tobacco Use   Smoking status: Never   Smokeless tobacco: Never   Vaping Use   Vaping status: Never Used  Substance and Sexual Activity   Alcohol use: Yes    Comment: OCC   Drug use: No   Sexual activity: Not on file  Other Topics Concern   Not on file  Social History Narrative   Married    Lawyer    Social Drivers of Health   Financial Resource Strain: Low Risk  (05/18/2023)   Overall Financial Resource Strain (CARDIA)    Difficulty of Paying Living Expenses: Not hard at all  Food Insecurity: No Food Insecurity (05/18/2023)   Hunger Vital Sign    Worried About Running Out of Food in the Last Year: Never true    Ran Out of Food in the Last Year: Never true  Transportation Needs: No Transportation Needs (05/18/2023)   PRAPARE - Administrator, Civil Service (Medical): No    Lack of Transportation (Non-Medical): No  Physical Activity: Sufficiently Active (05/18/2023)   Exercise Vital Sign    Days of Exercise per Week: 5 days    Minutes of Exercise per Session: 30 min  Stress: No Stress Concern Present (05/18/2023)   [employer] of Occupational Health - Occupational Stress Questionnaire    Feeling of Stress : Not at all  Social Connections: Socially Integrated (05/18/2023)   Social Connection and Isolation Panel [NHANES]    Frequency of Communication with Friends and Family: More than three times a week    Frequency of Social Gatherings with Friends and Family: More than three times a week    Attends Religious Services: More than 4 times per year    Active Member of [employer] or Organizations: Yes    Attends Engineer, structural: More than 4 times per year    Marital Status: Married    Tobacco Counseling Counseling given: Not Answered   Clinical Intake:  Pre-visit preparation completed: Yes  Pain : No/denies pain     BMI - recorded: 25.06 Nutritional Status: BMI 25 -29 Overweight Nutritional Risks: None Diabetes: No  How often do you need to have someone help you when you read instructions,  pamphlets, or other written materials from your doctor or pharmacy?: 1 - Never  Interpreter Needed?: No  Information entered by :: R. Mohanad Carsten LPN   Activities of Daily Living    05/18/2023    2:58 PM  In your present state of health, do you have any difficulty performing the following activities:  Hearing? 1  Comment wears aids  Vision? 0  Comment glasses  Difficulty concentrating or making decisions? 0  Walking or climbing stairs? 0  Dressing or bathing? 0  Doing errands, shopping? 0  Preparing Food and eating ? N  Using the Toilet? N  In the past six months, have you accidently leaked urine? N  Do you have problems with loss of bowel control? N  Managing  your Medications? N  Managing your Finances? N  Housekeeping or managing your Housekeeping? N    Patient Care Team: Sherlene Shams, MD as PCP - General (Internal Medicine)  Indicate any recent Medical Services you may have received from other than [facility] providers in the past year (date may be approximate).     Assessment:   This is a routine wellness examination for [facility].  Hearing/Vision screen Hearing Screening - Comments:: Wears aids Vision Screening - Comments:: glasses   Goals Addressed             This Visit's Progress    Patient Stated       Wants to continue to exercise and stay active       Depression Screen    05/18/2023    3:04 PM 04/07/2023    1:35 PM 06/23/2022    9:35 AM 03/29/2022    1:12 PM 03/29/2022    1:09 PM 03/22/2022    3:21 PM 01/27/2022    1:10 PM  PHQ 2/9 Scores  PHQ - 2 Score 0 0 0 0 0 0 0  PHQ- 9 Score 0 0         Fall Risk    05/18/2023    3:00 PM 04/07/2023    1:35 PM 06/23/2022    9:35 AM 03/29/2022    1:09 PM 03/22/2022    3:21 PM  Fall Risk   Falls in the past year? 0 0 0 0 0  Number falls in past yr: 0 0 0 0   Injury with Fall? 0 0 0 0   Risk for fall due to : No Fall Risks No Fall Risks No Fall Risks No Fall Risks No Fall Risks  Follow up Falls prevention  discussed;Falls evaluation completed Falls evaluation completed;Education provided Falls evaluation completed Falls evaluation completed;Falls prevention discussed Falls evaluation completed    MEDICARE RISK AT HOME: Medicare Risk at Home Any stairs in or around the home?: Yes If so, are there any without handrails?: No Home free of loose throw rugs in walkways, pet beds, electrical cords, etc?: No Adequate lighting in your home to reduce risk of falls?: Yes Life alert?: No Use of a cane, walker or w/c?: No Grab bars in the bathroom?: Yes Shower chair or bench in shower?: No Elevated toilet seat or a handicapped toilet?: No    Cognitive Function:    05/31/2017    4:43 PM  MMSE - Mini Mental State Exam  Orientation to time 5  Orientation to Place 5  Registration 3  Attention/ Calculation 5  Recall 3  Language- name 2 objects 2  Language- repeat 1  Language- follow 3 step command 3  Language- read & follow direction 1  Write a sentence 1  Copy design 1  Total score 30        05/18/2023    3:10 PM 03/29/2022    1:13 PM 05/18/2019    3:25 PM  6CIT Screen  What Year? 0 points  0 points  What month? 0 points  0 points  What time? 0 points  0 points  Count back from 20 0 points  0 points  Months in reverse 0 points 0 points 0 points  Repeat phrase 0 points  0 points  Total Score 0 points  0 points    Immunizations Immunization History  Administered Date(s) Administered   Influenza Split 02/19/2011   Influenza,inj,Quad PF,6+ Mos 01/15/2015   Influenza-Unspecified 03/03/2013, 04/02/2014, 02/17/2016, 02/07/2017, 03/09/2019, 03/03/2020,  02/16/2021, 03/12/2022, 03/18/2023   PFIZER Comirnaty(Gray Top)Covid-19 Tri-Sucrose Vaccine 02/06/2020   PFIZER(Purple Top)SARS-COV-2 Vaccination 05/09/2019, 05/30/2019, 02/06/2020   Pfizer Covid-19 Vaccine Bivalent Booster 67yrs & up 03/17/2021   Pneumococcal Conjugate-13 12/28/2013   Pneumococcal Polysaccharide-23 12/22/2007,  12/21/2012   Tdap 11/21/2011, 04/06/2022   Zoster Recombinant(Shingrix) 08/24/2017, 10/24/2017   Zoster, Live 11/20/2012    TDAP status: Up to date  Flu Vaccine status: Up to date  Pneumococcal vaccine status: Up to date  Covid-19 vaccine status: Information provided on how to obtain vaccines.   Qualifies for Shingles Vaccine? Yes   Zostavax completed Yes   Shingrix Completed?: Yes  Screening Tests Health Maintenance  Topic Date Due   COVID-19 Vaccine (6 - 2024-25 season) 01/02/2023   Medicare Annual Wellness (AWV)  03/30/2023   DTaP/Tdap/Td (3 - Td or Tdap) 04/06/2032   Pneumonia Vaccine 70+ Years old  Completed   INFLUENZA VACCINE  Completed   DEXA SCAN  Completed   Zoster Vaccines- Shingrix  Completed   HPV VACCINES  Aged Out   Hepatitis C Screening  Discontinued    Health Maintenance  Health Maintenance Due  Topic Date Due   COVID-19 Vaccine (6 - 2024-25 season) 01/02/2023   Medicare Annual Wellness (AWV)  03/30/2023    Colorectal cancer screening: No longer required.   Mammogram status: Completed 11/2022. Repeat every year  Bone Density status: Completed 02/2015. Results reflect: Bone density results: OSTEOPENIA. Repeat every 2 years. Patient wants to discuss with PCP  Lung Cancer Screening: (Low Dose CT Chest recommended if Age 63-80 years, 20 pack-year currently smoking OR have quit w/in 15years.) does not qualify.     Additional Screening:  Hepatitis C Screening: does qualify; Completed 07/2015  Vision Screening: Recommended annual ophthalmology exams for early detection of glaucoma and other disorders of the eye. Is the patient up to date with their annual eye exam?  No  Who is the provider or what is the name of the office in which the patient attends annual eye exams? Dr. Beverely Pace If pt is not established with a provider, would they like to be referred to a provider to establish care? No .   Dental Screening: Recommended annual dental exams for proper  oral hygiene    Community Resource Referral / Chronic Care Management: CRR required this visit?  No   CCM required this visit?  No     Plan:     I have personally reviewed and noted the following in the patient's chart:   Medical and social history Use of alcohol, tobacco or illicit drugs  Current medications and supplements including opioid prescriptions. Patient is not currently taking opioid prescriptions. Functional ability and status Nutritional status Physical activity Advanced directives List of other physicians Hospitalizations, surgeries, and ER visits in previous 12 months Vitals Screenings to include cognitive, depression, and falls Referrals and appointments  In addition, I have reviewed and discussed with patient certain preventive protocols, quality metrics, and best practice recommendations. A written personalized care plan for preventive services as well as general preventive health recommendations were provided to patient.//     Sydell Axon, LPN   1/61/0960   After Visit Summary: (MyChart) Due to this being a telephonic visit, the after visit summary with patients personalized plan was offered to patient via MyChart   Nurse Notes: None

## 2023-05-18 NOTE — Patient Instructions (Signed)
 Lori Caldwell , Thank you for taking time to come for your Medicare Wellness Visit. I appreciate your ongoing commitment to your health goals. Please review the following plan we discussed and let me know if I can assist you in the future.   Referrals/Orders/Follow-Ups/Clinician Recommendations: None  This is a list of the screening recommended for you and due dates:  Health Maintenance  Topic Date Due   COVID-19 Vaccine (6 - 2024-25 season) 01/02/2023   Mammogram  11/01/2023   Medicare Annual Wellness Visit  05/17/2024   DTaP/Tdap/Td vaccine (3 - Td or Tdap) 04/06/2032   Pneumonia Vaccine  Completed   Flu Shot  Completed   DEXA scan (bone density measurement)  Completed   Zoster (Shingles) Vaccine  Completed   HPV Vaccine  Aged Out   Hepatitis C Screening  Discontinued    Advanced directives: (Copy Requested) Please bring a copy of your health care power of attorney and living will to the office to be added to your chart at your convenience.  Next Medicare Annual Wellness Visit scheduled for next year: Yes 05/22/24 @ 3:00

## 2023-07-01 ENCOUNTER — Other Ambulatory Visit: Payer: Self-pay | Admitting: Internal Medicine

## 2023-07-07 ENCOUNTER — Ambulatory Visit

## 2023-07-08 ENCOUNTER — Encounter: Payer: Self-pay | Admitting: Nurse Practitioner

## 2023-07-08 ENCOUNTER — Ambulatory Visit (INDEPENDENT_AMBULATORY_CARE_PROVIDER_SITE_OTHER): Admitting: Nurse Practitioner

## 2023-07-08 VITALS — BP 118/72 | HR 76 | Temp 97.6°F | Ht 67.0 in | Wt 165.4 lb

## 2023-07-08 DIAGNOSIS — R6883 Chills (without fever): Secondary | ICD-10-CM

## 2023-07-08 DIAGNOSIS — R059 Cough, unspecified: Secondary | ICD-10-CM

## 2023-07-08 DIAGNOSIS — J069 Acute upper respiratory infection, unspecified: Secondary | ICD-10-CM

## 2023-07-08 LAB — POCT INFLUENZA A/B
Influenza A, POC: NEGATIVE
Influenza B, POC: NEGATIVE

## 2023-07-08 LAB — POC COVID19 BINAXNOW: SARS Coronavirus 2 Ag: NEGATIVE

## 2023-07-08 MED ORDER — GUAIFENESIN ER 600 MG PO TB12: 600.0000 mg | ORAL_TABLET | Freq: Two times a day (BID) | ORAL | 0 refills | Status: DC

## 2023-07-08 MED ORDER — AMOXICILLIN-POT CLAVULANATE 875-125 MG PO TABS: 1.0000 | ORAL_TABLET | Freq: Two times a day (BID) | ORAL | 0 refills | Status: DC

## 2023-07-08 MED ORDER — FLUTICASONE PROPIONATE 50 MCG/ACT NA SUSP: 2.0000 | Freq: Every day | NASAL | 6 refills | Status: DC

## 2023-07-08 MED ORDER — HYDROCOD POLI-CHLORPHE POLI ER 10-8 MG/5ML PO SUER
5.0000 mL | Freq: Every evening | ORAL | 0 refills | Status: DC | PRN
Start: 2023-07-08 — End: 2023-09-27

## 2023-07-08 NOTE — Patient Instructions (Addendum)
 Take plain Mucinex, Flonase nasal spray and cough syrup as bed time. Take probiotics while on antibiotic. Incresae fluid intake and rest.

## 2023-07-08 NOTE — Progress Notes (Signed)
 Established Patient Office Visit  Subjective:  Patient ID: Lori Caldwell, female    DOB: 1940/07/27  Age: 83 y.o. MRN: [id number]  CC:  Chief Complaint  Patient presents with   Cough    Allergies x 3 weeks with hoarse voice x 2 weeks and some aches Now Patient has cough x 3-4 days  Taking Delsym   Discussed the use of a AI scribe software for clinical note transcription with the patient, who gave verbal consent to proceed.  HPI  Lori Caldwell presents for acute visit for cough.  She initially attributed her symptoms to allergies and was taking Zyrtec in the morning and Benadryl at night from  3 weeks. She lost her voice but did not experience coughing initially. On Monday, she began coughing taking OTC cough syrup.  She describes her headache as more of a head pressure, accompanied by nasal congestion, postnasal drip, and a scratchy throat. She also notes body aches and chills but no fever. No ear pain is reported, but she mentions some ear pressure. No wheezing is present, and she can take deep breaths without difficulty.  In September, she had COVID-19 and recalls having a cold before that. She was concerned about possibly having COVID-19 again, as she was involved in a community event and did not want to be contagious.  HPI   Past Medical History:  Diagnosis Date   Breast cancer (HCC) 2000   RT MASTECTOMY   GERD (gastroesophageal reflux disease)    Hyperlipidemia    Personal history of chemotherapy 2000   BREAST CA   Personal history of radiation therapy 2000   BREAST CA    Past Surgical History:  Procedure Laterality Date   BREAST SURGERY     mastectomy, left breast    COLONOSCOPY WITH PROPOFOL N/A 02/10/2022   Procedure: COLONOSCOPY WITH PROPOFOL;  Surgeon: Toledo, Boykin Nearing, MD;  Location: [facility] ENDOSCOPY;  Service: Gastroenterology;  Laterality: N/A;   MASTECTOMY Right 2000   BREAST CA   ROBOTIC ASSISTED TOTAL HYSTERECTOMY  06/16/2021    Family History   Problem Relation Age of Onset   Hypertension Mother    Heart disease Father    Cancer Maternal Grandmother    Breast cancer Neg Hx     Social History   Socioeconomic History   Marital status: Married    Spouse name: Annette Stable   Number of children: Not on file   Years of education: Not on file   Highest education level: Bachelor's degree (e.g., BA, AB, BS)  Occupational History   Not on file  Tobacco Use   Smoking status: Never   Smokeless tobacco: Never  Vaping Use   Vaping status: Never Used  Substance and Sexual Activity   Alcohol use: Yes    Comment: OCC   Drug use: No   Sexual activity: Not on file  Other Topics Concern   Not on file  Social History Narrative   Married    Lawyer    Social Drivers of Health   Financial Resource Strain: Low Risk  (05/18/2023)   Overall Financial Resource Strain (CARDIA)    Difficulty of Paying Living Expenses: Not hard at all  Food Insecurity: No Food Insecurity (05/18/2023)   Hunger Vital Sign    Worried About Running Out of Food in the Last Year: Never true    Ran Out of Food in the Last Year: Never true  Transportation Needs: No Transportation Needs (05/18/2023)   PRAPARE - Transportation  Lack of Transportation (Medical): No    Lack of Transportation (Non-Medical): No  Physical Activity: Sufficiently Active (05/18/2023)   Exercise Vital Sign    Days of Exercise per Week: 5 days    Minutes of Exercise per Session: 30 min  Stress: No Stress Concern Present (05/18/2023)   [employer] of Occupational Health - Occupational Stress Questionnaire    Feeling of Stress : Not at all  Social Connections: Socially Integrated (05/18/2023)   Social Connection and Isolation Panel [NHANES]    Frequency of Communication with Friends and Family: More than three times a week    Frequency of Social Gatherings with Friends and Family: More than three times a week    Attends Religious Services: More than 4 times per year    Active  Member of [employer] or Organizations: Yes    Attends Engineer, structural: More than 4 times per year    Marital Status: Married  Catering manager Violence: Not At Risk (05/18/2023)   Humiliation, Afraid, Rape, and Kick questionnaire    Fear of Current or Ex-Partner: No    Emotionally Abused: No    Physically Abused: No    Sexually Abused: No     Outpatient Medications Prior to Visit  Medication Sig Dispense Refill   aspirin 81 MG tablet Take 81 mg by mouth daily.     Cholecalciferol 25 MCG (1000 UT) tablet Take by mouth.     Docusate Sodium (DSS) 100 MG CAPS Take 2 capsules by mouth at bedtime.     Multiple Vitamin (MULTIVITAMIN) tablet Take 1 tablet by mouth daily.     simvastatin (ZOCOR) 40 MG tablet Take 1 tablet (40 mg total) by mouth every evening. 90 tablet 1   Wheat Dextrin (BENEFIBER DRINK MIX PO) Take 1 Package by mouth 2 (two) times daily.     No facility-administered medications prior to visit.    No Known Allergies  ROS Review of Systems Negative unless indicated in HPI.    Objective:    Physical Exam HENT:     Right Ear: Tympanic membrane normal. Tympanic membrane is not erythematous.     Left Ear: Tympanic membrane normal. Tympanic membrane is not erythematous.     Nose:     Right Turbinates: Not enlarged.     Left Turbinates: Not enlarged.     Right Sinus: No maxillary sinus tenderness or frontal sinus tenderness.     Left Sinus: No maxillary sinus tenderness or frontal sinus tenderness.     Mouth/Throat:     Mouth: Mucous membranes are moist.     Pharynx: Postnasal drip present. No pharyngeal swelling, oropharyngeal exudate or posterior oropharyngeal erythema.     Tonsils: No tonsillar exudate.  Cardiovascular:     Rate and Rhythm: Normal rate and regular rhythm.  Pulmonary:     Effort: Pulmonary effort is normal.     Breath sounds: Normal breath sounds. No stridor. No wheezing.  Neurological:     General: No focal deficit present.     Mental  Status: She is oriented to person, place, and time. Mental status is at baseline.  Psychiatric:        Mood and Affect: Mood normal.        Behavior: Behavior normal.        Thought Content: Thought content normal.        Judgment: Judgment normal.     BP 118/72   Pulse 76   Temp 97.6 F (36.4 C)  Ht 5\' 7"  (1.702 m)   Wt 165 lb 6.4 oz (75 kg)   SpO2 99%   BMI 25.91 kg/m  Wt Readings from Last 3 Encounters:  07/08/23 165 lb 6.4 oz (75 kg)  05/18/23 160 lb (72.6 kg)  04/07/23 165 lb 6 oz (75 kg)     Health Maintenance  Topic Date Due   COVID-19 Vaccine (6 - 2024-25 season) 07/23/2023 (Originally 01/02/2023)   MAMMOGRAM  11/01/2023   Medicare Annual Wellness (AWV)  05/17/2024   DTaP/Tdap/Td (3 - Td or Tdap) 04/06/2032   Pneumonia Vaccine 52+ Years old  Completed   INFLUENZA VACCINE  Completed   DEXA SCAN  Completed   Zoster Vaccines- Shingrix  Completed   HPV VACCINES  Aged Out   Hepatitis C Screening  Discontinued    There are no preventive care reminders to display for this patient.  Lab Results  Component Value Date   TSH 3.70 06/23/2022   Lab Results  Component Value Date   WBC 5.2 06/23/2022   HGB 15.1 (H) 06/23/2022   HCT 45.3 06/23/2022   MCV 99.8 06/23/2022   PLT 258.0 06/23/2022   Lab Results  Component Value Date   NA 142 06/23/2022   K 4.6 06/23/2022   CO2 24 06/23/2022   GLUCOSE 89 06/23/2022   BUN 15 06/23/2022   CREATININE 0.87 06/23/2022   BILITOT 0.7 06/23/2022   ALKPHOS 62 06/23/2022   AST 25 06/23/2022   ALT 20 06/23/2022   PROT 7.7 06/23/2022   ALBUMIN 4.8 06/23/2022   CALCIUM 10.4 06/23/2022   GFR 62.81 06/23/2022   Lab Results  Component Value Date   CHOL 212 (H) 06/23/2022   Lab Results  Component Value Date   HDL 82 06/23/2022   Lab Results  Component Value Date   LDLCALC 103 (H) 06/23/2022   Lab Results  Component Value Date   TRIG 159 (H) 06/23/2022   Lab Results  Component Value Date   CHOLHDL 2.6 06/23/2022    No results found for: "HGBA1C"    Assessment & Plan:  URI with cough and congestion Assessment & Plan: Symptoms include cough, chest tightness, nasal congestion, postnasal drip, scratchy throat, body aches, chills, and headache. No fever or shortness of breath. Negative for COVID and flu. -Start antibiotic therapy. -Continue with Flonase nasal spray. -Use Mucinex to help break up congestion. -Continue with cough syrup as needed.    Chills -     POC COVID-19 BinaxNow -     POCT Influenza A/B  Cough, unspecified type -     POC COVID-19 BinaxNow -     POCT Influenza A/B -     Hydrocod Poli-Chlorphe Poli ER; Take 5 mLs by mouth at bedtime as needed.  Dispense: 70 mL; Refill: 0  Other orders -     Amoxicillin-Pot Clavulanate; Take 1 tablet by mouth 2 (two) times daily.  Dispense: 20 tablet; Refill: 0 -     Fluticasone Propionate; Place 2 sprays into both nostrils daily.  Dispense: 16 g; Refill: 6 -     guaiFENesin ER; Take 1 tablet (600 mg total) by mouth 2 (two) times daily.  Dispense: 30 tablet; Refill: 0    Follow-up: No follow-ups on file.   Kara Dies, NP

## 2023-07-17 NOTE — Assessment & Plan Note (Signed)
 Symptoms include cough, chest tightness, nasal congestion, postnasal drip, scratchy throat, body aches, chills, and headache. No fever or shortness of breath. Negative for COVID and flu. -Start antibiotic therapy. -Continue with Flonase nasal spray. -Use Mucinex to help break up congestion. -Continue with cough syrup as needed.

## 2023-07-20 ENCOUNTER — Telehealth: Payer: Self-pay

## 2023-07-20 NOTE — Telephone Encounter (Signed)
 Copied from CRM [id number]. Topic: General - Other >> Jul 20, 2023  3:48 PM Truddie Crumble wrote: Reason for CRM: patient called stating she has completed her prescription and is doing much better. Patient states she is still having alot of drainage and dry cough so she took zyrtec this morning and benadryl tonight. Patient want to know is there something else that she should be taking

## 2023-07-21 DIAGNOSIS — D485 Neoplasm of uncertain behavior of skin: Secondary | ICD-10-CM | POA: Diagnosis not present

## 2023-07-21 DIAGNOSIS — H6123 Impacted cerumen, bilateral: Secondary | ICD-10-CM | POA: Diagnosis not present

## 2023-07-21 DIAGNOSIS — H903 Sensorineural hearing loss, bilateral: Secondary | ICD-10-CM | POA: Diagnosis not present

## 2023-07-21 NOTE — Telephone Encounter (Signed)
 Advise pt to increase hydration, use steam and humidifier. She can also try mucinex (Guaifenesin). Let us know if not improving.

## 2023-07-22 DIAGNOSIS — L728 Other follicular cysts of the skin and subcutaneous tissue: Secondary | ICD-10-CM | POA: Diagnosis not present

## 2023-07-22 DIAGNOSIS — D485 Neoplasm of uncertain behavior of skin: Secondary | ICD-10-CM | POA: Diagnosis not present

## 2023-07-22 DIAGNOSIS — Z85828 Personal history of other malignant neoplasm of skin: Secondary | ICD-10-CM | POA: Diagnosis not present

## 2023-07-22 DIAGNOSIS — L570 Actinic keratosis: Secondary | ICD-10-CM | POA: Diagnosis not present

## 2023-07-22 DIAGNOSIS — L821 Other seborrheic keratosis: Secondary | ICD-10-CM | POA: Diagnosis not present

## 2023-07-22 NOTE — Telephone Encounter (Signed)
 Called Patient and gave her Charanpreet Kaur's recommendation of increasing hydration, using steam & the humidifier. Charanpreet Evelene Croon also advises to take Mucinex and continue the Zyrtec and Stop the Benadryl. Patient understands and is agreeable. Patient will let us know if the symptoms do not improve.

## 2023-08-04 ENCOUNTER — Encounter: Payer: Self-pay | Admitting: Internal Medicine

## 2023-08-04 ENCOUNTER — Ambulatory Visit (INDEPENDENT_AMBULATORY_CARE_PROVIDER_SITE_OTHER): Payer: Medicare Other | Admitting: Internal Medicine

## 2023-08-04 VITALS — BP 116/74 | HR 72 | Temp 97.6°F | Ht 67.0 in | Wt 165.0 lb

## 2023-08-04 DIAGNOSIS — Z853 Personal history of malignant neoplasm of breast: Secondary | ICD-10-CM | POA: Diagnosis not present

## 2023-08-04 DIAGNOSIS — K5901 Slow transit constipation: Secondary | ICD-10-CM

## 2023-08-04 DIAGNOSIS — Z780 Asymptomatic menopausal state: Secondary | ICD-10-CM

## 2023-08-04 DIAGNOSIS — R5383 Other fatigue: Secondary | ICD-10-CM | POA: Diagnosis not present

## 2023-08-04 DIAGNOSIS — Z1231 Encounter for screening mammogram for malignant neoplasm of breast: Secondary | ICD-10-CM

## 2023-08-04 DIAGNOSIS — M8580 Other specified disorders of bone density and structure, unspecified site: Secondary | ICD-10-CM

## 2023-08-04 DIAGNOSIS — R7301 Impaired fasting glucose: Secondary | ICD-10-CM

## 2023-08-04 DIAGNOSIS — E782 Mixed hyperlipidemia: Secondary | ICD-10-CM | POA: Diagnosis not present

## 2023-08-04 LAB — COMPREHENSIVE METABOLIC PANEL WITH GFR
ALT: 18 U/L (ref 0–35)
AST: 26 U/L (ref 0–37)
Albumin: 4.3 g/dL (ref 3.5–5.2)
Alkaline Phosphatase: 59 U/L (ref 39–117)
BUN: 15 mg/dL (ref 6–23)
CO2: 23 meq/L (ref 19–32)
Calcium: 9.4 mg/dL (ref 8.4–10.5)
Chloride: 105 meq/L (ref 96–112)
Creatinine, Ser: 0.76 mg/dL (ref 0.40–1.20)
GFR: 73.29 mL/min (ref >60.00)
Glucose, Bld: 76 mg/dL (ref 70–99)
Potassium: 4 meq/L (ref 3.5–5.1)
Sodium: 138 meq/L (ref 135–145)
Total Bilirubin: 0.6 mg/dL (ref 0.2–1.2)
Total Protein: 7.3 g/dL (ref 6.0–8.3)

## 2023-08-04 LAB — CBC WITH DIFFERENTIAL/PLATELET
Basophils Absolute: 0 10*3/uL (ref 0.0–0.1)
Basophils Relative: 0.4 % (ref 0.0–3.0)
Eosinophils Absolute: 0.1 10*3/uL (ref 0.0–0.7)
Eosinophils Relative: 1.5 % (ref 0.0–5.0)
HCT: 43.4 % (ref 36.0–46.0)
Hemoglobin: 14.4 g/dL (ref 12.0–15.0)
Lymphocytes Relative: 36.8 % (ref 12.0–46.0)
Lymphs Abs: 1.9 10*3/uL (ref 0.7–4.0)
MCHC: 33.1 g/dL (ref 30.0–36.0)
MCV: 100.3 fl — ABNORMAL HIGH (ref 78.0–100.0)
Monocytes Absolute: 0.6 10*3/uL (ref 0.1–1.0)
Monocytes Relative: 10.7 % (ref 3.0–12.0)
Neutro Abs: 2.7 10*3/uL (ref 1.4–7.7)
Neutrophils Relative %: 50.6 % (ref 43.0–77.0)
Platelets: 222 10*3/uL (ref 150.0–400.0)
RBC: 4.33 Mil/uL (ref 3.87–5.11)
RDW: 13.1 % (ref 11.5–15.5)
WBC: 5.3 10*3/uL (ref 4.0–10.5)

## 2023-08-04 LAB — LIPID PANEL
Cholesterol: 174 mg/dL (ref 0–200)
HDL: 70.6 mg/dL (ref >39.00)
LDL Cholesterol: 78 mg/dL (ref 0–99)
NonHDL: 103.74
Total CHOL/HDL Ratio: 2
Triglycerides: 127 mg/dL (ref 0.0–149.0)
VLDL: 25.4 mg/dL (ref 0.0–40.0)

## 2023-08-04 LAB — LDL CHOLESTEROL, DIRECT: Direct LDL: 81 mg/dL

## 2023-08-04 LAB — HEMOGLOBIN A1C: Hgb A1c MFr Bld: 5.7 % (ref 4.6–6.5)

## 2023-08-04 LAB — TSH: TSH: 2.32 u[IU]/mL (ref 0.35–5.50)

## 2023-08-04 NOTE — Progress Notes (Signed)
 Patient ID: Lori Caldwell, female    DOB: [birth year]  Age: 83 y.o. MRN: [id number]  The patient is here for follow up and  management of other chronic and acute problems.   The risk factors are reflected in the social history.   The roster of all physicians providing medical care to patient - is listed in the Snapshot section of the chart.   Activities of daily living:  The patient is 100% independent in all ADLs: dressing, toileting, feeding as well as independent mobility   Home safety : The patient has smoke detectors in the home. They wear seatbelts.  There are no unsecured firearms at home. There is no violence in the home.    There is no risks for hepatitis, STDs or HIV. There is no   history of blood transfusion. They have no travel history to infectious disease endemic areas of the world.   The patient has seen their dentist in the last six month. They have seen their eye doctor in the last year. The patinet  denies slight hearing difficulty with regard to whispered voices and some television programs.  They have deferred audiologic testing in the last year.  They do not  have excessive sun exposure. Discussed the need for sun protection: hats, long sleeves and use of sunscreen if there is significant sun exposure.    Diet: the importance of a healthy diet is discussed. They do have a healthy diet.   The benefits of regular aerobic exercise were discussed. The patient  walks 5 days per week  for  60 minutes.    Depression screen: there are no signs or vegative symptoms of depression- irritability, change in appetite, anhedonia, sadness/tearfullness.   The following portions of the patient's history were reviewed and updated as appropriate: allergies, current medications, past family history, past medical history,  past surgical history, past social history  and problem list.   Visual acuity was not assessed per patient preference since the patient has regular follow up with an   ophthalmologist. Hearing and body mass index were assessed and reviewed.    During the course of the visit the patient was educated and counseled about appropriate screening and preventive services including : fall prevention , diabetes screening, nutrition counseling, colorectal cancer screening, and recommended immunizations.    Chief Complaint:  Chronic constipation :  using Benefiber twice daily to achieve  daily BM .  No prior trial of motiity agents.     Review of Symptoms  Patient denies headache, fevers, malaise, unintentional weight loss, skin rash, eye pain, sinus congestion and sinus pain, sore throat, dysphagia,  hemoptysis , cough, dyspnea, wheezing, chest pain, palpitations, orthopnea, edema, abdominal pain, nausea, melena, diarrhea, constipation, flank pain, dysuria, hematuria, urinary  Frequency, nocturia, numbness, tingling, seizures,  Focal weakness, Loss of consciousness,  Tremor, insomnia, depression, anxiety, and suicidal ideation.    Physical Exam:  BP 116/74 (BP Location: Left Arm, Patient Position: Sitting, Cuff Size: Normal)   Pulse 72   Temp 97.6 F (36.4 C) (Temporal)   Ht 5\' 7"  (1.702 m)   Wt 165 lb (74.8 kg)   SpO2 100%   BMI 25.84 kg/m    Physical Exam Vitals reviewed.  Constitutional:      General: She is not in acute distress.    Appearance: Normal appearance. She is well-developed and normal weight. She is not ill-appearing, toxic-appearing or diaphoretic.  HENT:     Head: Normocephalic.     Right Ear: Tympanic  membrane, ear canal and external ear normal. There is no impacted cerumen.     Left Ear: Tympanic membrane, ear canal and external ear normal. There is no impacted cerumen.     Nose: Nose normal.     Mouth/Throat:     Mouth: Mucous membranes are moist.     Pharynx: Oropharynx is clear.  Eyes:     General: No scleral icterus.       Right eye: No discharge.        Left eye: No discharge.     Conjunctiva/sclera: Conjunctivae normal.      Pupils: Pupils are equal, round, and reactive to light.  Neck:     Thyroid: No thyromegaly.     Vascular: No carotid bruit or JVD.  Cardiovascular:     Rate and Rhythm: Normal rate and regular rhythm.     Heart sounds: Normal heart sounds.  Pulmonary:     Effort: Pulmonary effort is normal. No respiratory distress.     Breath sounds: Normal breath sounds.  Chest:  Breasts:    Breasts are symmetrical.     Right: Absent. No swelling, inverted nipple, mass, nipple discharge, skin change or tenderness.     Left: Normal. No swelling, inverted nipple, mass, nipple discharge, skin change or tenderness.     Comments: Right mastectomy Abdominal:     General: Bowel sounds are normal.     Palpations: Abdomen is soft. There is no mass.     Tenderness: There is no abdominal tenderness. There is no guarding or rebound.  Musculoskeletal:        General: Normal range of motion.     Cervical back: Normal range of motion and neck supple.  Lymphadenopathy:     Cervical: No cervical adenopathy.     Upper Body:     Right upper body: No supraclavicular, axillary or pectoral adenopathy.     Left upper body: No supraclavicular, axillary or pectoral adenopathy.  Skin:    General: Skin is warm and dry.  Neurological:     General: No focal deficit present.     Mental Status: She is alert and oriented to person, place, and time. Mental status is at baseline.  Psychiatric:        Mood and Affect: Mood normal.        Behavior: Behavior normal.        Thought Content: Thought content normal.        Judgment: Judgment normal.    Assessment and Plan: Encounter for screening mammogram for malignant neoplasm of breast -     3D Screening Mammogram, Left; Future  Mixed hyperlipidemia Assessment & Plan: Managed with zocor.  LDL and triglycerides were  at goal on current medication s .  LFTs are normal.     Lab Results  Component Value Date   CHOL 174 08/04/2023   HDL 70.60 08/04/2023   LDLCALC 78  08/04/2023   LDLDIRECT 81.0 08/04/2023   TRIG 127.0 08/04/2023   CHOLHDL 2 08/04/2023   Lab Results  Component Value Date   ALT 18 08/04/2023   AST 26 08/04/2023   ALKPHOS 59 08/04/2023   BILITOT 0.6 08/04/2023     Orders: -     Lipid panel -     LDL cholesterol, direct  Other fatigue -     CBC with Differential/Platelet -     TSH  Impaired fasting glucose -     Comprehensive metabolic panel with GFR -  Hemoglobin A1c  Personal history of breast cancer Assessment & Plan: Continue annual unilateral screening mammograms,  due July 2025    Slow transit constipation Assessment & Plan: Chronic for 1.5 years  colonoscopy unrevealing.  Continue benefier  bid,  colace daily .  WILL INITIATE TRIAL OF AMITIZA when she returns from vacation   Osteopenia due to cancer therapy Assessment & Plan: Last DEXA 2021 at [facility].  Repeat DEXa IN 2026.  TAKING VITAMIN D DAILY DOSE    Postmenopausal estrogen deficiency -     DG Bone Density; Future    No follow-ups on file.  Sherlene Shams, MD

## 2023-08-04 NOTE — Patient Instructions (Addendum)
 You do not need to have a BM daily, every other day is fine  You can add miralax once daily  or every other day,  as a complement to benefiber/colace  If you would like to try Amitiza for chronic constipation,  it is covered by insurance    I do recommend the RSV vaccine for you, after your trip to [facility] .  It is now available through your pharmacy    Your annual mammogram has been ordered AND IS DUE in [place].  [facility] will not allow [place] to schedule it for you,  so please  call to make your appointment [phone]    YOU ARE DUE FOR A REPEAT BONE DENSITY  TEST (LAST ONE 2021)  AT [facility].  THIS HAS BEEN ORDERED

## 2023-08-04 NOTE — Assessment & Plan Note (Signed)
 Last DEXA 2021 at [facility].  Repeat DEXa IN 2026.  TAKING VITAMIN D DAILY DOSE

## 2023-08-04 NOTE — Assessment & Plan Note (Signed)
 Managed with zocor.  LDL and triglycerides were  at goal on current medication s .  LFTs are normal.     Lab Results  Component Value Date   CHOL 174 08/04/2023   HDL 70.60 08/04/2023   LDLCALC 78 08/04/2023   LDLDIRECT 81.0 08/04/2023   TRIG 127.0 08/04/2023   CHOLHDL 2 08/04/2023   Lab Results  Component Value Date   ALT 18 08/04/2023   AST 26 08/04/2023   ALKPHOS 59 08/04/2023   BILITOT 0.6 08/04/2023

## 2023-08-04 NOTE — Assessment & Plan Note (Addendum)
 Chronic for 1.5 years  colonoscopy unrevealing.  Continue benefier  bid,  colace daily .  WILL INITIATE TRIAL OF AMITIZA when she returns from vacation

## 2023-08-04 NOTE — Assessment & Plan Note (Signed)
 Continue annual unilateral screening mammograms,  due July 2025

## 2023-08-07 ENCOUNTER — Encounter: Payer: Self-pay | Admitting: Internal Medicine

## 2023-08-26 DIAGNOSIS — B078 Other viral warts: Secondary | ICD-10-CM | POA: Diagnosis not present

## 2023-08-26 DIAGNOSIS — L812 Freckles: Secondary | ICD-10-CM | POA: Diagnosis not present

## 2023-08-26 DIAGNOSIS — D692 Other nonthrombocytopenic purpura: Secondary | ICD-10-CM | POA: Diagnosis not present

## 2023-08-26 DIAGNOSIS — D225 Melanocytic nevi of trunk: Secondary | ICD-10-CM | POA: Diagnosis not present

## 2023-08-26 DIAGNOSIS — L821 Other seborrheic keratosis: Secondary | ICD-10-CM | POA: Diagnosis not present

## 2023-08-26 DIAGNOSIS — Z85828 Personal history of other malignant neoplasm of skin: Secondary | ICD-10-CM | POA: Diagnosis not present

## 2023-08-26 DIAGNOSIS — Z1283 Encounter for screening for malignant neoplasm of skin: Secondary | ICD-10-CM | POA: Diagnosis not present

## 2023-09-27 ENCOUNTER — Ambulatory Visit (INDEPENDENT_AMBULATORY_CARE_PROVIDER_SITE_OTHER): Admitting: Family Medicine

## 2023-09-27 ENCOUNTER — Encounter: Payer: Self-pay | Admitting: Family Medicine

## 2023-09-27 VITALS — BP 114/68 | HR 79 | Temp 97.8°F | Resp 20 | Ht 67.0 in | Wt 166.1 lb

## 2023-09-27 DIAGNOSIS — R58 Hemorrhage, not elsewhere classified: Secondary | ICD-10-CM

## 2023-09-27 NOTE — Patient Instructions (Addendum)
 It was a pleasure meeting you today. Thank you for allowing me to take part in your health care.  Our goals for today as we discussed include:  Continue with using compression stockings when standing and for long trips.  Recommend frequent standing from sitting when on airplane and driving.    If area gets bigger, painful or any swelling in the leg, please follow up with PCP   This is a list of the screening recommended for you and due dates:  Health Maintenance  Topic Date Due   COVID-19 Vaccine (6 - 2024-25 season) 01/02/2023   Mammogram  11/01/2023   Flu Shot  12/02/2023   Medicare Annual Wellness Visit  05/17/2024   DTaP/Tdap/Td vaccine (3 - Td or Tdap) 04/06/2032   Pneumonia Vaccine  Completed   DEXA scan (bone density measurement)  Completed   Zoster (Shingles) Vaccine  Completed   HPV Vaccine  Aged Out   Meningitis B Vaccine  Aged Out   Hepatitis C Screening  Discontinued      If you have any questions or concerns, please do not hesitate to call the office at [phone].  I look forward to our next visit and until then take care and stay safe.  Regards,   Valli Gaw, MD   [facility]

## 2023-09-27 NOTE — Progress Notes (Signed)
 SUBJECTIVE:   Chief Complaint  Patient presents with   Brusies    On left leg since last Wednesday    HPI Presents for acute visit  Discussed the use of AI scribe software for clinical note transcription with the patient, who gave verbal consent to proceed.  History of Present Illness Lori Caldwell "Lori Caldwell" is an 83 year old female who presents with new brown spots on her legs.  She noticed new brown spots on her legs for the first time on Wednesday, distinct from older spots present for ten years. There is no associated pain, swelling, or recent injury. The spots appeared after her usual exercise routine, which includes walking a mile and a half every other day and doing exercises with weights at home.  She is currently taking baby aspirin. She denies any recent trauma or friction from clothing or shoes.   She is concerned about the discoloration and has been elevating her legs since noticing the spots. She is apprehensive about wearing compression stockings due to the fragility of her skin, which tears easily when putting them on.  No pain, swelling, or recent injury to the legs. No active bleeding or significant changes in the spots since they appeared.     PERTINENT PMH / PSH: As above  OBJECTIVE:  BP 114/68   Pulse 79   Temp 97.8 F (36.6 C)   Resp 20   Ht 5\' 7"  (1.702 m)   Wt 166 lb 2 oz (75.4 kg)   SpO2 99%   BMI 26.02 kg/m    Physical Exam Vitals reviewed.  Constitutional:      General: She is not in acute distress.    Appearance: Normal appearance. She is normal weight. She is not ill-appearing, toxic-appearing or diaphoretic.  Eyes:     General:        Right eye: No discharge.        Left eye: No discharge.     Conjunctiva/sclera: Conjunctivae normal.  Cardiovascular:     Rate and Rhythm: Normal rate and regular rhythm.     Heart sounds: Normal heart sounds.  Pulmonary:     Effort: Pulmonary effort is normal.     Breath sounds: Normal breath  sounds.  Musculoskeletal:        General: No swelling, tenderness or signs of injury. Normal range of motion.     Right lower leg: No edema.     Left lower leg: No edema.  Skin:    General: Skin is warm and dry.     Findings: No rash.  Neurological:     General: No focal deficit present.     Mental Status: She is alert and oriented to person, place, and time. Mental status is at baseline.  Psychiatric:        Mood and Affect: Mood normal.        Behavior: Behavior normal.        Thought Content: Thought content normal.        Judgment: Judgment normal.             09/27/2023    9:06 AM 08/04/2023    9:22 AM 07/08/2023    9:36 AM 05/18/2023    3:04 PM 04/07/2023    1:35 PM  Depression screen PHQ 2/9  Decreased Interest 0 0 0 0 0  Down, Depressed, Hopeless 0 0 0 0 0  PHQ - 2 Score 0 0 0 0 0  Altered sleeping 0 0 0  0 0  Tired, decreased energy 0 0 0 0 0  Change in appetite 0 0 0 0 0  Feeling bad or failure about yourself  0 0 0 0 0  Trouble concentrating 0 0 0 0 0  Moving slowly or fidgety/restless 0 0 0 0 0  Suicidal thoughts 0 0 0 0 0  PHQ-9 Score 0 0 0 0 0  Difficult doing work/chores Not difficult at all  Not difficult at all Not difficult at all Not difficult at all      09/27/2023    9:06 AM 08/04/2023    9:23 AM 07/08/2023    9:37 AM 04/07/2023    1:36 PM  GAD 7 : Generalized Anxiety Score  Nervous, Anxious, on Edge 0 0 0 0  Control/stop worrying 0 0 0 0  Worry too much - different things 0 0 0 0  Trouble relaxing 0 0 0 0  Restless 0 0 0 0  Easily annoyed or irritable 0 0 0 0  Afraid - awful might happen 0 0 0 0  Total GAD 7 Score 0 0 0 0  Anxiety Difficulty Not difficult at all Not difficult at all Not difficult at all Not difficult at all    ASSESSMENT/PLAN:  Ecchymosis Assessment & Plan: Likely due to fragile skin and spontaneous rupture of superficial vessels. No active bleeding or infection. Hemosiderin deposits may cause persistent discoloration. Concerns  about compression stockings due to skin fragility. - Advise wearing compression stockings during the day, especially when standing or exercising, but not during sleep. - Instruct on proper technique for applying compression stockings to avoid skin tears. - Advise leg elevation on pillows when resting. - Continue walking and exercising as tolerated. - Take a picture of the affected area for future comparison. - Contact clinic if symptoms worsen.       PDMP reviewed  Return if symptoms worsen or fail to improve, for PCP.  Valli Gaw, MD

## 2023-10-02 ENCOUNTER — Encounter: Payer: Self-pay | Admitting: Family Medicine

## 2023-10-02 DIAGNOSIS — R58 Hemorrhage, not elsewhere classified: Secondary | ICD-10-CM | POA: Insufficient documentation

## 2023-10-02 NOTE — Assessment & Plan Note (Signed)
 Likely due to fragile skin and spontaneous rupture of superficial vessels. No active bleeding or infection. Hemosiderin deposits may cause persistent discoloration. Concerns about compression stockings due to skin fragility. - Advise wearing compression stockings during the day, especially when standing or exercising, but not during sleep. - Instruct on proper technique for applying compression stockings to avoid skin tears. - Advise leg elevation on pillows when resting. - Continue walking and exercising as tolerated. - Take a picture of the affected area for future comparison. - Contact clinic if symptoms worsen.

## 2023-11-22 ENCOUNTER — Encounter: Payer: Self-pay | Admitting: Internal Medicine

## 2023-11-22 ENCOUNTER — Ambulatory Visit (INDEPENDENT_AMBULATORY_CARE_PROVIDER_SITE_OTHER): Admitting: Internal Medicine

## 2023-11-22 VITALS — BP 112/80 | HR 80 | Ht 67.0 in | Wt 167.6 lb

## 2023-11-22 DIAGNOSIS — R58 Hemorrhage, not elsewhere classified: Secondary | ICD-10-CM | POA: Diagnosis not present

## 2023-11-22 DIAGNOSIS — K5900 Constipation, unspecified: Secondary | ICD-10-CM | POA: Diagnosis not present

## 2023-11-22 DIAGNOSIS — E782 Mixed hyperlipidemia: Secondary | ICD-10-CM | POA: Diagnosis not present

## 2023-11-22 DIAGNOSIS — Z1231 Encounter for screening mammogram for malignant neoplasm of breast: Secondary | ICD-10-CM | POA: Diagnosis not present

## 2023-11-22 MED ORDER — LUBIPROSTONE 8 MCG PO CAPS: 8.0000 ug | ORAL_CAPSULE | Freq: Two times a day (BID) | ORAL | 2 refills | Status: DC

## 2023-11-22 NOTE — Patient Instructions (Addendum)
 YOUR MAMMOGRAM  was ordered in April  ,so PLEASE CALL AND GET THIS SCHEDULED! [facility] - call [phone]    Try the amitiza  for the constipation. Start with one dose daily  in the AM and reduce your dose of  benefiber to nighttime only .  You can get the rest by eating a  daily [place]  whole wheat tortilla   You can increase the amitiza  to 2 times daily if needed to produce a BM.  I started you on the lowest dose .  We can continue to  increase the dose if needed,  first  to 16 mcg . The  maximal dose will be 24 mcg twice daily  Wear the stockings  to prevent swelling and protect from superficial trauma/

## 2023-11-22 NOTE — Progress Notes (Unsigned)
 Subjective:  Patient ID: Lori Caldwell, female    DOB: Jul 09, 1940  Age: 83 y.o. MRN: [id number]  CC: The primary encounter diagnosis was Encounter for screening mammogram for malignant neoplasm of breast. Diagnoses of Mixed hyperlipidemia, Ecchymosis, and Constipation, unspecified constipation type were also pertinent to this visit.   HPI Lori Caldwell presents for  Chief Complaint  Patient presents with   Medical Management of Chronic Issues   1) chronic constipation:  despite using Benefiber up to tid, and 2 stool softeners nightly.  Averaging a BM every other day. Has abd pain when she goes over 24 hours with out a BM    2) leg bruising  ;seen  6 weeks ago by TW for spontaneous ecchymosis of lower extremities attributed to hemorrhage of superficial BS/capillaries.  Advised to use compression knee highs,which she has been wearing .    Outpatient Medications Prior to Visit  Medication Sig Dispense Refill   aspirin 81 MG tablet Take 81 mg by mouth daily.     Cholecalciferol 25 MCG (1000 UT) tablet Take by mouth.     Docusate Sodium (DSS) 100 MG CAPS Take 2 capsules by mouth at bedtime.     Multiple Vitamin (MULTIVITAMIN) tablet Take 1 tablet by mouth daily.     simvastatin  (ZOCOR ) 40 MG tablet Take 1 tablet (40 mg total) by mouth every evening. 90 tablet 1   Wheat Dextrin (BENEFIBER DRINK MIX PO) Take 1 Package by mouth 2 (two) times daily.     No facility-administered medications prior to visit.    Review of Systems;  Patient denies headache, fevers, malaise, unintentional weight loss, skin rash, eye pain, sinus congestion and sinus pain, sore throat, dysphagia,  hemoptysis , cough, dyspnea, wheezing, chest pain, palpitations, orthopnea, edema, abdominal pain, nausea, melena, diarrhea, , flank pain, dysuria, hematuria, urinary  Frequency, nocturia, numbness, tingling, seizures,  Focal weakness, Loss of consciousness,  Tremor, insomnia, depression, anxiety, and suicidal  ideation.      Objective:  BP 112/80   Pulse 80   Ht 5' 7 (1.702 m)   Wt 167 lb 9.6 oz (76 kg)   SpO2 99%   BMI 26.25 kg/m   BP Readings from Last 3 Encounters:  11/22/23 112/80  09/27/23 114/68  08/04/23 116/74    Wt Readings from Last 3 Encounters:  11/22/23 167 lb 9.6 oz (76 kg)  09/27/23 166 lb 2 oz (75.4 kg)  08/04/23 165 lb (74.8 kg)    Physical Exam  Lab Results  Component Value Date   HGBA1C 5.7 08/04/2023    Lab Results  Component Value Date   CREATININE 0.76 08/04/2023   CREATININE 0.87 06/23/2022   CREATININE 0.74 10/09/2021    Lab Results  Component Value Date   WBC 5.3 08/04/2023   HGB 14.4 08/04/2023   HCT 43.4 08/04/2023   PLT 222.0 08/04/2023   GLUCOSE 76 08/04/2023   CHOL 174 08/04/2023   TRIG 127.0 08/04/2023   HDL 70.60 08/04/2023   LDLDIRECT 81.0 08/04/2023   LDLCALC 78 08/04/2023   ALT 18 08/04/2023   AST 26 08/04/2023   NA 138 08/04/2023   K 4.0 08/04/2023   CL 105 08/04/2023   CREATININE 0.76 08/04/2023   BUN 15 08/04/2023   CO2 23 08/04/2023   TSH 2.32 08/04/2023   HGBA1C 5.7 08/04/2023    No results found.  Assessment & Plan:  .Encounter for screening mammogram for malignant neoplasm of breast  Mixed hyperlipidemia -  TSH; Future -     Comprehensive metabolic panel with GFR; Future -     Lipid panel; Future  Ecchymosis Assessment & Plan: Secondary to superficial vein hemorrhage .   continue use of compression stockings   Orders: -     CBC with Differential/Platelet; Future  Constipation, unspecified constipation type Assessment & Plan: Persistent despite use of stool softeners and fiber supplements.  Trial of amitia at lowest dose    Other orders -     Lubiprostone ; Take 1 capsule (8 mcg total) by mouth 2 (two) times daily with a meal.  Dispense: 60 capsule; Refill: 2   .   Follow-up: Return in about 6 months (around 05/24/2024).   Verneita LITTIE Kettering, MD

## 2023-11-23 NOTE — Assessment & Plan Note (Signed)
 Secondary to superficial vein hemorrhage .   continue use of compression stockings

## 2023-11-23 NOTE — Assessment & Plan Note (Signed)
 Persistent despite use of stool softeners and fiber supplements.  Trial of amitia at lowest dose

## 2023-12-07 ENCOUNTER — Ambulatory Visit
Admission: RE | Admit: 2023-12-07 | Discharge: 2023-12-07 | Disposition: A | Discharge status: Home / Self Care | Source: Ambulatory Visit | Attending: Internal Medicine | Admitting: Internal Medicine

## 2023-12-07 DIAGNOSIS — Z1231 Encounter for screening mammogram for malignant neoplasm of breast: Secondary | ICD-10-CM | POA: Diagnosis not present

## 2024-01-17 ENCOUNTER — Other Ambulatory Visit: Payer: Self-pay | Admitting: Internal Medicine

## 2024-02-19 ENCOUNTER — Encounter: Payer: Self-pay | Admitting: Internal Medicine

## 2024-02-20 ENCOUNTER — Ambulatory Visit: Payer: Self-pay | Admitting: *Deleted

## 2024-02-20 NOTE — Telephone Encounter (Signed)
 Pt is scheduled to see Kenney Roys, NP on Tuesday. Pt would like to know if there is anything that she can take for the arm pain until then?

## 2024-02-20 NOTE — Telephone Encounter (Signed)
 Copied from CRM [id number]. Topic: Clinical - Red Word Triage >> Feb 20, 2024  8:31 AM Viola F wrote: Red Word that prompted transfer to Nurse Triage: Patient having pain in right arm, requested appt Reason for Disposition  [1] MODERATE pain (e.g., interferes with normal activities) AND [2] present > 3 days  Answer Assessment - Initial Assessment Questions 1. ONSET: When did the pain start?     1 week ago 2. LOCATION: Where is the pain located?     R arm- shoulder to upper arm 3. PAIN: How bad is the pain? (Scale 0-10; or none, mild, moderate, severe)     7/10- can't sleep at night 4. WORK OR EXERCISE: Has there been any recent work or exercise that involved this part of the body?     Patient did do yard work- not sure if she pulled something, knits 5. CAUSE: What do you think is causing the arm pain?     Last 3 nights hard to sleep 6. OTHER SYMPTOMS: Do you have any other symptoms? (e.g., neck pain, swelling, rash, fever, numbness, weakness)     no  Protocols used: Arm Pain-A-AH

## 2024-02-21 ENCOUNTER — Ambulatory Visit (INDEPENDENT_AMBULATORY_CARE_PROVIDER_SITE_OTHER): Admitting: Family

## 2024-02-21 ENCOUNTER — Ambulatory Visit (INDEPENDENT_AMBULATORY_CARE_PROVIDER_SITE_OTHER)

## 2024-02-21 ENCOUNTER — Encounter: Payer: Self-pay | Admitting: Family

## 2024-02-21 VITALS — BP 150/68 | HR 72 | Temp 97.5°F | Ht 67.0 in | Wt 163.2 lb

## 2024-02-21 DIAGNOSIS — M79601 Pain in right arm: Secondary | ICD-10-CM | POA: Diagnosis not present

## 2024-02-21 DIAGNOSIS — M5412 Radiculopathy, cervical region: Secondary | ICD-10-CM

## 2024-02-21 DIAGNOSIS — M47812 Spondylosis without myelopathy or radiculopathy, cervical region: Secondary | ICD-10-CM | POA: Diagnosis not present

## 2024-02-21 DIAGNOSIS — M4187 Other forms of scoliosis, lumbosacral region: Secondary | ICD-10-CM | POA: Diagnosis not present

## 2024-02-21 DIAGNOSIS — M542 Cervicalgia: Secondary | ICD-10-CM | POA: Diagnosis not present

## 2024-02-21 DIAGNOSIS — M4802 Spinal stenosis, cervical region: Secondary | ICD-10-CM | POA: Diagnosis not present

## 2024-02-21 MED ORDER — METHYLPREDNISOLONE 4 MG PO TBPK: ORAL_TABLET | ORAL | 0 refills | Status: DC

## 2024-02-21 MED ORDER — TRAMADOL HCL 50 MG PO TABS: 50.0000 mg | ORAL_TABLET | Freq: Three times a day (TID) | ORAL | 0 refills | Status: AC | PRN

## 2024-02-21 NOTE — Progress Notes (Signed)
 Acute Office Visit  Subjective:     Patient ID: Lori Caldwell, female    DOB: 1940/06/13, 83 y.o.   MRN: [id number]  Chief Complaint  Patient presents with  . Arm Pain    HPI Patient is in today with c/o right arm pain that is a burning sensation to the right upper arm. Pain is worse at night, 10/10. Better during the day 6/10. Report doing a lot of yard word at her church 2 weeks ago when the pain initially began. She has been using Biofreeze that does not help. She also knits on a regular basis. No known injury or neck injury.   Review of Systems  Musculoskeletal:  Negative for back pain and joint pain.       Right upper arm burning pain  Neurological: Negative.   All other systems reviewed and are negative.  Past Medical History:  Diagnosis Date  . Breast cancer (HCC) 2000   RT MASTECTOMY  . GERD (gastroesophageal reflux disease)   . Hyperlipidemia   . Personal history of chemotherapy 2000   BREAST CA  . Personal history of radiation therapy 2000   BREAST CA    Social History   Socioeconomic History  . Marital status: Married    Spouse name: Zell  . Number of children: Not on file  . Years of education: Not on file  . Highest education level: Bachelor's degree (e.g., BA, AB, BS)  Occupational History  . Not on file  Tobacco Use  . Smoking status: Never  . Smokeless tobacco: Never  Vaping Use  . Vaping status: Never Used  Substance and Sexual Activity  . Alcohol use: Yes    Comment: OCC  . Drug use: No  . Sexual activity: Not on file  Other Topics Concern  . Not on file  Social History Narrative   Married    Lawyer    Social Drivers of Health   Financial Resource Strain: Low Risk  (05/18/2023)   Overall Financial Resource Strain (CARDIA)   . Difficulty of Paying Living Expenses: Not hard at all  Food Insecurity: No Food Insecurity (05/18/2023)   Hunger Vital Sign   . Worried About Programme researcher, broadcasting/film/video in the Last Year: Never true   .  Ran Out of Food in the Last Year: Never true  Transportation Needs: No Transportation Needs (05/18/2023)   PRAPARE - Transportation   . Lack of Transportation (Medical): No   . Lack of Transportation (Non-Medical): No  Physical Activity: Sufficiently Active (05/18/2023)   Exercise Vital Sign   . Days of Exercise per Week: 5 days   . Minutes of Exercise per Session: 30 min  Stress: No Stress Concern Present (05/18/2023)   [employer] of Occupational Health - Occupational Stress Questionnaire   . Feeling of Stress : Not at all  Social Connections: Socially Integrated (05/18/2023)   Social Connection and Isolation Panel   . Frequency of Communication with Friends and Family: More than three times a week   . Frequency of Social Gatherings with Friends and Family: More than three times a week   . Attends Religious Services: More than 4 times per year   . Active Member of Clubs or Organizations: Yes   . Attends Banker Meetings: More than 4 times per year   . Marital Status: Married  Catering manager Violence: Not At Risk (05/18/2023)   Humiliation, Afraid, Rape, and Kick questionnaire   . Fear of Current or  Ex-Partner: No   . Emotionally Abused: No   . Physically Abused: No   . Sexually Abused: No    Past Surgical History:  Procedure Laterality Date  . BREAST SURGERY     mastectomy, left breast   . COLONOSCOPY WITH PROPOFOL  N/A 02/10/2022   Procedure: COLONOSCOPY WITH PROPOFOL ;  Surgeon: Toledo, Ladell POUR, MD;  Location: [facility] ENDOSCOPY;  Service: Gastroenterology;  Laterality: N/A;  . MASTECTOMY Right 2000   BREAST CA  . ROBOTIC ASSISTED TOTAL HYSTERECTOMY  06/16/2021    Family History  Problem Relation Age of Onset  . Hypertension Mother   . Heart disease Father   . Cancer Maternal Grandmother   . Breast cancer Neg Hx     No Known Allergies  Current Outpatient Medications on File Prior to Visit  Medication Sig Dispense Refill  . aspirin 81 MG tablet Take  81 mg by mouth daily.    . Cholecalciferol 25 MCG (1000 UT) tablet Take by mouth.    . Docusate Sodium (DSS) 100 MG CAPS Take 2 capsules by mouth at bedtime.    . lubiprostone  (AMITIZA ) 8 MCG capsule Take 1 capsule (8 mcg total) by mouth 2 (two) times daily with a meal. 60 capsule 2  . Multiple Vitamin (MULTIVITAMIN) tablet Take 1 tablet by mouth daily.    . simvastatin  (ZOCOR ) 40 MG tablet Take 1 tablet (40 mg total) by mouth every evening. 90 tablet 1  . Wheat Dextrin (BENEFIBER DRINK MIX PO) Take 1 Package by mouth 2 (two) times daily.     No current facility-administered medications on file prior to visit.    BP (!) 150/68   Pulse 72   Temp (!) 97.5 F (36.4 C)   Ht 5' 7 (1.702 m)   Wt 163 lb 3.2 oz (74 kg)   SpO2 97%   BMI 25.56 kg/m chart      Objective:    BP (!) 150/68   Pulse 72   Temp (!) 97.5 F (36.4 C)   Ht 5' 7 (1.702 m)   Wt 163 lb 3.2 oz (74 kg)   SpO2 97%   BMI 25.56 kg/m    Physical Exam Vitals reviewed.  Constitutional:      Appearance: Normal appearance. She is normal weight.  Cardiovascular:     Pulses: Normal pulses.     Heart sounds: Normal heart sounds.  Pulmonary:     Effort: Pulmonary effort is normal.     Breath sounds: Normal breath sounds.  Musculoskeletal:        General: No swelling, tenderness, deformity or signs of injury. Normal range of motion.     Cervical back: Normal range of motion and neck supple. No rigidity or tenderness.  Skin:    General: Skin is warm and dry.  Neurological:     General: No focal deficit present.     Mental Status: She is alert and oriented to person, place, and time. Mental status is at baseline.  Psychiatric:        Mood and Affect: Mood normal.        Behavior: Behavior normal.    No results found for any visits on 02/21/24.      Assessment & Plan:   Problem List Items Addressed This Visit   None Visit Diagnoses       Right arm pain    -  Primary   Relevant Orders   DG Cervical  Spine Complete     Cervical radiculopathy  Relevant Orders   DG Cervical Spine Complete       Meds ordered this encounter  Medications  . methylPREDNISolone  (MEDROL  DOSEPAK) 4 MG TBPK tablet    Sig: As directed    Dispense:  21 tablet    Refill:  0  . traMADol (ULTRAM) 50 MG tablet    Sig: Take 1 tablet (50 mg total) by mouth every 8 (eight) hours as needed for up to 5 days.    Dispense:  15 tablet    Refill:  0    Xray performed today. Will notify patient pending results. Will proceed with MRI if symptoms persist. Recheck as scheduled and sooner as needed.   Catalino Plascencia B Burton Gahan, FNP

## 2024-02-23 ENCOUNTER — Ambulatory Visit: Payer: Self-pay | Admitting: Family

## 2024-02-27 ENCOUNTER — Encounter: Payer: Self-pay | Admitting: Internal Medicine

## 2024-02-28 ENCOUNTER — Encounter: Payer: Self-pay | Admitting: Internal Medicine

## 2024-02-28 DIAGNOSIS — M4692 Unspecified inflammatory spondylopathy, cervical region: Secondary | ICD-10-CM

## 2024-03-01 DIAGNOSIS — R238 Other skin changes: Secondary | ICD-10-CM | POA: Diagnosis not present

## 2024-03-01 DIAGNOSIS — H903 Sensorineural hearing loss, bilateral: Secondary | ICD-10-CM | POA: Diagnosis not present

## 2024-03-01 DIAGNOSIS — L810 Postinflammatory hyperpigmentation: Secondary | ICD-10-CM | POA: Diagnosis not present

## 2024-03-01 DIAGNOSIS — H6123 Impacted cerumen, bilateral: Secondary | ICD-10-CM | POA: Diagnosis not present

## 2024-03-01 DIAGNOSIS — Z85828 Personal history of other malignant neoplasm of skin: Secondary | ICD-10-CM | POA: Diagnosis not present

## 2024-03-01 DIAGNOSIS — Z1283 Encounter for screening for malignant neoplasm of skin: Secondary | ICD-10-CM | POA: Diagnosis not present

## 2024-03-01 DIAGNOSIS — D225 Melanocytic nevi of trunk: Secondary | ICD-10-CM | POA: Diagnosis not present

## 2024-03-01 DIAGNOSIS — L812 Freckles: Secondary | ICD-10-CM | POA: Diagnosis not present

## 2024-03-01 DIAGNOSIS — L821 Other seborrheic keratosis: Secondary | ICD-10-CM | POA: Diagnosis not present

## 2024-03-05 NOTE — Telephone Encounter (Signed)
 Copied from CRM [id number]. Topic: Referral - Request for Referral >> Mar 05, 2024 10:49 AM Frederich PARAS wrote: Did the patient discuss referral with their provider in the last year? Yes Via my chart messages   Appointment offered? No  Type of order/referral and detailed reason for visit: Physical therapy evaluation  Preference of office, provider, location: n/a   If referral order, have you been seen by this specialty before? No (If Yes, this issue or another issue? When? Where?  Can we respond through MyChart? Yes

## 2024-03-09 NOTE — Addendum Note (Signed)
 Addended by: MARYLYNN VERNEITA CROME on: 03/09/2024 01:06 PM   Modules accepted: Orders

## 2024-03-14 NOTE — Telephone Encounter (Signed)
 Copied from CRM [id number]. Topic: Referral - Request for Referral >> Mar 14, 2024  8:41 AM Victoria A wrote: Pt is following up on request for Physical Therapy please contact

## 2024-03-14 NOTE — Telephone Encounter (Signed)
 Pt would like to follow up on the referral that was placed on 03/05/2024.

## 2024-03-21 ENCOUNTER — Ambulatory Visit: Attending: Internal Medicine | Admitting: Physical Therapy

## 2024-03-21 ENCOUNTER — Encounter: Payer: Self-pay | Admitting: Physical Therapy

## 2024-03-21 DIAGNOSIS — M6281 Muscle weakness (generalized): Secondary | ICD-10-CM | POA: Insufficient documentation

## 2024-03-21 DIAGNOSIS — M5412 Radiculopathy, cervical region: Secondary | ICD-10-CM | POA: Insufficient documentation

## 2024-03-21 DIAGNOSIS — M79601 Pain in right arm: Secondary | ICD-10-CM | POA: Insufficient documentation

## 2024-03-21 DIAGNOSIS — M4692 Unspecified inflammatory spondylopathy, cervical region: Secondary | ICD-10-CM | POA: Insufficient documentation

## 2024-03-21 NOTE — Therapy (Signed)
 OUTPATIENT PHYSICAL THERAPY NECK EVALUATION   Patient Name: Lori Caldwell MRN: [id number] DOB:June 14, 1940, 83 y.o., female Today's Date: 03/21/2024  END OF SESSION:  PT End of Session - 03/21/24 1356     Visit Number 1    Number of Visits 13    Date for Recertification  05/02/24    Authorization Type Medicare A&B 2025    PT Start Time 1410    PT Stop Time 1453    PT Time Calculation (min) 43 min    Activity Tolerance Patient limited by pain    Behavior During Therapy [facility] for tasks assessed/performed          Past Medical History:  Diagnosis Date   Breast cancer (HCC) 2000   RT MASTECTOMY   GERD (gastroesophageal reflux disease)    Hyperlipidemia    Personal history of chemotherapy 2000   BREAST CA   Personal history of radiation therapy 2000   BREAST CA   Past Surgical History:  Procedure Laterality Date   BREAST SURGERY     mastectomy, left breast    COLONOSCOPY WITH PROPOFOL  N/A 02/10/2022   Procedure: COLONOSCOPY WITH PROPOFOL ;  Surgeon: Toledo, Ladell POUR, MD;  Location: [facility] ENDOSCOPY;  Service: Gastroenterology;  Laterality: N/A;   MASTECTOMY Right 2000   BREAST CA   ROBOTIC ASSISTED TOTAL HYSTERECTOMY  06/16/2021   Patient Active Problem List   Diagnosis Date Noted   Ecchymosis 10/02/2023   Pelvic mass in female 05/11/2021   Ovarian cyst 04/29/2021   Constipation 11/21/2020   Basal cell carcinoma of chin 06/24/2020   S/P right mastectomy 06/23/2020   Allergic rhinitis 07/28/2017   Colon cancer screening 03/14/2017   History of syncope 01/16/2015   Medicare annual wellness visit, subsequent 12/24/2012   Osteopenia due to cancer therapy 10/22/2011   Personal history of breast cancer 04/13/2011   Screening for cervical cancer 04/13/2011   Hyperlipidemia    GERD (gastroesophageal reflux disease)     PCP: Marylynn Verneita CROME, MD  REFERRING PROVIDER: Marylynn Verneita CROME, MD  REFERRING DIAG: [phone] (ICD-10-CM) - Cervical spondylitis with  radiculitis   RATIONALE FOR EVALUATION AND TREATMENT: Rehabilitation  THERAPY DIAG: Radiculopathy, cervical region  Pain in right arm  Muscle weakness (generalized)  ONSET DATE: 02/16/24, began with R arm pain   FOLLOW-UP APPT SCHEDULED WITH REFERRING PROVIDER: No    SUBJECTIVE:                                                                                                                                                                                         Chief Complaint: Patient is an 83 year old female referred for upper  quarter pain, R upper arm pain with referring diagnosis of cervical spondylitis with radiculitis.   Pertinent History Patient is an 83 year old female referred for upper quarter pain; R upper arm pain with referring diagnosis of cervical spondylitis with radiculitis. Pt had prednisone  taper and this notably improved pain. Pt is active and likes to exercise. Patient has f/u with neurosurgery 04/11/24. Pt felt that working in her yard let to onset of symptoms. Pt reports continuing with resistance training exercises at the time, and she feels that this may have aggravated it too. Patient reports difficulty with getting adequate sleep. Patient reports sleeping 3-4 hours, and then she's up and down through the remainder of the night. Pt is s/p R mastectomy 26 years ago. Pt reports pain in R upper arm. She reports pain has been worse in afternoon/evening generally.   Pain:  Pain Intensity: Present: 5/10, Best: 0/10, Worst: 8-10/10 Pain location: Upper arm/middle deltoid region Pain Quality: burning  Radiating: Yes ; R upper arm  Numbness/Tingling: Yes; affecting no specific digits, tingling into thenar region notably  Focal Weakness: Yes, harder to manipulate items with R hand Aggravating factors: pushing, pulling, knitting for prolonged period, pushing down for sit to stand Relieving factors: when walking/on the move,  24-hour pain behavior: worse at night  time History of prior neck injury, pain, surgery, or therapy: No  Dominant hand: right Imaging: Yes ;  CLINICAL DATA:  Neck pain   EXAM: DG CERVICAL SPINE COMPLETE 4+V   COMPARISON:  None Available.   FINDINGS: Frontal, bilateral oblique, lateral views of the cervical spine are obtained. Mild left convex cervical scoliosis. Otherwise alignment is anatomic. No acute displaced fracture. Mild multilevel spondylosis and facet hypertrophy, with disc space narrowing most pronounced at the C5-6 and C6-7 levels. Evaluation of the neural foramina is limited by suboptimal positioning. Prevertebral soft tissues are unremarkable. Lung apices are clear.   IMPRESSION: 1. Mild multilevel cervical spondylosis and facet hypertrophy. 2. Mild left convex cervical scoliosis. 3. No acute displaced fracture.     Electronically Signed   By: Ozell Daring M.D.   On: 02/22/2024 21:17   Red flags (personal history of cancer, h/o spinal tumors, history of compression fracture, chills/fever, night sweats, nausea, vomiting, unrelenting pain): Positive for Hx of breast cancer   PRECAUTIONS: None  WEIGHT BEARING RESTRICTIONS: No  FALLS: Has patient fallen in last 6 months? No  Living Environment Lives with: lives with their spouse Lives in: House/apartment  Prior level of function: Independent  Occupational demands: Retired  Presenter, Broadcasting: Archivist, working in yard  Patient Goals: Sleep better, not hurt as much     OBJECTIVE:   Patient Surveys  NDI:  NECK DISABILITY INDEX  Date: 03/21/24 Score  Pain intensity 2 = The pain is moderate at the moment  2. Personal care (washing, dressing, etc.) 1 =  I can look after myself normally but it causes extra pain  3. Lifting 2 = Pain prevents me lifting heavy weights off the floor, but I can manage if they are  conveniently placed, for example on a table  4. Reading 1 = I can read as much as I want to with slight pain in my neck  5. Headaches 0 = I  have no headaches at all  6. Concentration 1 =  I can concentrate fully when I want to with slight difficulty   7. Work 3 =  I cannot do my usual work  8. Driving 1 =  I can drive my  car as long as I want with slight pain in my neck  9. Sleeping 4 = My sleep is greatly disturbed (3-5 hrs sleepless)   10. Recreation 2 = I am able to engage in most, but not all of my usual recreation activities because of   pain in my neck  Total 17/50     Cognition Patient is oriented to person, place, and time.  Recent memory is intact.  Remote memory is intact.  Attention span and concentration are intact.  Expressive speech is intact.  Patient's fund of knowledge is within normal limits for educational level.    Gross Musculoskeletal Assessment Tremor: None Bulk: Normal Tone: Normal   Posture Mild forward head, rounded shoulders  AROM Shoulder AROM  Flexion: R 83*, L 139  Abduction: R 45*, L 135  Functional ER: R T3, L T3  Functional IR:  R L2*, L T8   AROM (Normal range in degrees) AROM 03/21/24  Cervical  Flexion (50) WNL  Extension (80) 37  Right lateral flexion (45) 38  Left lateral flexion (45) 34  Right rotation (85) 70  Left rotation (85) 67  (* = pain; Blank rows = not tested)   MMT MMT (out of 5) Right 03/21/24 Left 03/21/24      Shoulder   Flexion 4-* 4  Extension    Abduction 3+* 4-  Internal rotation    Horizontal abduction    Horizontal adduction    Lower Trapezius    Rhomboids        Elbow  Flexion 4+* 5  Extension 4+ 4+  Pronation    Supination        Wrist  Flexion 4+ 5  Extension 4* (upper arm) 5  Radial deviation    Ulnar deviation    (* = pain; Blank rows = not tested)  Sensation Grossly intact to light touch bilateral UE as determined by testing dermatomes C2-T2. Proprioception and hot/cold testing deferred on this date.  Reflexes R/L Elbow: Unable to obtain Brachioradialis: Unable to obtain/1+  Tricep:  1+/1+  Palpation Location LEFT  RIGHT           Suboccipitals  0  Cervical paraspinals 0 0  Upper Trapezius 0 0  Levator Scapulae 0 1  Rhomboid Major/Minor  1  (Blank rows = not tested) Graded on 0-4 scale (0 = no pain, 1 = pain, 2 = pain with wincing/grimacing/flinching, 3 = pain with withdrawal, 4 = unwilling to allow palpation), (Blank rows = not tested)  Repeated Movements Repeated cervical retraction: no effect during, no effect after; Repeated cervical retraction-extension: no effect during, slight decrease after  Passive Accessory Intervertebral Motion Deferred   SPECIAL TESTS Spurlings A (ipsilateral lateral flexion/axial compression): R: Positive for referred R upper quarter/arm pain L: Negative Distraction Test: Positive for pain relief  Hoffman Sign (cervical cord compression): R: Negative L: Negative ULTT Median: R: Not examined L: Not examined ULTT Ulnar: R: Not examined L: Not examined ULTT Radial: R: Not examined L: Not examined    TODAY'S TREATMENT    03/21/24   Therapeutic Exercise - for HEP establishment, discussion on appropriate exercise/activity modification, PT education   Reviewed baseline home exercise and provided handout for [facility] program (see Access Code); tactile cueing and therapist demonstration utilized as needed for carryover of proper technique to HEP.    Patient education on current condition, anatomy involved, prognosis, plan of care. Discussion on activity modification to prevent flare-up of condition, including avoidance of prolonged/repetitive cervical  flexion or protraction, postural correction.     PATIENT EDUCATION:  Education details: see above for patient education details Person educated: Patient Education method: Explanation, Demonstration, and Handouts Education comprehension: verbalized understanding and returned demonstration   HOME EXERCISE PROGRAM:  Access Code: [email or web address] URL:  [email or web address] Date: 03/21/2024 Prepared by: Venetia Endo  Exercises - Seated Cervical Retraction and Extension  - 5-6 x daily - 7 x weekly - 1 sets - 10 reps - 1sec hold   ASSESSMENT:  CLINICAL IMPRESSION: Patient is an 83 y.o. female who was seen today for physical therapy evaluation and treatment for R arm pain with suspected cervical radiculopathy. Patient's current clinical presentation is consistent with referring diagnosis of cervical radicular pain, and symptoms are modestly mitigated with use of traction and repeated extension. Pt has current impairments in R upper quarter referred pain with associated R upper limb paresthesias, decreased RUE strength, mild decrease in cervical extension and L>R rotation AROM, postural changes, and impaired RUE functional use. Pt has associated activity limitations with pushing, pulling, lifting, and using upper limb assist for transfers. This limits pt's ability to complete knitting and yard work. HEP was initiated with focus on primary repeated movement. We will further check neurodynamics next visit. Pt will continue to benefit from skilled PT services to address deficits and improve function.   OBJECTIVE IMPAIRMENTS: decreased ROM, decreased strength, hypomobility, impaired UE functional use, postural dysfunction, and pain.   ACTIVITY LIMITATIONS: carrying, lifting, sleeping, reach over head, and hygiene/grooming  PARTICIPATION LIMITATIONS: meal prep, cleaning, laundry, driving, shopping, and yard work  PERSONAL FACTORS: Age, Past/current experiences, and 3+ comorbidities: (Hx of breast cancer/R mastectomy, HLD, osteopenia) are also affecting patient's functional outcome.   REHAB POTENTIAL: Good  CLINICAL DECISION MAKING: Evolving/moderate complexity  EVALUATION COMPLEXITY: Moderate   GOALS: Goals reviewed with patient? Yes  SHORT TERM GOALS: Target date: 04/13/2024  Pt will be independent with HEP to improve  strength and decrease neck pain to improve pain-free function at home and work. Baseline: 03/21/24: Baseline repeated movement program initiated.  Goal status: INITIAL   LONG TERM GOALS: Target date: 05/02/2024  Pt will have access to full cervical spine AROM without reproduction of pain as needed for scanning environment, driving, and overhead activity Baseline: 03/21/24: Decreased C-spine extension and L>R rotation AROM.  Goal status: INITIAL  2.  Pt will decrease worst upper quarter pain by at least 3 points on the NPRS in order to demonstrate clinically significant reduction in neck pain. Baseline: 03/21/24: 8-10/10 at worst.  Goal status: INITIAL  3.  Pt will decrease NDI score by at least 19% in order demonstrate clinically significant reduction in neck pain/disability.       Baseline: 03/21/24: 17/50  Goal status: INITIAL  4.  Patient will have MMT 4+/5 or greater for deltoid and biceps without reproduction of symptoms indicative of improved strength and tolerance to load required for daily lifting and carrying tasks.  Baseline: 03/21/24: R shoulder flexion 4-, shoulder abduction 3+, biceps 4+ with pain reproduction.  Goal status: INITIAL   PLAN: PT FREQUENCY: 1-2x/week  PT DURATION: 6 weeks  PLANNED INTERVENTIONS: Therapeutic exercises, Therapeutic activity, Neuromuscular re-education, Balance training, Gait training, Patient/Family education, Self Care, Joint mobilization, Joint manipulation, Vestibular training, Canalith repositioning, Orthotic/Fit training, DME instructions, Dry Needling, Electrical stimulation, Spinal manipulation, Spinal mobilization, Cryotherapy, Moist heat, Taping, Traction, Ultrasound, Ionotophoresis 4mg /ml Dexamethasone , Manual therapy, and Re-evaluation.  PLAN FOR NEXT SESSION: Assess neurodynamics. Utilize traction prn for symptom modulation. Check response with repeated movement  program and modify movement as needed. Continue with periscapular  isometrics with progression to isotonics.    Venetia Endo, PT, DPT #[id number]  Venetia ONEIDA Endo, PT 03/21/2024, 2:10 PM

## 2024-03-26 ENCOUNTER — Ambulatory Visit: Admitting: Physical Therapy

## 2024-03-26 DIAGNOSIS — M6281 Muscle weakness (generalized): Secondary | ICD-10-CM | POA: Diagnosis not present

## 2024-03-26 DIAGNOSIS — M5412 Radiculopathy, cervical region: Secondary | ICD-10-CM | POA: Diagnosis not present

## 2024-03-26 DIAGNOSIS — M79601 Pain in right arm: Secondary | ICD-10-CM

## 2024-03-26 DIAGNOSIS — M4692 Unspecified inflammatory spondylopathy, cervical region: Secondary | ICD-10-CM | POA: Diagnosis not present

## 2024-03-26 NOTE — Therapy (Unsigned)
 OUTPATIENT PHYSICAL THERAPY TREATMENT   Patient Name: Lori Caldwell MRN: [id number] DOB:09-Apr-1941, 83 y.o., female Today's Date: 03/26/2024  END OF SESSION:  PT End of Session - 03/26/24 1414     Visit Number 2    Number of Visits 13    Date for Recertification  05/02/24    Authorization Type Medicare A&B 2025    PT Start Time 1416    PT Stop Time 1501    PT Time Calculation (min) 45 min    Activity Tolerance Patient limited by pain    Behavior During Therapy [facility] for tasks assessed/performed           Past Medical History:  Diagnosis Date   Breast cancer (HCC) 2000   RT MASTECTOMY   GERD (gastroesophageal reflux disease)    Hyperlipidemia    Personal history of chemotherapy 2000   BREAST CA   Personal history of radiation therapy 2000   BREAST CA   Past Surgical History:  Procedure Laterality Date   BREAST SURGERY     mastectomy, left breast    COLONOSCOPY WITH PROPOFOL  N/A 02/10/2022   Procedure: COLONOSCOPY WITH PROPOFOL ;  Surgeon: Toledo, Ladell POUR, MD;  Location: [facility] ENDOSCOPY;  Service: Gastroenterology;  Laterality: N/A;   MASTECTOMY Right 2000   BREAST CA   ROBOTIC ASSISTED TOTAL HYSTERECTOMY  06/16/2021   Patient Active Problem List   Diagnosis Date Noted   Ecchymosis 10/02/2023   Pelvic mass in female 05/11/2021   Ovarian cyst 04/29/2021   Constipation 11/21/2020   Basal cell carcinoma of chin 06/24/2020   S/P right mastectomy 06/23/2020   Allergic rhinitis 07/28/2017   Colon cancer screening 03/14/2017   History of syncope 01/16/2015   Medicare annual wellness visit, subsequent 12/24/2012   Osteopenia due to cancer therapy 10/22/2011   Personal history of breast cancer 04/13/2011   Screening for cervical cancer 04/13/2011   Hyperlipidemia    GERD (gastroesophageal reflux disease)     PCP: Marylynn Verneita CROME, MD  REFERRING PROVIDER: Marylynn Verneita CROME, MD  REFERRING DIAG: [phone] (ICD-10-CM) - Cervical spondylitis with radiculitis    RATIONALE FOR EVALUATION AND TREATMENT: Rehabilitation  THERAPY DIAG: Radiculopathy, cervical region  Pain in right arm  Muscle weakness (generalized)  ONSET DATE: 02/16/24, began with R arm pain   FOLLOW-UP APPT SCHEDULED WITH REFERRING PROVIDER: No   PERTINENT HISTORY: Patient is an 83 year old female referred for upper quarter pain; R upper arm pain with referring diagnosis of cervical spondylitis with radiculitis. Pt had prednisone  taper and this notably improved pain. Pt is active and likes to exercise. Patient has f/u with neurosurgery 04/11/24. Pt felt that working in her yard let to onset of symptoms. Pt reports continuing with resistance training exercises at the time, and she feels that this may have aggravated it too. Patient reports difficulty with getting adequate sleep. Patient reports sleeping 3-4 hours, and then she's up and down through the remainder of the night. Pt is s/p R mastectomy 26 years ago. Pt reports pain in R upper arm. She reports pain has been worse in afternoon/evening generally.   Pain:  Pain Intensity: Present: 5/10, Best: 0/10, Worst: 8-10/10 Pain location: Upper arm/middle deltoid region Pain Quality: burning  Radiating: Yes ; R upper arm  Numbness/Tingling: Yes; affecting no specific digits, tingling into thenar region notably  Focal Weakness: Yes, harder to manipulate items with R hand Aggravating factors: pushing, pulling, knitting for prolonged period, pushing down for sit to stand Relieving factors: when walking/on the  move,  24-hour pain behavior: worse at night time History of prior neck injury, pain, surgery, or therapy: No  Dominant hand: right Imaging: Yes ;  CLINICAL DATA:  Neck pain   EXAM: DG CERVICAL SPINE COMPLETE 4+V   COMPARISON:  None Available.   FINDINGS: Frontal, bilateral oblique, lateral views of the cervical spine are obtained. Mild left convex cervical scoliosis. Otherwise alignment is anatomic. No acute displaced  fracture. Mild multilevel spondylosis and facet hypertrophy, with disc space narrowing most pronounced at the C5-6 and C6-7 levels. Evaluation of the neural foramina is limited by suboptimal positioning. Prevertebral soft tissues are unremarkable. Lung apices are clear.   IMPRESSION: 1. Mild multilevel cervical spondylosis and facet hypertrophy. 2. Mild left convex cervical scoliosis. 3. No acute displaced fracture.     Electronically Signed   By: Ozell Daring M.D.   On: 02/22/2024 21:17   Red flags (personal history of cancer, h/o spinal tumors, history of compression fracture, chills/fever, night sweats, nausea, vomiting, unrelenting pain): Positive for Hx of breast cancer   PRECAUTIONS: None  WEIGHT BEARING RESTRICTIONS: No  FALLS: Has patient fallen in last 6 months? No  Living Environment Lives with: lives with their spouse Lives in: House/apartment  Prior level of function: Independent  Occupational demands: Retired  Presenter, Broadcasting: Archivist, working in yard  Patient Goals: Sleep better, not hurt as much    OBJECTIVE (data from initial evaluation unless otherwise dated):   Patient Surveys  NDI:  NECK DISABILITY INDEX  Date: 03/21/24 Score  Pain intensity 2 = The pain is moderate at the moment  2. Personal care (washing, dressing, etc.) 1 =  I can look after myself normally but it causes extra pain  3. Lifting 2 = Pain prevents me lifting heavy weights off the floor, but I can manage if they are  conveniently placed, for example on a table  4. Reading 1 = I can read as much as I want to with slight pain in my neck  5. Headaches 0 = I have no headaches at all  6. Concentration 1 =  I can concentrate fully when I want to with slight difficulty   7. Work 3 =  I cannot do my usual work  8. Driving 1 =  I can drive my car as long as I want with slight pain in my neck  9. Sleeping 4 = My sleep is greatly disturbed (3-5 hrs sleepless)   10. Recreation 2 = I am able to  engage in most, but not all of my usual recreation activities because of   pain in my neck  Total 17/50    Posture Mild forward head, rounded shoulders  AROM Shoulder AROM  Flexion: R 83*, L 139  Abduction: R 45*, L 135  Functional ER: R T3, L T3  Functional IR:  R L2*, L T8   AROM (Normal range in degrees) AROM 03/21/24  Cervical  Flexion (50) WNL  Extension (80) 37  Right lateral flexion (45) 38  Left lateral flexion (45) 34  Right rotation (85) 70  Left rotation (85) 67  (* = pain; Blank rows = not tested)   MMT MMT (out of 5) Right 03/21/24 Left 03/21/24      Shoulder   Flexion 4-* 4  Extension    Abduction 3+* 4-  Internal rotation    Horizontal abduction    Horizontal adduction    Lower Trapezius    Rhomboids  Elbow  Flexion 4+* 5  Extension 4+ 4+  Pronation    Supination        Wrist  Flexion 4+ 5  Extension 4* (upper arm) 5  Radial deviation    Ulnar deviation    (* = pain; Blank rows = not tested)  Sensation Grossly intact to light touch bilateral UE as determined by testing dermatomes C2-T2. Proprioception and hot/cold testing deferred on this date.  Reflexes R/L Elbow: Unable to obtain Brachioradialis: Unable to obtain/1+  Tricep: 1+/1+  Palpation Location LEFT  RIGHT           Suboccipitals  0  Cervical paraspinals 0 0  Upper Trapezius 0 0  Levator Scapulae 0 1  Rhomboid Major/Minor  1  (Blank rows = not tested) Graded on 0-4 scale (0 = no pain, 1 = pain, 2 = pain with wincing/grimacing/flinching, 3 = pain with withdrawal, 4 = unwilling to allow palpation), (Blank rows = not tested)  Repeated Movements Repeated cervical retraction: no effect during, no effect after; Repeated cervical retraction-extension: no effect during, slight decrease after  Passive Accessory Intervertebral Motion No pain with CPA or UPA. Hypomobile C4-7 CPA. Decreased R to L sideglide at C4-7.   SPECIAL TESTS Spurlings A (ipsilateral lateral  flexion/axial compression): R: Positive for referred R upper quarter/arm pain L: Negative Distraction Test: Positive for pain relief  Hoffman Sign (cervical cord compression): R: Negative L: Negative ULTT Median: R: Positive L: Negative ULTT Ulnar: R: Negative L: Negative ULTT Radial: R: Negative L: Negative    TODAY'S TREATMENT    03/26/2024   SUBJECTIVE STATEMENT:   Patient reports tolerating her initial home exercise well. She reports some difficulty with positioning for cervical roll (using rolled towel). Patient reports her pain is about the same. She states her symptoms ease with less activity. Patient reports 6-7/10 pain at arrival to PT.    Therapeutic Exercise - for improved soft tissue flexibility and extensibility as needed for ROM, improved strength as needed to improve performance of CKC activities/functional movements  Repeated retraction-extension; 2 x 10, 1 sec - remaining pain in upper arm, no pain in hand/distal upper limb  PATIENT EDUCATION: HEP update and review; provided new Med[employer] handout.    Manual Therapy - for symptom modulation, soft tissue sensitivity and mobility, joint mobility, ROM   Update neurodynamics testing  Update passive accessory testing  Manual cervical traction in supine; 10 sec on, 10 sec off; x 10 minutes for nerve root decompression and symptom modulation STM/DTM   Neuromuscular Re-education - for postural re-education, neural gliding/nerve mobilization for decreased sensitivity and improved neural/dural tissue mobility   Seated scapular retraction; 2 x 10, 3 sec hold   -verbal and tactile cueing for attaining retraction/depression   Median nerve glide; 1 x 10 in sitting   -notable pain with holding arm  Median nerve glide, with arm propped on arm rest; 1 x 10, 1 sec hold   -heavy cueing for arm and hand positioning, PT demonstration     PATIENT EDUCATION:  Education details: see above for patient education details Person  educated: Patient Education method: Explanation, Demonstration, and Handouts Education comprehension: verbalized understanding and returned demonstration   HOME EXERCISE PROGRAM:  Access Code: [email or web address] URL: [email or web address] Date: 03/26/2024 Prepared by: Venetia Endo  Exercises - Seated Cervical Retraction and Extension  - 5-6 x daily - 7 x weekly - 1 sets - 10 reps - 1sec hold - Median Nerve Flossing - Tray (Mirrored)  - 2-3 x  daily - 7 x weekly - 2 sets - 10 reps - 1sec hold - Seated Scapular Retraction  - 2-3 x daily - 7 x weekly - 2 sets - 10 reps - 3sec hold   ASSESSMENT:  CLINICAL IMPRESSION: Patient has notable R upper limb pain with median nerve tension testing. Her peripheral symptoms appear to be less apparent with use of repeated movement (extension as primary movement at this time); AROM is also full and pain-free today following repeated extension. We will proceed with extension as provisional primary movement. Her program was updated to include periscapular isometrics and nerve flossing. Pt will continue to benefit from skilled PT services to address deficits and improve function.   OBJECTIVE IMPAIRMENTS: decreased ROM, decreased strength, hypomobility, impaired UE functional use, postural dysfunction, and pain.   ACTIVITY LIMITATIONS: carrying, lifting, sleeping, reach over head, and hygiene/grooming  PARTICIPATION LIMITATIONS: meal prep, cleaning, laundry, driving, shopping, and yard work  PERSONAL FACTORS: Age, Past/current experiences, and 3+ comorbidities: (Hx of breast cancer/R mastectomy, HLD, osteopenia) are also affecting patient's functional outcome.   REHAB POTENTIAL: Good  CLINICAL DECISION MAKING: Evolving/moderate complexity  EVALUATION COMPLEXITY: Moderate   GOALS: Goals reviewed with patient? Yes  SHORT TERM GOALS: Target date: 04/13/2024  Pt will be independent with HEP to improve strength and decrease neck pain to improve  pain-free function at home and work. Baseline: 03/21/24: Baseline repeated movement program initiated.  Goal status: INITIAL   LONG TERM GOALS: Target date: 05/02/2024  Pt will have access to full cervical spine AROM without reproduction of pain as needed for scanning environment, driving, and overhead activity Baseline: 03/21/24: Decreased C-spine extension and L>R rotation AROM.  Goal status: INITIAL  2.  Pt will decrease worst upper quarter pain by at least 3 points on the NPRS in order to demonstrate clinically significant reduction in neck pain. Baseline: 03/21/24: 8-10/10 at worst.  Goal status: INITIAL  3.  Pt will decrease NDI score by at least 19% in order demonstrate clinically significant reduction in neck pain/disability.       Baseline: 03/21/24: 17/50  Goal status: INITIAL  4.  Patient will have MMT 4+/5 or greater for deltoid and biceps without reproduction of symptoms indicative of improved strength and tolerance to load required for daily lifting and carrying tasks.  Baseline: 03/21/24: R shoulder flexion 4-, shoulder abduction 3+, biceps 4+ with pain reproduction.  Goal status: INITIAL   PLAN: PT FREQUENCY: 1-2x/week  PT DURATION: 6 weeks  PLANNED INTERVENTIONS: Therapeutic exercises, Therapeutic activity, Neuromuscular re-education, Balance training, Gait training, Patient/Family education, Self Care, Joint mobilization, Joint manipulation, Vestibular training, Canalith repositioning, Orthotic/Fit training, DME instructions, Dry Needling, Electrical stimulation, Spinal manipulation, Spinal mobilization, Cryotherapy, Moist heat, Taping, Traction, Ultrasound, Ionotophoresis 4mg /ml Dexamethasone , Manual therapy, and Re-evaluation.  PLAN FOR NEXT SESSION: Utilize traction prn for symptom modulation. Re-assess response with repeated movement program and modify movement as needed. Continue with periscapular isometrics with progression to isotonics.  Neurodynamics.   Venetia Endo, PT, DPT #[id number]  Venetia ONEIDA Endo, PT 03/26/2024, 3:01 PM

## 2024-03-28 ENCOUNTER — Encounter: Payer: Self-pay | Admitting: Physical Therapy

## 2024-04-03 ENCOUNTER — Ambulatory Visit: Attending: Internal Medicine | Admitting: Physical Therapy

## 2024-04-03 DIAGNOSIS — M79601 Pain in right arm: Secondary | ICD-10-CM | POA: Diagnosis present

## 2024-04-03 DIAGNOSIS — M5412 Radiculopathy, cervical region: Secondary | ICD-10-CM | POA: Insufficient documentation

## 2024-04-03 DIAGNOSIS — M6281 Muscle weakness (generalized): Secondary | ICD-10-CM | POA: Diagnosis present

## 2024-04-03 NOTE — Therapy (Signed)
 OUTPATIENT PHYSICAL THERAPY TREATMENT   Patient Name: Lori Caldwell MRN: [id number] DOB:October 09, 1940, 83 y.o., female Today's Date: 04/03/2024  END OF SESSION:  PT End of Session - 04/03/24 0945     Visit Number 3    Number of Visits 13    Date for Recertification  05/02/24    Authorization Type Medicare A&B 2025    PT Start Time 0947    PT Stop Time 1031    PT Time Calculation (min) 44 min    Activity Tolerance Patient limited by pain    Behavior During Therapy [facility] for tasks assessed/performed          Past Medical History:  Diagnosis Date   Breast cancer (HCC) 2000   RT MASTECTOMY   GERD (gastroesophageal reflux disease)    Hyperlipidemia    Personal history of chemotherapy 2000   BREAST CA   Personal history of radiation therapy 2000   BREAST CA   Past Surgical History:  Procedure Laterality Date   BREAST SURGERY     mastectomy, left breast    COLONOSCOPY WITH PROPOFOL  N/A 02/10/2022   Procedure: COLONOSCOPY WITH PROPOFOL ;  Surgeon: Toledo, Ladell POUR, MD;  Location: [facility] ENDOSCOPY;  Service: Gastroenterology;  Laterality: N/A;   MASTECTOMY Right 2000   BREAST CA   ROBOTIC ASSISTED TOTAL HYSTERECTOMY  06/16/2021   Patient Active Problem List   Diagnosis Date Noted   Ecchymosis 10/02/2023   Pelvic mass in female 05/11/2021   Ovarian cyst 04/29/2021   Constipation 11/21/2020   Basal cell carcinoma of chin 06/24/2020   S/P right mastectomy 06/23/2020   Allergic rhinitis 07/28/2017   Colon cancer screening 03/14/2017   History of syncope 01/16/2015   Medicare annual wellness visit, subsequent 12/24/2012   Osteopenia due to cancer therapy 10/22/2011   Personal history of breast cancer 04/13/2011   Screening for cervical cancer 04/13/2011   Hyperlipidemia    GERD (gastroesophageal reflux disease)     PCP: Marylynn Verneita CROME, MD  REFERRING PROVIDER: Marylynn Verneita CROME, MD  REFERRING DIAG: [phone] (ICD-10-CM) - Cervical spondylitis with radiculitis    RATIONALE FOR EVALUATION AND TREATMENT: Rehabilitation  THERAPY DIAG: Radiculopathy, cervical region  Pain in right arm  Muscle weakness (generalized)  ONSET DATE: 02/16/24, began with R arm pain   FOLLOW-UP APPT SCHEDULED WITH REFERRING PROVIDER: No   PERTINENT HISTORY: Patient is an 83 year old female referred for upper quarter pain; R upper arm pain with referring diagnosis of cervical spondylitis with radiculitis. Pt had prednisone  taper and this notably improved pain. Pt is active and likes to exercise. Patient has f/u with neurosurgery 04/11/24. Pt felt that working in her yard let to onset of symptoms. Pt reports continuing with resistance training exercises at the time, and she feels that this may have aggravated it too. Patient reports difficulty with getting adequate sleep. Patient reports sleeping 3-4 hours, and then she's up and down through the remainder of the night. Pt is s/p R mastectomy 26 years ago. Pt reports pain in R upper arm. She reports pain has been worse in afternoon/evening generally.   Pain:  Pain Intensity: Present: 5/10, Best: 0/10, Worst: 8-10/10 Pain location: Upper arm/middle deltoid region Pain Quality: burning  Radiating: Yes ; R upper arm  Numbness/Tingling: Yes; affecting no specific digits, tingling into thenar region notably  Focal Weakness: Yes, harder to manipulate items with R hand Aggravating factors: pushing, pulling, knitting for prolonged period, pushing down for sit to stand Relieving factors: when walking/on the move,  24-hour pain behavior: worse at night time History of prior neck injury, pain, surgery, or therapy: No  Dominant hand: right Imaging: Yes ;  CLINICAL DATA:  Neck pain   EXAM: DG CERVICAL SPINE COMPLETE 4+V   COMPARISON:  None Available.   FINDINGS: Frontal, bilateral oblique, lateral views of the cervical spine are obtained. Mild left convex cervical scoliosis. Otherwise alignment is anatomic. No acute displaced  fracture. Mild multilevel spondylosis and facet hypertrophy, with disc space narrowing most pronounced at the C5-6 and C6-7 levels. Evaluation of the neural foramina is limited by suboptimal positioning. Prevertebral soft tissues are unremarkable. Lung apices are clear.   IMPRESSION: 1. Mild multilevel cervical spondylosis and facet hypertrophy. 2. Mild left convex cervical scoliosis. 3. No acute displaced fracture.     Electronically Signed   By: Ozell Daring M.D.   On: 02/22/2024 21:17   Red flags (personal history of cancer, h/o spinal tumors, history of compression fracture, chills/fever, night sweats, nausea, vomiting, unrelenting pain): Positive for Hx of breast cancer   PRECAUTIONS: None  WEIGHT BEARING RESTRICTIONS: No  FALLS: Has patient fallen in last 6 months? No  Living Environment Lives with: lives with their spouse Lives in: House/apartment  Prior level of function: Independent  Occupational demands: Retired  Presenter, Broadcasting: Archivist, working in yard  Patient Goals: Sleep better, not hurt as much    OBJECTIVE (data from initial evaluation unless otherwise dated):   Patient Surveys  NDI:  NECK DISABILITY INDEX  Date: 03/21/24 Score  Pain intensity 2 = The pain is moderate at the moment  2. Personal care (washing, dressing, etc.) 1 =  I can look after myself normally but it causes extra pain  3. Lifting 2 = Pain prevents me lifting heavy weights off the floor, but I can manage if they are  conveniently placed, for example on a table  4. Reading 1 = I can read as much as I want to with slight pain in my neck  5. Headaches 0 = I have no headaches at all  6. Concentration 1 =  I can concentrate fully when I want to with slight difficulty   7. Work 3 =  I cannot do my usual work  8. Driving 1 =  I can drive my car as long as I want with slight pain in my neck  9. Sleeping 4 = My sleep is greatly disturbed (3-5 hrs sleepless)   10. Recreation 2 = I am able to  engage in most, but not all of my usual recreation activities because of   pain in my neck  Total 17/50    Posture Mild forward head, rounded shoulders  AROM Shoulder AROM  Flexion: R 83*, L 139  Abduction: R 45*, L 135  Functional ER: R T3, L T3  Functional IR:  R L2*, L T8   AROM (Normal range in degrees) AROM 03/21/24  Cervical  Flexion (50) WNL  Extension (80) 37  Right lateral flexion (45) 38  Left lateral flexion (45) 34  Right rotation (85) 70  Left rotation (85) 67  (* = pain; Blank rows = not tested)   MMT MMT (out of 5) Right 03/21/24 Left 03/21/24      Shoulder   Flexion 4-* 4  Extension    Abduction 3+* 4-  Internal rotation    Horizontal abduction    Horizontal adduction    Lower Trapezius    Rhomboids        Elbow  Flexion 4+* 5  Extension 4+ 4+  Pronation    Supination        Wrist  Flexion 4+ 5  Extension 4* (upper arm) 5  Radial deviation    Ulnar deviation    (* = pain; Blank rows = not tested)  Sensation Grossly intact to light touch bilateral UE as determined by testing dermatomes C2-T2. Proprioception and hot/cold testing deferred on this date.  Reflexes R/L Elbow: Unable to obtain Brachioradialis: Unable to obtain/1+  Tricep: 1+/1+  Palpation Location LEFT  RIGHT           Suboccipitals  0  Cervical paraspinals 0 0  Upper Trapezius 0 0  Levator Scapulae 0 1  Rhomboid Major/Minor  1  (Blank rows = not tested) Graded on 0-4 scale (0 = no pain, 1 = pain, 2 = pain with wincing/grimacing/flinching, 3 = pain with withdrawal, 4 = unwilling to allow palpation), (Blank rows = not tested)  Repeated Movements Repeated cervical retraction: no effect during, no effect after; Repeated cervical retraction-extension: no effect during, slight decrease after  Passive Accessory Intervertebral Motion No pain with CPA or UPA. Hypomobile C4-7 CPA. Decreased R to L sideglide at C4-7.   SPECIAL TESTS Spurlings A (ipsilateral lateral  flexion/axial compression): R: Positive for referred R upper quarter/arm pain L: Negative Distraction Test: Positive for pain relief  Hoffman Sign (cervical cord compression): R: Negative L: Negative ULTT Median: R: Positive L: Negative ULTT Ulnar: R: Negative L: Negative ULTT Radial: R: Negative L: Negative    TODAY'S TREATMENT    04/03/2024   SUBJECTIVE STATEMENT:   Patient reports improved quality of sleep the last few nights with less disruption. Patient reports pain with completion of median nerve glide with arm in unsupported position. Patient reports same pain affecting her arm at arrival. Patient reports not having as much pain down to her hand. Pt reports more pain closer to upper arm and R paracervical region. Patient reports compliance with her HEP.    Therapeutic Exercise - for improved soft tissue flexibility and extensibility as needed for ROM, improved strength as needed to improve performance of CKC activities/functional movements  Repeated retraction-extension; 1 x 10, 1 sec - mild sensation in neck during, no change after Repeated retraction-extension, patient overpressure; 1 x 10,  1 sec   -remaining pain in upper arm, easing up after   Pt exhibits normal cervical spine AROM without reproduction of symptoms   PATIENT EDUCATION: HEP update and review; provided new [employer] handout.    Manual Therapy - for symptom modulation, soft tissue sensitivity and mobility, joint mobility, ROM    Manual cervical traction in supine; 10 sec on, 10 sec off; x 6 minutes for nerve root decompression and symptom modulation STM/DTM   Neuromuscular Re-education - for postural re-education, neural gliding/nerve mobilization for decreased sensitivity and improved neural/dural tissue mobility   Passive median nerve flossing; 2 x 10 with conjunct cervical lateral flexion  TPR R upper trapezius; x 3 minutes for symptom modulation/decreased referred pain  _____________    Median  nerve glide, with arm propped on arm rest; 1 x 10, 1 sec hold   -for HEP review to improve tolerance with nerve flossing   Standing row; 2 x 10, 3 sec hold with Green Tband   -tactile and verbal cueing/demo for scap depression   *not today* Seated scapular retraction; 2 x 10, 3 sec hold    PATIENT EDUCATION:  Education details: see above for patient education details Person  educated: Patient Education method: Explanation, Demonstration, and Handouts Education comprehension: verbalized understanding and returned demonstration   HOME EXERCISE PROGRAM:  Access Code: [email or web address] URL: [email or web address] Date: 04/03/2024 Prepared by: Venetia Endo  Exercises - Seated Cervical Retraction and Extension  - 5-6 x daily - 7 x weekly - 1 sets - 10 reps - 1sec hold - Median Nerve Flossing - Tray (Mirrored)  - 2-3 x daily - 7 x weekly - 2 sets - 10 reps - 1sec hold - Seated Scapular Retraction  - 2-3 x daily - 7 x weekly - 2 sets - 10 reps - 3sec hold - Standing Shoulder Row with Anchored Resistance  - 2 x daily - 7 x weekly - 2 sets - 10 reps - 3sec hold   ASSESSMENT:  CLINICAL IMPRESSION: Patient has ongoing symptoms largely affecting R upper arm. She has normal cervical spine AROM with re-testing today and decrease in peripheral symptoms/distal R upper limb paresthesias with MDT program to date. She has continuous R upper arm symptoms that remain constant with repeated movement and cervical traction. They are modestly decreased with use of soft tissue techniques/TPR. Pt's prognosis is favorable with apparent centralizing of symptoms, but R upper arm pain has been more difficult to reduce. Her extension program was updated today to include patient overpressure. Pt will continue to benefit from skilled PT services to address deficits and improve function.   OBJECTIVE IMPAIRMENTS: decreased ROM, decreased strength, hypomobility, impaired UE functional use, postural dysfunction,  and pain.   ACTIVITY LIMITATIONS: carrying, lifting, sleeping, reach over head, and hygiene/grooming  PARTICIPATION LIMITATIONS: meal prep, cleaning, laundry, driving, shopping, and yard work  PERSONAL FACTORS: Age, Past/current experiences, and 3+ comorbidities: (Hx of breast cancer/R mastectomy, HLD, osteopenia) are also affecting patient's functional outcome.   REHAB POTENTIAL: Good  CLINICAL DECISION MAKING: Evolving/moderate complexity  EVALUATION COMPLEXITY: Moderate   GOALS: Goals reviewed with patient? Yes  SHORT TERM GOALS: Target date: 04/13/2024  Pt will be independent with HEP to improve strength and decrease neck pain to improve pain-free function at home and work. Baseline: 03/21/24: Baseline repeated movement program initiated.  Goal status: INITIAL   LONG TERM GOALS: Target date: 05/02/2024  Pt will have access to full cervical spine AROM without reproduction of pain as needed for scanning environment, driving, and overhead activity Baseline: 03/21/24: Decreased C-spine extension and L>R rotation AROM.  Goal status: INITIAL  2.  Pt will decrease worst upper quarter pain by at least 3 points on the NPRS in order to demonstrate clinically significant reduction in neck pain. Baseline: 03/21/24: 8-10/10 at worst.  Goal status: INITIAL  3.  Pt will decrease NDI score by at least 19% in order demonstrate clinically significant reduction in neck pain/disability.       Baseline: 03/21/24: 17/50  Goal status: INITIAL  4.  Patient will have MMT 4+/5 or greater for deltoid and biceps without reproduction of symptoms indicative of improved strength and tolerance to load required for daily lifting and carrying tasks.  Baseline: 03/21/24: R shoulder flexion 4-, shoulder abduction 3+, biceps 4+ with pain reproduction.  Goal status: INITIAL   PLAN: PT FREQUENCY: 1-2x/week  PT DURATION: 6 weeks  PLANNED INTERVENTIONS: Therapeutic exercises, Therapeutic activity,  Neuromuscular re-education, Balance training, Gait training, Patient/Family education, Self Care, Joint mobilization, Joint manipulation, Vestibular training, Canalith repositioning, Orthotic/Fit training, DME instructions, Dry Needling, Electrical stimulation, Spinal manipulation, Spinal mobilization, Cryotherapy, Moist heat, Taping, Traction, Ultrasound, Ionotophoresis 4mg /ml Dexamethasone , Manual therapy, and Re-evaluation.  PLAN FOR NEXT SESSION:  Utilize traction prn for symptom modulation. Re-assess response with repeated movement program and modify movement as needed. Continue with periscapular isometrics/isotonics. Neurodynamics.   Venetia Endo, PT, DPT #[id number]  Venetia ONEIDA Endo, PT 04/03/2024, 11:03 AM

## 2024-04-05 ENCOUNTER — Ambulatory Visit: Admitting: Physical Therapy

## 2024-04-05 DIAGNOSIS — M5412 Radiculopathy, cervical region: Secondary | ICD-10-CM

## 2024-04-05 DIAGNOSIS — M79601 Pain in right arm: Secondary | ICD-10-CM

## 2024-04-05 DIAGNOSIS — M6281 Muscle weakness (generalized): Secondary | ICD-10-CM

## 2024-04-05 NOTE — Therapy (Signed)
 OUTPATIENT PHYSICAL THERAPY TREATMENT   Patient Name: Lori Caldwell MRN: [id number] DOB:[birth year], 83 y.o., female Today's Date: 04/05/2024  END OF SESSION:  PT End of Session - 04/05/24 1022     Visit Number 4    Number of Visits 13    Date for Recertification  05/02/24    Authorization Type Medicare A&B 2025    PT Start Time 0945    PT Stop Time 1024    PT Time Calculation (min) 39 min    Activity Tolerance Patient limited by pain    Behavior During Therapy [facility] for tasks assessed/performed           Past Medical History:  Diagnosis Date   Breast cancer (HCC) 2000   RT MASTECTOMY   GERD (gastroesophageal reflux disease)    Hyperlipidemia    Personal history of chemotherapy 2000   BREAST CA   Personal history of radiation therapy 2000   BREAST CA   Past Surgical History:  Procedure Laterality Date   BREAST SURGERY     mastectomy, left breast    COLONOSCOPY WITH PROPOFOL  N/A 02/10/2022   Procedure: COLONOSCOPY WITH PROPOFOL ;  Surgeon: Toledo, Ladell POUR, MD;  Location: [facility] ENDOSCOPY;  Service: Gastroenterology;  Laterality: N/A;   MASTECTOMY Right 2000   BREAST CA   ROBOTIC ASSISTED TOTAL HYSTERECTOMY  06/16/2021   Patient Active Problem List   Diagnosis Date Noted   Ecchymosis 10/02/2023   Pelvic mass in female 05/11/2021   Ovarian cyst 04/29/2021   Constipation 11/21/2020   Basal cell carcinoma of chin 06/24/2020   S/P right mastectomy 06/23/2020   Allergic rhinitis 07/28/2017   Colon cancer screening 03/14/2017   History of syncope 01/16/2015   Medicare annual wellness visit, subsequent 12/24/2012   Osteopenia due to cancer therapy 10/22/2011   Personal history of breast cancer 04/13/2011   Screening for cervical cancer 04/13/2011   Hyperlipidemia    GERD (gastroesophageal reflux disease)     PCP: Marylynn Verneita CROME, MD  REFERRING PROVIDER: Marylynn Verneita CROME, MD  REFERRING DIAG: [phone] (ICD-10-CM) - Cervical spondylitis with radiculitis    RATIONALE FOR EVALUATION AND TREATMENT: Rehabilitation  THERAPY DIAG: Radiculopathy, cervical region  Pain in right arm  Muscle weakness (generalized)  ONSET DATE: 02/16/24, began with R arm pain   FOLLOW-UP APPT SCHEDULED WITH REFERRING PROVIDER: No   PERTINENT HISTORY: Patient is an 83 year old female referred for upper quarter pain; R upper arm pain with referring diagnosis of cervical spondylitis with radiculitis. Pt had prednisone  taper and this notably improved pain. Pt is active and likes to exercise. Patient has f/u with neurosurgery 04/11/24. Pt felt that working in her yard let to onset of symptoms. Pt reports continuing with resistance training exercises at the time, and she feels that this may have aggravated it too. Patient reports difficulty with getting adequate sleep. Patient reports sleeping 3-4 hours, and then she's up and down through the remainder of the night. Pt is s/p R mastectomy 26 years ago. Pt reports pain in R upper arm. She reports pain has been worse in afternoon/evening generally.   Pain:  Pain Intensity: Present: 5/10, Best: 0/10, Worst: 8-10/10 Pain location: Upper arm/middle deltoid region Pain Quality: burning  Radiating: Yes ; R upper arm  Numbness/Tingling: Yes; affecting no specific digits, tingling into thenar region notably  Focal Weakness: Yes, harder to manipulate items with R hand Aggravating factors: pushing, pulling, knitting for prolonged period, pushing down for sit to stand Relieving factors: when walking/on the  move,  24-hour pain behavior: worse at night time History of prior neck injury, pain, surgery, or therapy: No  Dominant hand: right Imaging: Yes ;  CLINICAL DATA:  Neck pain   EXAM: DG CERVICAL SPINE COMPLETE 4+V   COMPARISON:  None Available.   FINDINGS: Frontal, bilateral oblique, lateral views of the cervical spine are obtained. Mild left convex cervical scoliosis. Otherwise alignment is anatomic. No acute displaced  fracture. Mild multilevel spondylosis and facet hypertrophy, with disc space narrowing most pronounced at the C5-6 and C6-7 levels. Evaluation of the neural foramina is limited by suboptimal positioning. Prevertebral soft tissues are unremarkable. Lung apices are clear.   IMPRESSION: 1. Mild multilevel cervical spondylosis and facet hypertrophy. 2. Mild left convex cervical scoliosis. 3. No acute displaced fracture.     Electronically Signed   By: Ozell Daring M.D.   On: 02/22/2024 21:17   Red flags (personal history of cancer, h/o spinal tumors, history of compression fracture, chills/fever, night sweats, nausea, vomiting, unrelenting pain): Positive for Hx of breast cancer   PRECAUTIONS: None  WEIGHT BEARING RESTRICTIONS: No  FALLS: Has patient fallen in last 6 months? No  Living Environment Lives with: lives with their spouse Lives in: House/apartment  Prior level of function: Independent  Occupational demands: Retired  Presenter, Broadcasting: Archivist, working in yard  Patient Goals: Sleep better, not hurt as much    OBJECTIVE (data from initial evaluation unless otherwise dated):   Patient Surveys  NDI:  NECK DISABILITY INDEX  Date: 03/21/24 Score  Pain intensity 2 = The pain is moderate at the moment  2. Personal care (washing, dressing, etc.) 1 =  I can look after myself normally but it causes extra pain  3. Lifting 2 = Pain prevents me lifting heavy weights off the floor, but I can manage if they are  conveniently placed, for example on a table  4. Reading 1 = I can read as much as I want to with slight pain in my neck  5. Headaches 0 = I have no headaches at all  6. Concentration 1 =  I can concentrate fully when I want to with slight difficulty   7. Work 3 =  I cannot do my usual work  8. Driving 1 =  I can drive my car as long as I want with slight pain in my neck  9. Sleeping 4 = My sleep is greatly disturbed (3-5 hrs sleepless)   10. Recreation 2 = I am able to  engage in most, but not all of my usual recreation activities because of   pain in my neck  Total 17/50    Posture Mild forward head, rounded shoulders  AROM Shoulder AROM  Flexion: R 83*, L 139  Abduction: R 45*, L 135  Functional ER: R T3, L T3  Functional IR:  R L2*, L T8   AROM (Normal range in degrees) AROM 03/21/24  Cervical  Flexion (50) WNL  Extension (80) 37  Right lateral flexion (45) 38  Left lateral flexion (45) 34  Right rotation (85) 70  Left rotation (85) 67  (* = pain; Blank rows = not tested)   MMT MMT (out of 5) Right 03/21/24 Left 03/21/24      Shoulder   Flexion 4-* 4  Extension    Abduction 3+* 4-  Internal rotation    Horizontal abduction    Horizontal adduction    Lower Trapezius    Rhomboids  Elbow  Flexion 4+* 5  Extension 4+ 4+  Pronation    Supination        Wrist  Flexion 4+ 5  Extension 4* (upper arm) 5  Radial deviation    Ulnar deviation    (* = pain; Blank rows = not tested)  Sensation Grossly intact to light touch bilateral UE as determined by testing dermatomes C2-T2. Proprioception and hot/cold testing deferred on this date.  Reflexes R/L Elbow: Unable to obtain Brachioradialis: Unable to obtain/1+  Tricep: 1+/1+  Palpation Location LEFT  RIGHT           Suboccipitals  0  Cervical paraspinals 0 0  Upper Trapezius 0 0  Levator Scapulae 0 1  Rhomboid Major/Minor  1  (Blank rows = not tested) Graded on 0-4 scale (0 = no pain, 1 = pain, 2 = pain with wincing/grimacing/flinching, 3 = pain with withdrawal, 4 = unwilling to allow palpation), (Blank rows = not tested)  Repeated Movements Repeated cervical retraction: no effect during, no effect after; Repeated cervical retraction-extension: no effect during, slight decrease after  Passive Accessory Intervertebral Motion No pain with CPA or UPA. Hypomobile C4-7 CPA. Decreased R to L sideglide at C4-7.   SPECIAL TESTS Spurlings A (ipsilateral lateral  flexion/axial compression): R: Positive for referred R upper quarter/arm pain L: Negative Distraction Test: Positive for pain relief  Hoffman Sign (cervical cord compression): R: Negative L: Negative ULTT Median: R: Positive L: Negative ULTT Ulnar: R: Negative L: Negative ULTT Radial: R: Negative L: Negative    TODAY'S TREATMENT    04/05/2024   SUBJECTIVE STATEMENT:   Patient reports symptoms are largely along R UT/paracervical region this AM. Patient reports 5-8/10 NPRS at arrival to PT.    Therapeutic Exercise - for improved soft tissue flexibility and extensibility as needed for ROM, improved strength as needed to improve performance of CKC activities/functional movements  Repeated retraction-extension, patient overpressure; 1 x 10,  1 sec   -remaining pain in upper arm, easing up after  Pt exhibits normal cervical spine AROM without reproduction of symptoms   PATIENT EDUCATION: HEP reviewed. Reviewed centralization phenomenon and discussed prognosis/expectations with successive bouts of repeated movement program.    Manual Therapy - for symptom modulation, soft tissue sensitivity and mobility, joint mobility, ROM    Manual cervical traction in supine; 10 sec on, 10 sec off; x 8 minutes for nerve root decompression and symptom modulation    Neuromuscular Re-education - for postural re-education, neural gliding/nerve mobilization for decreased sensitivity and improved neural/dural tissue mobility     TPR R upper trapezius; x 3 minutes for symptom modulation/decreased referred pain  _____________    Standing row; 2 x 10, 3 sec hold with Blue Tband   -tactile and verbal cueing/demo for scap depression   Seated Bruegger's posture endurance;   1x10 with Red Tband, 5 sec hold   1x10 with Yellow Tband, 10 sec hold      *not today* Passive median nerve flossing; 2 x 10 with conjunct cervical lateral flexion Median nerve glide, with arm propped on arm rest; 1 x 10, 1 sec  hold   -for HEP review to improve tolerance with nerve flossing Seated scapular retraction; 2 x 10, 3 sec hold    PATIENT EDUCATION:  Education details: see above for patient education details Person educated: Patient Education method: Explanation, Demonstration, and Handouts Education comprehension: verbalized understanding and returned demonstration   HOME EXERCISE PROGRAM:  Access Code: [email or web address] URL: [email or web address] Date: 04/03/2024  Prepared by: Venetia Endo  Exercises - Seated Cervical Retraction and Extension  - 5-6 x daily - 7 x weekly - 1 sets - 10 reps - 1sec hold - Median Nerve Flossing - Tray (Mirrored)  - 2-3 x daily - 7 x weekly - 2 sets - 10 reps - 1sec hold - Seated Scapular Retraction  - 2-3 x daily - 7 x weekly - 2 sets - 10 reps - 3sec hold - Standing Shoulder Row with Anchored Resistance  - 2 x daily - 7 x weekly - 2 sets - 10 reps - 3sec hold   ASSESSMENT:  CLINICAL IMPRESSION: Patient fortunately has lessening upper arm pain and apparent centralizing of R upper quarter pain with focus on repeated extension. Symptoms are modestly mitigated with traction, and UT/paracervical sensitivity is markedly diminished with use of TPR. Pt exhibits good cervical spine AROM at this time, and appears to be making fair early progress with R upper limb referred symptoms. Her extension program was updated today to include patient overpressure. Pt will continue to benefit from skilled PT services to address deficits and improve function.   OBJECTIVE IMPAIRMENTS: decreased ROM, decreased strength, hypomobility, impaired UE functional use, postural dysfunction, and pain.   ACTIVITY LIMITATIONS: carrying, lifting, sleeping, reach over head, and hygiene/grooming  PARTICIPATION LIMITATIONS: meal prep, cleaning, laundry, driving, shopping, and yard work  PERSONAL FACTORS: Age, Past/current experiences, and 3+ comorbidities: (Hx of breast cancer/R mastectomy,  HLD, osteopenia) are also affecting patient's functional outcome.   REHAB POTENTIAL: Good  CLINICAL DECISION MAKING: Evolving/moderate complexity  EVALUATION COMPLEXITY: Moderate   GOALS: Goals reviewed with patient? Yes  SHORT TERM GOALS: Target date: 04/13/2024  Pt will be independent with HEP to improve strength and decrease neck pain to improve pain-free function at home and work. Baseline: 03/21/24: Baseline repeated movement program initiated.  Goal status: INITIAL   LONG TERM GOALS: Target date: 05/02/2024  Pt will have access to full cervical spine AROM without reproduction of pain as needed for scanning environment, driving, and overhead activity Baseline: 03/21/24: Decreased C-spine extension and L>R rotation AROM.  Goal status: INITIAL  2.  Pt will decrease worst upper quarter pain by at least 3 points on the NPRS in order to demonstrate clinically significant reduction in neck pain. Baseline: 03/21/24: 8-10/10 at worst.  Goal status: INITIAL  3.  Pt will decrease NDI score by at least 19% in order demonstrate clinically significant reduction in neck pain/disability.       Baseline: 03/21/24: 17/50  Goal status: INITIAL  4.  Patient will have MMT 4+/5 or greater for deltoid and biceps without reproduction of symptoms indicative of improved strength and tolerance to load required for daily lifting and carrying tasks.  Baseline: 03/21/24: R shoulder flexion 4-, shoulder abduction 3+, biceps 4+ with pain reproduction.  Goal status: INITIAL   PLAN: PT FREQUENCY: 1-2x/week  PT DURATION: 6 weeks  PLANNED INTERVENTIONS: Therapeutic exercises, Therapeutic activity, Neuromuscular re-education, Balance training, Gait training, Patient/Family education, Self Care, Joint mobilization, Joint manipulation, Vestibular training, Canalith repositioning, Orthotic/Fit training, DME instructions, Dry Needling, Electrical stimulation, Spinal manipulation, Spinal mobilization,  Cryotherapy, Moist heat, Taping, Traction, Ultrasound, Ionotophoresis 4mg /ml Dexamethasone , Manual therapy, and Re-evaluation.  PLAN FOR NEXT SESSION: Utilize traction prn for symptom modulation. Re-assess response with repeated movement program and modify movement as needed. Continue with periscapular isometrics/isotonics. Neurodynamics.   Venetia Endo, PT, DPT #[id number]  Venetia ONEIDA Endo, PT 04/05/2024, 10:53 AM

## 2024-04-06 NOTE — Progress Notes (Unsigned)
 Referring Physician:  Marylynn Verneita CROME, MD [address] [place],  [place] [address]  Primary Physician:  Lori Verneita CROME, MD  History of Present Illness: 04/11/2024 [name] has a history of GERD, ovarian cyst, breast CA, hyperlipidemia.   2 month history of neck and right arm pain that started after doing yard work at her church. Pain has improved since onset.   She has constant neck pain with radiation to right arm to her shoulder. No significant arm pain. She has intermittent tingling in right hand/fingers. She feels like she has weakness in right arm. Pain is worse in the morning. Pain is better with walking.   Given medrol  dose pack and ultram  by PCP. Medrol  dose pack did help. She is taking prn tylenol.   She is in PT and it is helping.   Tobacco use: Does not smoke.   Bowel/Bladder Dysfunction: none  Conservative measures:  Physical therapy: initial eval with [facility] 03/21/2024 with 3 additional visits through 05/09/2024 Multimodal medical therapy including regular antiinflammatories:  Biofreeze, Tylenol, Tramadol , Medrol  dosepak Injections:  no epidural steroid injections  Past Surgery: no spine surgery  [name] has no symptoms of cervical myelopathy.  The symptoms are causing a significant impact on the patient's life.   Review of Systems:  A 10 point review of systems is negative, except for the pertinent positives and negatives detailed in the HPI.  Past Medical History: Past Medical History:  Diagnosis Date   Breast cancer (HCC) 2000   RT MASTECTOMY   GERD (gastroesophageal reflux disease)    Hyperlipidemia    Personal history of chemotherapy 2000   BREAST CA   Personal history of radiation therapy 2000   BREAST CA    Past Surgical History: Past Surgical History:  Procedure Laterality Date   BREAST SURGERY     mastectomy, left breast    COLONOSCOPY WITH PROPOFOL  N/A 02/10/2022   Procedure: COLONOSCOPY WITH PROPOFOL ;   Surgeon: Toledo, Ladell POUR, MD;  Location: [facility] ENDOSCOPY;  Service: Gastroenterology;  Laterality: N/A;   MASTECTOMY Right 2000   BREAST CA   ROBOTIC ASSISTED TOTAL HYSTERECTOMY  06/16/2021    Allergies: Allergies as of 04/11/2024   (No Known Allergies)    Medications: Outpatient Encounter Medications as of 04/11/2024  Medication Sig   aspirin 81 MG tablet Take 81 mg by mouth daily.   Cholecalciferol 25 MCG (1000 UT) tablet Take by mouth.   Docusate Sodium (DSS) 100 MG CAPS Take 2 capsules by mouth at bedtime.   lubiprostone  (AMITIZA ) 8 MCG capsule Take 1 capsule (8 mcg total) by mouth 2 (two) times daily with a meal.   Multiple Vitamin (MULTIVITAMIN) tablet Take 1 tablet by mouth daily.   simvastatin  (ZOCOR ) 40 MG tablet Take 1 tablet (40 mg total) by mouth every evening.   [DISCONTINUED] methylPREDNISolone  (MEDROL  DOSEPAK) 4 MG TBPK tablet As directed   [DISCONTINUED] Wheat Dextrin (BENEFIBER DRINK MIX PO) Take 1 Package by mouth 2 (two) times daily.   No facility-administered encounter medications on file as of 04/11/2024.    Social History: Social History   Tobacco Use   Smoking status: Never   Smokeless tobacco: Never  Vaping Use   Vaping status: Never Used  Substance Use Topics   Alcohol use: Yes    Comment: OCC   Drug use: No    Family Medical History: Family History  Problem Relation Age of Onset   Hypertension Mother    Heart disease Father  Cancer Maternal Grandmother    Breast cancer Neg Hx     Physical Examination: Vitals:   04/11/24 0847  BP: 128/80    General: Patient is well developed, well nourished, calm, collected, and in no apparent distress. Attention to examination is appropriate.  Respiratory: Patient is breathing without any difficulty.   NEUROLOGICAL:     Awake, alert, oriented to person, place, and time.  Speech is clear and fluent. Fund of knowledge is appropriate.   Cranial Nerves: Pupils equal round and reactive to light.   Facial tone is symmetric.    She has reasonable ROM of right shoulder with pain. No pain with IR/ER of shoulder. She has pain with stress of rotator cuff and positive impingement.   Good ROM of left shoulder with no pain.   No abnormal lesions on exposed skin.   Strength: Side Biceps Triceps Deltoid Interossei Grip Wrist Ext. Wrist Flex.  R 5 5 5 5 5 5 5   L 5 5 5 5 5 5 5    Side Iliopsoas Quads Hamstring PF DF EHL  R 5 5 5 5 5 5   L 5 5 5 5 5 5    Reflexes are 2+ and symmetric at the biceps, brachioradialis, patella and achilles.   Hoffman's is absent.  Clonus is not present.   Bilateral upper and lower extremity sensation is intact to light touch.     No pain with IR/ER of both hips.   Gait is normal.     Medical Decision Making  Imaging: Cervical xrays dated 02/21/24:  FINDINGS: Frontal, bilateral oblique, lateral views of the cervical spine are obtained. Mild left convex cervical scoliosis. Otherwise alignment is anatomic. No acute displaced fracture. Mild multilevel spondylosis and facet hypertrophy, with disc space narrowing most pronounced at the C5-6 and C6-7 levels. Evaluation of the neural foramina is limited by suboptimal positioning. Prevertebral soft tissues are unremarkable. Lung apices are clear.   IMPRESSION: 1. Mild multilevel cervical spondylosis and facet hypertrophy. 2. Mild left convex cervical scoliosis. 3. No acute displaced fracture.     Electronically Signed   By: Ozell Daring M.D.   On: 02/22/2024 21:17        I have personally reviewed the images and agree with the above interpretation.  Assessment and Plan: Ms. Hindle has a 2 month history of constant neck pain with radiation to right arm to her shoulder. No significant arm pain. She has intermittent tingling in right hand/fingers. She feels like she has weakness in right arm.   She has known cervical spondylosis with mild DDD C5-C7.   Some of her pain appears to be right shoulder  mediated- she has pain with ROM, pain with stress of rotator cuff, and positive impingement.  Treatment options discussed with patient and following plan made:   - MRI of cervical spine to further evaluate neck and right arm/shoulder pain.  - Continue with PT for cervical spine.  - Referral to ortho [facility]) for right shoulder pain.  - Will schedule follow up visit to review MRI results once I get them back.   I spent a total of 35 minutes in face-to-face and non-face-to-face activities related to this patient's care today including review of outside records, review of imaging, review of symptoms, physical exam, discussion of differential diagnosis, discussion of treatment options, and documentation.   Thank you for involving me in the care of this patient.   Glade Boys PA-C Dept. of Neurosurgery

## 2024-04-09 ENCOUNTER — Encounter: Payer: Self-pay | Admitting: Physical Therapy

## 2024-04-09 ENCOUNTER — Ambulatory Visit

## 2024-04-09 DIAGNOSIS — M5412 Radiculopathy, cervical region: Secondary | ICD-10-CM

## 2024-04-09 DIAGNOSIS — M6281 Muscle weakness (generalized): Secondary | ICD-10-CM

## 2024-04-09 DIAGNOSIS — M79601 Pain in right arm: Secondary | ICD-10-CM

## 2024-04-09 NOTE — Therapy (Signed)
 OUTPATIENT PHYSICAL THERAPY TREATMENT   Patient Name: Lori Caldwell MRN: [id number] DOB:[birth year], 83 y.o., female Today's Date: 04/09/2024  END OF SESSION:  PT End of Session - 04/09/24 0946     Visit Number 5    Number of Visits 13    Date for Recertification  05/02/24    Authorization Type Medicare A&B 2025    PT Start Time 0945    PT Stop Time 1025    PT Time Calculation (min) 40 min    Activity Tolerance Patient limited by pain    Behavior During Therapy [facility] for tasks assessed/performed            Past Medical History:  Diagnosis Date   Breast cancer (HCC) 2000   RT MASTECTOMY   GERD (gastroesophageal reflux disease)    Hyperlipidemia    Personal history of chemotherapy 2000   BREAST CA   Personal history of radiation therapy 2000   BREAST CA   Past Surgical History:  Procedure Laterality Date   BREAST SURGERY     mastectomy, left breast    COLONOSCOPY WITH PROPOFOL  N/A 02/10/2022   Procedure: COLONOSCOPY WITH PROPOFOL ;  Surgeon: Toledo, Ladell POUR, MD;  Location: [facility] ENDOSCOPY;  Service: Gastroenterology;  Laterality: N/A;   MASTECTOMY Right 2000   BREAST CA   ROBOTIC ASSISTED TOTAL HYSTERECTOMY  06/16/2021   Patient Active Problem List   Diagnosis Date Noted   Ecchymosis 10/02/2023   Pelvic mass in female 05/11/2021   Ovarian cyst 04/29/2021   Constipation 11/21/2020   Basal cell carcinoma of chin 06/24/2020   S/P right mastectomy 06/23/2020   Allergic rhinitis 07/28/2017   Colon cancer screening 03/14/2017   History of syncope 01/16/2015   Medicare annual wellness visit, subsequent 12/24/2012   Osteopenia due to cancer therapy 10/22/2011   Personal history of breast cancer 04/13/2011   Screening for cervical cancer 04/13/2011   Hyperlipidemia    GERD (gastroesophageal reflux disease)     PCP: Marylynn Verneita CROME, MD  REFERRING PROVIDER: Marylynn Verneita CROME, MD  REFERRING DIAG: [phone] (ICD-10-CM) - Cervical spondylitis with radiculitis    RATIONALE FOR EVALUATION AND TREATMENT: Rehabilitation  THERAPY DIAG: Radiculopathy, cervical region  Pain in right arm  Muscle weakness (generalized)  ONSET DATE: 02/16/24, began with R arm pain   FOLLOW-UP APPT SCHEDULED WITH REFERRING PROVIDER: No   PERTINENT HISTORY: Patient is an 83 year old female referred for upper quarter pain; R upper arm pain with referring diagnosis of cervical spondylitis with radiculitis. Pt had prednisone  taper and this notably improved pain. Pt is active and likes to exercise. Patient has f/u with neurosurgery 04/11/24. Pt felt that working in her yard let to onset of symptoms. Pt reports continuing with resistance training exercises at the time, and she feels that this may have aggravated it too. Patient reports difficulty with getting adequate sleep. Patient reports sleeping 3-4 hours, and then she's up and down through the remainder of the night. Pt is s/p R mastectomy 26 years ago. Pt reports pain in R upper arm. She reports pain has been worse in afternoon/evening generally.   Pain:  Pain Intensity: Present: 5/10, Best: 0/10, Worst: 8-10/10 Pain location: Upper arm/middle deltoid region Pain Quality: burning  Radiating: Yes ; R upper arm  Numbness/Tingling: Yes; affecting no specific digits, tingling into thenar region notably  Focal Weakness: Yes, harder to manipulate items with R hand Aggravating factors: pushing, pulling, knitting for prolonged period, pushing down for sit to stand Relieving factors: when walking/on  the move,  24-hour pain behavior: worse at night time History of prior neck injury, pain, surgery, or therapy: No  Dominant hand: right Imaging: Yes ;  CLINICAL DATA:  Neck pain   EXAM: DG CERVICAL SPINE COMPLETE 4+V   COMPARISON:  None Available.   FINDINGS: Frontal, bilateral oblique, lateral views of the cervical spine are obtained. Mild left convex cervical scoliosis. Otherwise alignment is anatomic. No acute displaced  fracture. Mild multilevel spondylosis and facet hypertrophy, with disc space narrowing most pronounced at the C5-6 and C6-7 levels. Evaluation of the neural foramina is limited by suboptimal positioning. Prevertebral soft tissues are unremarkable. Lung apices are clear.   IMPRESSION: 1. Mild multilevel cervical spondylosis and facet hypertrophy. 2. Mild left convex cervical scoliosis. 3. No acute displaced fracture.     Electronically Signed   By: Ozell Daring M.D.   On: 02/22/2024 21:17   Red flags (personal history of cancer, h/o spinal tumors, history of compression fracture, chills/fever, night sweats, nausea, vomiting, unrelenting pain): Positive for Hx of breast cancer   PRECAUTIONS: None  WEIGHT BEARING RESTRICTIONS: No  FALLS: Has patient fallen in last 6 months? No  Living Environment Lives with: lives with their spouse Lives in: House/apartment  Prior level of function: Independent  Occupational demands: Retired  Presenter, Broadcasting: Archivist, working in yard  Patient Goals: Sleep better, not hurt as much    OBJECTIVE (data from initial evaluation unless otherwise dated):   Patient Surveys  NDI:  NECK DISABILITY INDEX  Date: 03/21/24 Score  Pain intensity 2 = The pain is moderate at the moment  2. Personal care (washing, dressing, etc.) 1 =  I can look after myself normally but it causes extra pain  3. Lifting 2 = Pain prevents me lifting heavy weights off the floor, but I can manage if they are  conveniently placed, for example on a table  4. Reading 1 = I can read as much as I want to with slight pain in my neck  5. Headaches 0 = I have no headaches at all  6. Concentration 1 =  I can concentrate fully when I want to with slight difficulty   7. Work 3 =  I cannot do my usual work  8. Driving 1 =  I can drive my car as long as I want with slight pain in my neck  9. Sleeping 4 = My sleep is greatly disturbed (3-5 hrs sleepless)   10. Recreation 2 = I am able to  engage in most, but not all of my usual recreation activities because of   pain in my neck  Total 17/50    Posture Mild forward head, rounded shoulders  AROM Shoulder AROM  Flexion: R 83*, L 139  Abduction: R 45*, L 135  Functional ER: R T3, L T3  Functional IR:  R L2*, L T8   AROM (Normal range in degrees) AROM 03/21/24  Cervical  Flexion (50) WNL  Extension (80) 37  Right lateral flexion (45) 38  Left lateral flexion (45) 34  Right rotation (85) 70  Left rotation (85) 67  (* = pain; Blank rows = not tested)   MMT MMT (out of 5) Right 03/21/24 Left 03/21/24      Shoulder   Flexion 4-* 4  Extension    Abduction 3+* 4-  Internal rotation    Horizontal abduction    Horizontal adduction    Lower Trapezius    Rhomboids  Elbow  Flexion 4+* 5  Extension 4+ 4+  Pronation    Supination        Wrist  Flexion 4+ 5  Extension 4* (upper arm) 5  Radial deviation    Ulnar deviation    (* = pain; Blank rows = not tested)  Sensation Grossly intact to light touch bilateral UE as determined by testing dermatomes C2-T2. Proprioception and hot/cold testing deferred on this date.  Reflexes R/L Elbow: Unable to obtain Brachioradialis: Unable to obtain/1+  Tricep: 1+/1+  Palpation Location LEFT  RIGHT           Suboccipitals  0  Cervical paraspinals 0 0  Upper Trapezius 0 0  Levator Scapulae 0 1  Rhomboid Major/Minor  1  (Blank rows = not tested) Graded on 0-4 scale (0 = no pain, 1 = pain, 2 = pain with wincing/grimacing/flinching, 3 = pain with withdrawal, 4 = unwilling to allow palpation), (Blank rows = not tested)  Repeated Movements Repeated cervical retraction: no effect during, no effect after; Repeated cervical retraction-extension: no effect during, slight decrease after  Passive Accessory Intervertebral Motion No pain with CPA or UPA. Hypomobile C4-7 CPA. Decreased R to L sideglide at C4-7.   SPECIAL TESTS Spurlings A (ipsilateral lateral  flexion/axial compression): R: Positive for referred R upper quarter/arm pain L: Negative Distraction Test: Positive for pain relief  Hoffman Sign (cervical cord compression): R: Negative L: Negative ULTT Median: R: Positive L: Negative ULTT Ulnar: R: Negative L: Negative ULTT Radial: R: Negative L: Negative    TODAY'S TREATMENT    04/09/2024   SUBJECTIVE STATEMENT:   Patient reports symptoms are largely along R UT/paracervical region this AM. Patient reports 5-8/10 NPRS at arrival to PT.    Therapeutic Exercise - for improved soft tissue flexibility and extensibility as needed for ROM, improved strength as needed to improve performance of CKC activities/functional movements    Seated UT stretch 2 x 30 seconds side - focus on gravity for stretch vs using hand to provide overpressure    Seated levator stretch 2 x 30 seconds each side  PATIENT EDUCATION: HEP reviewed. Reviewed centralization phenomenon and discussed prognosis/expectations with successive bouts of repeated movement program.    Manual Therapy - for symptom modulation, soft tissue sensitivity and mobility, joint mobility, ROM    Manual cervical traction in supine; 10 sec on, 10 sec off; x 8 minutes for nerve root decompression and symptom modulation   Neuromuscular Re-education - for postural re-education, neural gliding/nerve mobilization for decreased sensitivity and improved neural/dural tissue mobility     Standing OH row; 2 x 15, 3 sec hold with Green Tband   -tactile and verbal cueing/demo for scap depression   Standing with foam roll along spine vertically with B shoulder horizontal abduction with GTB 2 x 15    Standing B shoulder ER with scapular retraction with YTB 2 x 15    Standing shoulder shrugs with 3# 2x15  *not today* Passive median nerve flossing; 2 x 10 with conjunct cervical lateral flexion Median nerve glide, with arm propped on arm rest; 1 x 10, 1 sec hold   -for HEP review to improve  tolerance with nerve flossing Seated scapular retraction; 2 x 10, 3 sec hold    PATIENT EDUCATION:  Education details: see above for patient education details Person educated: Patient Education method: Explanation, Demonstration, and Handouts Education comprehension: verbalized understanding and returned demonstration   HOME EXERCISE PROGRAM:  Access Code: [email or web address] URL: [email or web address] Date: 04/03/2024  Prepared by: Venetia Endo  Exercises - Seated Cervical Retraction and Extension  - 5-6 x daily - 7 x weekly - 1 sets - 10 reps - 1sec hold - Median Nerve Flossing - Tray (Mirrored)  - 2-3 x daily - 7 x weekly - 2 sets - 10 reps - 1sec hold - Seated Scapular Retraction  - 2-3 x daily - 7 x weekly - 2 sets - 10 reps - 3sec hold - Standing Shoulder Row with Anchored Resistance  - 2 x daily - 7 x weekly - 2 sets - 10 reps - 3sec hold   ASSESSMENT:  CLINICAL IMPRESSION:   Continued PT POC focused on cervical radiculitis with R UE symptoms. Session focused on manual cervical distraction as well as UT/levator stretching in combination with scapular strengthening with resistance. Tolerated session well with no increase in symptoms. Patient feels as though her posture is improving. She has appointment with neurosurgery on 12/10 for further management of symptoms. Pt will continue to benefit from skilled PT services to address deficits and improve function.   OBJECTIVE IMPAIRMENTS: decreased ROM, decreased strength, hypomobility, impaired UE functional use, postural dysfunction, and pain.   ACTIVITY LIMITATIONS: carrying, lifting, sleeping, reach over head, and hygiene/grooming  PARTICIPATION LIMITATIONS: meal prep, cleaning, laundry, driving, shopping, and yard work  PERSONAL FACTORS: Age, Past/current experiences, and 3+ comorbidities: (Hx of breast cancer/R mastectomy, HLD, osteopenia) are also affecting patient's functional outcome.   REHAB POTENTIAL:  Good  CLINICAL DECISION MAKING: Evolving/moderate complexity  EVALUATION COMPLEXITY: Moderate   GOALS: Goals reviewed with patient? Yes  SHORT TERM GOALS: Target date: 04/13/2024  Pt will be independent with HEP to improve strength and decrease neck pain to improve pain-free function at home and work. Baseline: 03/21/24: Baseline repeated movement program initiated.  Goal status: INITIAL   LONG TERM GOALS: Target date: 05/02/2024  Pt will have access to full cervical spine AROM without reproduction of pain as needed for scanning environment, driving, and overhead activity Baseline: 03/21/24: Decreased C-spine extension and L>R rotation AROM.  Goal status: INITIAL  2.  Pt will decrease worst upper quarter pain by at least 3 points on the NPRS in order to demonstrate clinically significant reduction in neck pain. Baseline: 03/21/24: 8-10/10 at worst.  Goal status: INITIAL  3.  Pt will decrease NDI score by at least 19% in order demonstrate clinically significant reduction in neck pain/disability.       Baseline: 03/21/24: 17/50  Goal status: INITIAL  4.  Patient will have MMT 4+/5 or greater for deltoid and biceps without reproduction of symptoms indicative of improved strength and tolerance to load required for daily lifting and carrying tasks.  Baseline: 03/21/24: R shoulder flexion 4-, shoulder abduction 3+, biceps 4+ with pain reproduction.  Goal status: INITIAL   PLAN: PT FREQUENCY: 1-2x/week  PT DURATION: 6 weeks  PLANNED INTERVENTIONS: Therapeutic exercises, Therapeutic activity, Neuromuscular re-education, Balance training, Gait training, Patient/Family education, Self Care, Joint mobilization, Joint manipulation, Vestibular training, Canalith repositioning, Orthotic/Fit training, DME instructions, Dry Needling, Electrical stimulation, Spinal manipulation, Spinal mobilization, Cryotherapy, Moist heat, Taping, Traction, Ultrasound, Ionotophoresis 4mg /ml Dexamethasone ,  Manual therapy, and Re-evaluation.  PLAN FOR NEXT SESSION: Utilize traction prn for symptom modulation. Re-assess response with repeated movement program and modify movement as needed. Continue with periscapular isometrics/isotonics. Neurodynamics.   Maryanne Finder, PT, DPT Physical Therapist - [facility]  [facility]  Maryanne DELENA Finder, PT 04/09/2024, 10:51 AM

## 2024-04-11 ENCOUNTER — Ambulatory Visit: Admitting: Physical Therapy

## 2024-04-11 ENCOUNTER — Encounter: Payer: Self-pay | Admitting: Orthopedic Surgery

## 2024-04-11 ENCOUNTER — Ambulatory Visit (INDEPENDENT_AMBULATORY_CARE_PROVIDER_SITE_OTHER): Admitting: Orthopedic Surgery

## 2024-04-11 VITALS — BP 128/80 | Ht 67.0 in | Wt 163.1 lb

## 2024-04-11 DIAGNOSIS — M47812 Spondylosis without myelopathy or radiculopathy, cervical region: Secondary | ICD-10-CM

## 2024-04-11 DIAGNOSIS — M50322 Other cervical disc degeneration at C5-C6 level: Secondary | ICD-10-CM

## 2024-04-11 DIAGNOSIS — M25511 Pain in right shoulder: Secondary | ICD-10-CM | POA: Diagnosis not present

## 2024-04-11 DIAGNOSIS — M5412 Radiculopathy, cervical region: Secondary | ICD-10-CM

## 2024-04-11 DIAGNOSIS — M4722 Other spondylosis with radiculopathy, cervical region: Secondary | ICD-10-CM | POA: Diagnosis not present

## 2024-04-11 DIAGNOSIS — M50323 Other cervical disc degeneration at C6-C7 level: Secondary | ICD-10-CM

## 2024-04-11 DIAGNOSIS — M5030 Other cervical disc degeneration, unspecified cervical region: Secondary | ICD-10-CM

## 2024-04-11 NOTE — Patient Instructions (Signed)
 It was so nice to see you today. Thank you so much for coming in.    You have some mild wear and tear in your neck- this may be causing your neck and right arm pain. I think some of your right arm pain may also be from your shoulder.   I want to get an MRI of your neck to look into things further. We will get this approved through your insurance and [facility] will call you to schedule the appointment. Ask about your patient responsibility. You do not need to pay this prior to getting MRI, they can bill you.   [facility] is located at [address] in [place]. This is near the intersection of [address] and [address].   After you have the MRI, it can take 14-28 days for me to get the results back. If I don't have them in 2 weeks, we will call to try to get the results.   Once I have the results, we will call you to schedule a follow up visit with me to review them.   Continue with PT for your neck.   I want you to see [facility] here at our clinic for your right shoulder. Dr. Gust and his PA Sam are great and will take good care of you. They should call you to schedule an appointment or you can call them at [phone].   Please do not hesitate to call if you have any questions or concerns. You can also message me in MyChart.   Glade Boys PA-C [phone]     The physicians and staff at [facility] at [facility] are committed to providing excellent care. You may receive a survey asking for feedback about your experience at our office. We value you your feedback and appreciate you taking the time to to fill it out. The [facility] leadership team is also available to discuss your experience in person, feel free to contact us  [phone].

## 2024-04-16 ENCOUNTER — Encounter: Payer: Self-pay | Admitting: Physical Therapy

## 2024-04-16 ENCOUNTER — Ambulatory Visit: Admitting: Physical Therapy

## 2024-04-16 DIAGNOSIS — M5412 Radiculopathy, cervical region: Secondary | ICD-10-CM | POA: Diagnosis not present

## 2024-04-16 DIAGNOSIS — M79601 Pain in right arm: Secondary | ICD-10-CM

## 2024-04-16 DIAGNOSIS — M6281 Muscle weakness (generalized): Secondary | ICD-10-CM

## 2024-04-16 NOTE — Therapy (Signed)
 OUTPATIENT PHYSICAL THERAPY TREATMENT   Patient Name: Lori Caldwell MRN: [id number] DOB:02/05/1941, 83 y.o., female Today's Date: 04/16/2024  END OF SESSION:  PT End of Session - 04/16/24 0952     Visit Number 6    Number of Visits 13    Date for Recertification  05/02/24    Authorization Type Medicare A&B 2025    PT Start Time 0952    PT Stop Time 1031    PT Time Calculation (min) 39 min    Activity Tolerance Patient limited by pain    Behavior During Therapy [facility] for tasks assessed/performed          Past Medical History:  Diagnosis Date   Breast cancer (HCC) 2000   RT MASTECTOMY   GERD (gastroesophageal reflux disease)    Hyperlipidemia    Personal history of chemotherapy 2000   BREAST CA   Personal history of radiation therapy 2000   BREAST CA   Past Surgical History:  Procedure Laterality Date   BREAST SURGERY     mastectomy, left breast    COLONOSCOPY WITH PROPOFOL  N/A 02/10/2022   Procedure: COLONOSCOPY WITH PROPOFOL ;  Surgeon: Toledo, Ladell POUR, MD;  Location: [facility] ENDOSCOPY;  Service: Gastroenterology;  Laterality: N/A;   MASTECTOMY Right 2000   BREAST CA   ROBOTIC ASSISTED TOTAL HYSTERECTOMY  06/16/2021   Patient Active Problem List   Diagnosis Date Noted   Ecchymosis 10/02/2023   Pelvic mass in female 05/11/2021   Ovarian cyst 04/29/2021   Constipation 11/21/2020   Basal cell carcinoma of chin 06/24/2020   S/P right mastectomy 06/23/2020   Allergic rhinitis 07/28/2017   Colon cancer screening 03/14/2017   History of syncope 01/16/2015   Medicare annual wellness visit, subsequent 12/24/2012   Osteopenia due to cancer therapy 10/22/2011   Personal history of breast cancer 04/13/2011   Screening for cervical cancer 04/13/2011   Hyperlipidemia    GERD (gastroesophageal reflux disease)     PCP: Marylynn Verneita CROME, MD  REFERRING PROVIDER: Marylynn Verneita CROME, MD  REFERRING DIAG: [phone] (ICD-10-CM) - Cervical spondylitis with radiculitis    RATIONALE FOR EVALUATION AND TREATMENT: Rehabilitation  THERAPY DIAG: Radiculopathy, cervical region  Pain in right arm  Muscle weakness (generalized)  ONSET DATE: 02/16/24, began with R arm pain   FOLLOW-UP APPT SCHEDULED WITH REFERRING PROVIDER: No   PERTINENT HISTORY: Patient is an 83 year old female referred for upper quarter pain; R upper arm pain with referring diagnosis of cervical spondylitis with radiculitis. Pt had prednisone  taper and this notably improved pain. Pt is active and likes to exercise. Patient has f/u with neurosurgery 04/11/24. Pt felt that working in her yard let to onset of symptoms. Pt reports continuing with resistance training exercises at the time, and she feels that this may have aggravated it too. Patient reports difficulty with getting adequate sleep. Patient reports sleeping 3-4 hours, and then she's up and down through the remainder of the night. Pt is s/p R mastectomy 26 years ago. Pt reports pain in R upper arm. She reports pain has been worse in afternoon/evening generally.   Pain:  Pain Intensity: Present: 5/10, Best: 0/10, Worst: 8-10/10 Pain location: Upper arm/middle deltoid region Pain Quality: burning  Radiating: Yes ; R upper arm  Numbness/Tingling: Yes; affecting no specific digits, tingling into thenar region notably  Focal Weakness: Yes, harder to manipulate items with R hand Aggravating factors: pushing, pulling, knitting for prolonged period, pushing down for sit to stand Relieving factors: when walking/on the move,  24-hour pain behavior: worse at night time History of prior neck injury, pain, surgery, or therapy: No  Dominant hand: right Imaging: Yes ;  CLINICAL DATA:  Neck pain   EXAM: DG CERVICAL SPINE COMPLETE 4+V   COMPARISON:  None Available.   FINDINGS: Frontal, bilateral oblique, lateral views of the cervical spine are obtained. Mild left convex cervical scoliosis. Otherwise alignment is anatomic. No acute displaced  fracture. Mild multilevel spondylosis and facet hypertrophy, with disc space narrowing most pronounced at the C5-6 and C6-7 levels. Evaluation of the neural foramina is limited by suboptimal positioning. Prevertebral soft tissues are unremarkable. Lung apices are clear.   IMPRESSION: 1. Mild multilevel cervical spondylosis and facet hypertrophy. 2. Mild left convex cervical scoliosis. 3. No acute displaced fracture.     Electronically Signed   By: Ozell Daring M.D.   On: 02/22/2024 21:17   Red flags (personal history of cancer, h/o spinal tumors, history of compression fracture, chills/fever, night sweats, nausea, vomiting, unrelenting pain): Positive for Hx of breast cancer   PRECAUTIONS: None  WEIGHT BEARING RESTRICTIONS: No  FALLS: Has patient fallen in last 6 months? No  Living Environment Lives with: lives with their spouse Lives in: House/apartment  Prior level of function: Independent  Occupational demands: Retired  Presenter, Broadcasting: Archivist, working in yard  Patient Goals: Sleep better, not hurt as much    OBJECTIVE (data from initial evaluation unless otherwise dated):   Patient Surveys  NDI:  NECK DISABILITY INDEX  Date: 03/21/24 Score  Pain intensity 2 = The pain is moderate at the moment  2. Personal care (washing, dressing, etc.) 1 =  I can look after myself normally but it causes extra pain  3. Lifting 2 = Pain prevents me lifting heavy weights off the floor, but I can manage if they are  conveniently placed, for example on a table  4. Reading 1 = I can read as much as I want to with slight pain in my neck  5. Headaches 0 = I have no headaches at all  6. Concentration 1 =  I can concentrate fully when I want to with slight difficulty   7. Work 3 =  I cannot do my usual work  8. Driving 1 =  I can drive my car as long as I want with slight pain in my neck  9. Sleeping 4 = My sleep is greatly disturbed (3-5 hrs sleepless)   10. Recreation 2 = I am able to  engage in most, but not all of my usual recreation activities because of   pain in my neck  Total 17/50    Posture Mild forward head, rounded shoulders  AROM Shoulder AROM  Flexion: R 83*, L 139  Abduction: R 45*, L 135  Functional ER: R T3, L T3  Functional IR:  R L2*, L T8   AROM (Normal range in degrees) AROM 03/21/24  Cervical  Flexion (50) WNL  Extension (80) 37  Right lateral flexion (45) 38  Left lateral flexion (45) 34  Right rotation (85) 70  Left rotation (85) 67  (* = pain; Blank rows = not tested)   MMT MMT (out of 5) Right 03/21/24 Left 03/21/24      Shoulder   Flexion 4-* 4  Extension    Abduction 3+* 4-  External rotation 4- 4-  Internal rotation    Horizontal abduction    Horizontal adduction    Lower Trapezius    Rhomboids  Elbow  Flexion 4+* 5  Extension 4+ 4+  Pronation    Supination        Wrist  Flexion 4+ 5  Extension 4* (upper arm) 5  Radial deviation    Ulnar deviation    (* = pain; Blank rows = not tested)  Sensation Grossly intact to light touch bilateral UE as determined by testing dermatomes C2-T2. Proprioception and hot/cold testing deferred on this date.  Reflexes R/L Elbow: Unable to obtain Brachioradialis: Unable to obtain/1+  Tricep: 1+/1+  Palpation Location LEFT  RIGHT           Suboccipitals  0  Cervical paraspinals 0 0  Upper Trapezius 0 0  Levator Scapulae 0 1  Rhomboid Major/Minor  1  (Blank rows = not tested) Graded on 0-4 scale (0 = no pain, 1 = pain, 2 = pain with wincing/grimacing/flinching, 3 = pain with withdrawal, 4 = unwilling to allow palpation), (Blank rows = not tested)  Repeated Movements Repeated cervical retraction: no effect during, no effect after; Repeated cervical retraction-extension: no effect during, slight decrease after  Passive Accessory Intervertebral Motion No pain with CPA or UPA. Hypomobile C4-7 CPA. Decreased R to L sideglide at C4-7.   SPECIAL TESTS Spurlings A  (ipsilateral lateral flexion/axial compression): R: Positive for referred R upper quarter/arm pain L: Negative Distraction Test: Positive for pain relief  Hoffman Sign (cervical cord compression): R: Negative L: Negative ULTT Median: R: Positive L: Negative ULTT Ulnar: R: Negative L: Negative ULTT Radial: R: Negative L: Negative  Update 04/16/24 Empty Can Test: R Positive, L Negative External rotation MMT: Negative for pain provocation  Drop Arm Test: Negative Painful Arc: Negative    TODAY'S TREATMENT    04/16/2024   SUBJECTIVE STATEMENT:   Pt is awaiting MRI for cervical spine tomorrow morning. Patient reports she has intermittent disturbed sleep, tossing and turning due to pain in R shoulder. Patient reports symptoms are intermittent. She reports not having major soreness after last visit. Patient reports 5-6/10 pain affecting; pt reports pain is largely localized along upper arm presently. She feels that she has improved, but upper arm pain is persistent.    AROM Cervical flexion:  WNL Cervical extension: min motion loss Lateral flexion: Right WNL , Left WNL Cervical rotation: Right WNL, Left WNL *Indicates pain   Shoulder flexion: R 131, L 137  Shoulder abduction: R 130, L 151  Empty Can Test: R Positive, L Negative External rotation MMT: Negative for pain provocation  Drop Arm Test: Negative Painful Arc: Negative   Therapeutic Exercise - for improved soft tissue flexibility and extensibility as needed for ROM, improved strength as needed to improve performance of CKC activities/functional movements     Repeated retraction-extension with patient overpressure; 1 x 10    -reviewed technique and ensured pt performed swivel overpressure technique at end-range    -monitored for any adverse effects with cervical extension e.g. dizziness/faintness    Supine shoulder flexion AAROM with dowel; 2 x 10  PATIENT EDUCATION: HEP reviewed. Reviewed centralization phenomenon and  discussed prognosis/expectations with successive bouts of repeated movement program.    Manual Therapy - for symptom modulation, soft tissue sensitivity and mobility, joint mobility, ROM    Manual cervical traction in supine; 10 sec on, 10 sec off; x 8 minutes for nerve root decompression and symptom modulation  -minimal pain in lying after completion of traction, can feel mild sensation in R upper arm that is not painful afterward   Neuromuscular Re-education -  for postural re-education, neural gliding/nerve mobilization for decreased sensitivity and improved neural/dural tissue mobility      Standing B shoulder ER with scapular retraction with Red TB 2 x 10   -back to wall for postural cue    *next visit*  Standing OH row; 2 x 15, 3 sec hold with Green Tband   -tactile and verbal cueing/demo for scap depression  *not today* Standing with foam roll along spine vertically with B shoulder horizontal abduction with GTB 2 x 15  Standing shoulder shrugs with 3# 2x15 Passive median nerve flossing; 2 x 10 with conjunct cervical lateral flexion Median nerve glide, with arm propped on arm rest; 1 x 10, 1 sec hold   -for HEP review to improve tolerance with nerve flossing Seated scapular retraction; 2 x 10, 3 sec hold    PATIENT EDUCATION:  Education details: see above for patient education details Person educated: Patient Education method: Explanation, Demonstration, and Handouts Education comprehension: verbalized understanding and returned demonstration   HOME EXERCISE PROGRAM:  Access Code: [email or web address] URL: [email or web address] Date: 04/16/2024 Prepared by: Venetia Endo  Exercises - Seated Cervical Retraction and Extension  - 5-6 x daily - 7 x weekly - 1 sets - 10 reps - 1sec hold - Median Nerve Flossing - Tray (Mirrored)  - 2-3 x daily - 7 x weekly - 2 sets - 10 reps - 1sec hold - Seated Scapular Retraction  - 2-3 x daily - 7 x weekly - 2 sets - 10 reps -  3sec hold - Supine Shoulder Flexion Extension AAROM with Dowel  - 2 x daily - 7 x weekly - 2 sets - 10 reps - Standing Shoulder Row with Anchored Resistance  - 1 x daily - 7 x weekly - 2 sets - 10 reps - 3sec hold - Shoulder External Rotation and Scapular Retraction with Resistance  - 1 x daily - 7 x weekly - 2 sets - 10 reps   ASSESSMENT:  CLINICAL IMPRESSION:   Pt had f/u with [facility] on 04/11/24 and is awaiting further advanced imaging for R upper quarter pain. Pt had discussed potential peripheral contributors to current pain, and we further checked for rotator cuff-related shoulder pain. Pt has painful Empty Can test, but denies notable painful arc and has negative drop arm test. Massive RTC tear is not expected, but pt may have rotator-cuff related shoulder pain and could have some age-related degenerative RTC involvement. Symptoms have improved moderately with focus on cervical spine intervention and pt exhibits improved cervical spine and shoulder AROM, but NPRS for R upper arm pain remains relatively consistent. Pt will complete MRI tomorrow to further check for cervical spine involvement. We updated her program to address impairments related to R shoulder and promote improved RTC strength/function. Pt will continue to benefit from skilled PT services to address deficits and improve function.   OBJECTIVE IMPAIRMENTS: decreased ROM, decreased strength, hypomobility, impaired UE functional use, postural dysfunction, and pain.   ACTIVITY LIMITATIONS: carrying, lifting, sleeping, reach over head, and hygiene/grooming  PARTICIPATION LIMITATIONS: meal prep, cleaning, laundry, driving, shopping, and yard work  PERSONAL FACTORS: Age, Past/current experiences, and 3+ comorbidities: (Hx of breast cancer/R mastectomy, HLD, osteopenia) are also affecting patient's functional outcome.   REHAB POTENTIAL: Good  CLINICAL DECISION MAKING: Evolving/moderate complexity  EVALUATION COMPLEXITY:  Moderate   GOALS: Goals reviewed with patient? Yes  SHORT TERM GOALS: Target date: 04/13/2024  Pt will be independent with HEP to improve strength and decrease neck pain to improve  pain-free function at home and work. Baseline: 03/21/24: Baseline repeated movement program initiated.  Goal status: INITIAL   LONG TERM GOALS: Target date: 05/02/2024  Pt will have access to full cervical spine AROM without reproduction of pain as needed for scanning environment, driving, and overhead activity Baseline: 03/21/24: Decreased C-spine extension and L>R rotation AROM.  Goal status: INITIAL  2.  Pt will decrease worst upper quarter pain by at least 3 points on the NPRS in order to demonstrate clinically significant reduction in neck pain. Baseline: 03/21/24: 8-10/10 at worst.  Goal status: INITIAL  3.  Pt will decrease NDI score by at least 19% in order demonstrate clinically significant reduction in neck pain/disability.       Baseline: 03/21/24: 17/50  Goal status: INITIAL  4.  Patient will have MMT 4+/5 or greater for deltoid and biceps without reproduction of symptoms indicative of improved strength and tolerance to load required for daily lifting and carrying tasks.  Baseline: 03/21/24: R shoulder flexion 4-, shoulder abduction 3+, biceps 4+ with pain reproduction.  Goal status: INITIAL   PLAN: PT FREQUENCY: 1-2x/week  PT DURATION: 6 weeks  PLANNED INTERVENTIONS: Therapeutic exercises, Therapeutic activity, Neuromuscular re-education, Balance training, Gait training, Patient/Family education, Self Care, Joint mobilization, Joint manipulation, Vestibular training, Canalith repositioning, Orthotic/Fit training, DME instructions, Dry Needling, Electrical stimulation, Spinal manipulation, Spinal mobilization, Cryotherapy, Moist heat, Taping, Traction, Ultrasound, Ionotophoresis 4mg /ml Dexamethasone , Manual therapy, and Re-evaluation.  PLAN FOR NEXT SESSION: Utilize traction prn for  symptom modulation. Re-assess response with repeated movement program and modify movement as needed. Continue with periscapular isometrics/isotonics. Neurodynamics. Continue with addition of posterior cuff strengthening and progressive upper limb PRE as tolerated.   Venetia Endo, PT, DPT [phone]  Physical Therapist - [facility]  Venetia ONEIDA Endo, [place] 04/16/2024, 9:52 AM

## 2024-04-17 ENCOUNTER — Inpatient Hospital Stay
Admission: RE | Admit: 2024-04-17 | Discharge: 2024-04-17 | Attending: Orthopedic Surgery | Admitting: Orthopedic Surgery

## 2024-04-17 DIAGNOSIS — M4722 Other spondylosis with radiculopathy, cervical region: Secondary | ICD-10-CM | POA: Diagnosis not present

## 2024-04-17 DIAGNOSIS — M5010 Cervical disc disorder with radiculopathy, unspecified cervical region: Secondary | ICD-10-CM | POA: Diagnosis not present

## 2024-04-17 DIAGNOSIS — M5412 Radiculopathy, cervical region: Secondary | ICD-10-CM

## 2024-04-17 DIAGNOSIS — M5030 Other cervical disc degeneration, unspecified cervical region: Secondary | ICD-10-CM

## 2024-04-17 DIAGNOSIS — M47812 Spondylosis without myelopathy or radiculopathy, cervical region: Secondary | ICD-10-CM

## 2024-04-18 ENCOUNTER — Ambulatory Visit

## 2024-04-18 ENCOUNTER — Ambulatory Visit: Admitting: Physical Therapy

## 2024-04-18 DIAGNOSIS — M6281 Muscle weakness (generalized): Secondary | ICD-10-CM

## 2024-04-18 DIAGNOSIS — M25511 Pain in right shoulder: Secondary | ICD-10-CM

## 2024-04-18 DIAGNOSIS — M79601 Pain in right arm: Secondary | ICD-10-CM

## 2024-04-18 DIAGNOSIS — M7541 Impingement syndrome of right shoulder: Secondary | ICD-10-CM | POA: Diagnosis not present

## 2024-04-18 DIAGNOSIS — M5412 Radiculopathy, cervical region: Secondary | ICD-10-CM

## 2024-04-18 NOTE — Progress Notes (Signed)
 Orthopaedic Surgery New Patient Visit   History of Present Illness: The patient is a 83 y.o. right-hand-dominant female seen in clinic for right shoulder pain.  Patient was referred by neurosurgery after having undergone evaluation of her cervical spine.  She reports pain in the right shoulder since mid October of this year.  She denies any significant trauma however does recall overexerting her right shoulder while doing yard work.  She describes tenderness about the shoulder diffusely with range of motion.  She also has trouble lifting objects secondary to pain.  She does also have difficulty laying on the right side at nighttime.  Symptoms are worsened with lifting or pushing motions in the shoulder.  She has tried several conservative treatment measures including Tylenol, prednisone  and physical therapy focused on her cervical spine.  She denies any previous history of right shoulder pain.  Denies associated symptoms including weakness, numbness or instability.  No other complaints today.     Past Medical, Social and Family History: Past Medical History:  Diagnosis Date   Breast cancer (HCC) 2000   RT MASTECTOMY   GERD (gastroesophageal reflux disease)    Hyperlipidemia    Personal history of chemotherapy 2000   BREAST CA   Personal history of radiation therapy 2000   BREAST CA   Past Surgical History:  Procedure Laterality Date   BREAST SURGERY     mastectomy, left breast    COLONOSCOPY WITH PROPOFOL  N/A 02/10/2022   Procedure: COLONOSCOPY WITH PROPOFOL ;  Surgeon: Toledo, Ladell POUR, MD;  Location: [facility] ENDOSCOPY;  Service: Gastroenterology;  Laterality: N/A;   MASTECTOMY Right 2000   BREAST CA   ROBOTIC ASSISTED TOTAL HYSTERECTOMY  06/16/2021   Allergies[1] Medications Ordered Prior to Encounter[2] Social History[3]    I have reviewed past medical, surgical, social and family history, medications and allergies as documented in the EMR.  Review of Systems - A ROS was  performed including pertinent positives and negatives as documented in the HPI.     Physical Exam:  General/Constitutional: NAD Vascular: No edema, swelling or tenderness, except as noted in detailed exam Integumentary: No impressive skin lesions present, except as noted in detailed exam Neuro/Psych: Normal mood and affect, oriented to person, place and time Musculoskeletal: Normal, except as noted in detailed exam and in HPI   Focused Orthopaedic Examination:   Shoulder focused exam:  Skin is intact about the right shoulder.  No obvious deformity or signs of trauma.  No ecchymosis or erythema.    RIGHT  Scapula Atrophy None   Winging None  Rotator cuff Supraspinatus 5/5   Infraspinatus 5/5   Subscapularis 5/5  AROM/PROM (degrees) FF 0-160 / 0-170   ER0 0-50 / 0-60   IR(back) T12 / T12  Palpation (pain): AC negative   Biceps negative   Coracoid negative  Special Tests: O'Briens negative   Cross body Adduction positive   Speeds  negative   Jobe's negative   Neer positive   Hawkins positive   Lateral deltoid 5/5    Vascular/Lymphatic: Fingers warm and well perfused with 2+ radial pulse.    Neurologic: Sensation intact to the Median, Ulnar and Radial nerve distribution of the hand. Sensation intact to lateral deltoid (axillary nerve).      XR right shoulder imaging: X-rays of the right shoulder including AP, Grashey, scapular Y, axillary views were obtained in office today and reviewed with patient.  Per my interpretation, there are no fractures or dislocations.  Mild degenerative changes noted about the glenohumeral  and acromioclavicular joints.  Mild downward sloping acromion.    Assessment:  Right shoulder impingement syndrome  Plan:  Patient was seen and examined in office today. We reviewed patient's history, examination, and imaging in detail. Based on information available for this encounter, patient is likely symptomatic from right shoulder impingement  syndrome.  Clinically, patient does have some end-range motion restriction in flexion and abduction.  She also does have pain with provocative maneuvers including Neer and Hawkins.  However, patient does demonstrate good rotator cuff strength overall.  Radiographically, there are no obvious concerns.  We discussed the natural course of her injury process and conservative treatment measures moving forward.  We discussed adding on shoulder focused physical therapy to her pre-existing cervical spine physical therapy.  She is interested in this option.  We also discussed right shoulder subacromial injection for diagnostic and therapeutic purposes.  She does not believe her symptoms warrant injection at this time.  She would like to try physical therapy first and consider injection at next visit if symptoms persist.  She will continue workup with neurosurgery as previously planned.  Plan to see her back in 8 weeks after the completion of physical therapy.  Patient education material was provided.  All questions, concerns and comments were addressed to the best of my ability.  Follow-up: 8 weeks, sooner if new or worsening symptoms.  I discussed with the patient today that I will be transitioning out of my role within the near future. In order to provide appropriate continuity of care, we offered the patient options for follow up regarding their orthopedic concerns. Patient has chosen to follow up with [facility].  Patient may reach out to our office if there are any difficulties in scheduling follow up care. The patient understands who to contact for future orthopedic concerns and has contact information for the receiving practice.    Arlyss GEANNIE Schneider, DO Orthopedic Surgery & Sports Medicine [place]   This document was dictated using Dragon voice recognition software. A reasonable attempt at proof reading has been made to minimize errors.     [1] No Known Allergies [2]  Current Outpatient  Medications on File Prior to Visit  Medication Sig Dispense Refill   aspirin 81 MG tablet Take 81 mg by mouth daily.     Cholecalciferol 25 MCG (1000 UT) tablet Take by mouth.     Docusate Sodium (DSS) 100 MG CAPS Take 2 capsules by mouth at bedtime.     lubiprostone  (AMITIZA ) 8 MCG capsule Take 1 capsule (8 mcg total) by mouth 2 (two) times daily with a meal. 60 capsule 2   Multiple Vitamin (MULTIVITAMIN) tablet Take 1 tablet by mouth daily.     simvastatin  (ZOCOR ) 40 MG tablet Take 1 tablet (40 mg total) by mouth every evening. 90 tablet 1   No current facility-administered medications on file prior to visit.  [3]  Social History Tobacco Use   Smoking status: Never   Smokeless tobacco: Never  Vaping Use   Vaping status: Never Used  Substance Use Topics   Alcohol use: Yes    Comment: OCC   Drug use: No

## 2024-04-18 NOTE — Patient Instructions (Signed)

## 2024-04-18 NOTE — Therapy (Signed)
 OUTPATIENT PHYSICAL THERAPY TREATMENT   Patient Name: Lori Caldwell MRN: [id number] DOB:11-24-1940, 83 y.o., female Today's Date: 04/18/2024  END OF SESSION:  PT End of Session - 04/18/24 0955     Visit Number 7    Number of Visits 13    Date for Recertification  05/02/24    Authorization Type Medicare A&B 2025    PT Start Time 0955    PT Stop Time 1032    PT Time Calculation (min) 37 min    Activity Tolerance Patient limited by pain    Behavior During Therapy [facility] for tasks assessed/performed           Past Medical History:  Diagnosis Date   Breast cancer (HCC) 2000   RT MASTECTOMY   GERD (gastroesophageal reflux disease)    Hyperlipidemia    Personal history of chemotherapy 2000   BREAST CA   Personal history of radiation therapy 2000   BREAST CA   Past Surgical History:  Procedure Laterality Date   BREAST SURGERY     mastectomy, left breast    COLONOSCOPY WITH PROPOFOL  N/A 02/10/2022   Procedure: COLONOSCOPY WITH PROPOFOL ;  Surgeon: Toledo, Ladell POUR, MD;  Location: [facility] ENDOSCOPY;  Service: Gastroenterology;  Laterality: N/A;   MASTECTOMY Right 2000   BREAST CA   ROBOTIC ASSISTED TOTAL HYSTERECTOMY  06/16/2021   Patient Active Problem List   Diagnosis Date Noted   Ecchymosis 10/02/2023   Pelvic mass in female 05/11/2021   Ovarian cyst 04/29/2021   Constipation 11/21/2020   Basal cell carcinoma of chin 06/24/2020   S/P right mastectomy 06/23/2020   Allergic rhinitis 07/28/2017   Colon cancer screening 03/14/2017   History of syncope 01/16/2015   Medicare annual wellness visit, subsequent 12/24/2012   Osteopenia due to cancer therapy 10/22/2011   Personal history of breast cancer 04/13/2011   Screening for cervical cancer 04/13/2011   Hyperlipidemia    GERD (gastroesophageal reflux disease)     PCP: Marylynn Verneita CROME, MD  REFERRING PROVIDER: Marylynn Verneita CROME, MD  REFERRING DIAG: [phone] (ICD-10-CM) - Cervical spondylitis with radiculitis    RATIONALE FOR EVALUATION AND TREATMENT: Rehabilitation  THERAPY DIAG: Radiculopathy, cervical region  Pain in right arm  Muscle weakness (generalized)  ONSET DATE: 02/16/24, began with R arm pain   FOLLOW-UP APPT SCHEDULED WITH REFERRING PROVIDER: No   PERTINENT HISTORY: Patient is an 83 year old female referred for upper quarter pain; R upper arm pain with referring diagnosis of cervical spondylitis with radiculitis. Pt had prednisone  taper and this notably improved pain. Pt is active and likes to exercise. Patient has f/u with neurosurgery 04/11/24. Pt felt that working in her yard let to onset of symptoms. Pt reports continuing with resistance training exercises at the time, and she feels that this may have aggravated it too. Patient reports difficulty with getting adequate sleep. Patient reports sleeping 3-4 hours, and then she's up and down through the remainder of the night. Pt is s/p R mastectomy 26 years ago. Pt reports pain in R upper arm. She reports pain has been worse in afternoon/evening generally.   Pain:  Pain Intensity: Present: 5/10, Best: 0/10, Worst: 8-10/10 Pain location: Upper arm/middle deltoid region Pain Quality: burning  Radiating: Yes ; R upper arm  Numbness/Tingling: Yes; affecting no specific digits, tingling into thenar region notably  Focal Weakness: Yes, harder to manipulate items with R hand Aggravating factors: pushing, pulling, knitting for prolonged period, pushing down for sit to stand Relieving factors: when walking/on the  move,  24-hour pain behavior: worse at night time History of prior neck injury, pain, surgery, or therapy: No  Dominant hand: right Imaging: Yes ;  CLINICAL DATA:  Neck pain   EXAM: DG CERVICAL SPINE COMPLETE 4+V   COMPARISON:  None Available.   FINDINGS: Frontal, bilateral oblique, lateral views of the cervical spine are obtained. Mild left convex cervical scoliosis. Otherwise alignment is anatomic. No acute displaced  fracture. Mild multilevel spondylosis and facet hypertrophy, with disc space narrowing most pronounced at the C5-6 and C6-7 levels. Evaluation of the neural foramina is limited by suboptimal positioning. Prevertebral soft tissues are unremarkable. Lung apices are clear.   IMPRESSION: 1. Mild multilevel cervical spondylosis and facet hypertrophy. 2. Mild left convex cervical scoliosis. 3. No acute displaced fracture.     Electronically Signed   By: Ozell Daring M.D.   On: 02/22/2024 21:17   Red flags (personal history of cancer, h/o spinal tumors, history of compression fracture, chills/fever, night sweats, nausea, vomiting, unrelenting pain): Positive for Hx of breast cancer   PRECAUTIONS: None  WEIGHT BEARING RESTRICTIONS: No  FALLS: Has patient fallen in last 6 months? No  Living Environment Lives with: lives with their spouse Lives in: House/apartment  Prior level of function: Independent  Occupational demands: Retired  Presenter, Broadcasting: Archivist, working in yard  Patient Goals: Sleep better, not hurt as much    OBJECTIVE (data from initial evaluation unless otherwise dated):   Patient Surveys  NDI:  NECK DISABILITY INDEX  Date: 03/21/24 Score  Pain intensity 2 = The pain is moderate at the moment  2. Personal care (washing, dressing, etc.) 1 =  I can look after myself normally but it causes extra pain  3. Lifting 2 = Pain prevents me lifting heavy weights off the floor, but I can manage if they are  conveniently placed, for example on a table  4. Reading 1 = I can read as much as I want to with slight pain in my neck  5. Headaches 0 = I have no headaches at all  6. Concentration 1 =  I can concentrate fully when I want to with slight difficulty   7. Work 3 =  I cannot do my usual work  8. Driving 1 =  I can drive my car as long as I want with slight pain in my neck  9. Sleeping 4 = My sleep is greatly disturbed (3-5 hrs sleepless)   10. Recreation 2 = I am able to  engage in most, but not all of my usual recreation activities because of   pain in my neck  Total 17/50    Posture Mild forward head, rounded shoulders  AROM Shoulder AROM  Flexion: R 83*, L 139  Abduction: R 45*, L 135  Functional ER: R T3, L T3  Functional IR:  R L2*, L T8   AROM (Normal range in degrees) AROM 03/21/24  Cervical  Flexion (50) WNL  Extension (80) 37  Right lateral flexion (45) 38  Left lateral flexion (45) 34  Right rotation (85) 70  Left rotation (85) 67  (* = pain; Blank rows = not tested)   MMT MMT (out of 5) Right 03/21/24 Left 03/21/24      Shoulder   Flexion 4-* 4  Extension    Abduction 3+* 4-  External rotation 4- 4-  Internal rotation    Horizontal abduction    Horizontal adduction    Lower Trapezius    Rhomboids  Elbow  Flexion 4+* 5  Extension 4+ 4+  Pronation    Supination        Wrist  Flexion 4+ 5  Extension 4* (upper arm) 5  Radial deviation    Ulnar deviation    (* = pain; Blank rows = not tested)  Sensation Grossly intact to light touch bilateral UE as determined by testing dermatomes C2-T2. Proprioception and hot/cold testing deferred on this date.  Reflexes R/L Elbow: Unable to obtain Brachioradialis: Unable to obtain/1+  Tricep: 1+/1+  Palpation Location LEFT  RIGHT           Suboccipitals  0  Cervical paraspinals 0 0  Upper Trapezius 0 0  Levator Scapulae 0 1  Rhomboid Major/Minor  1  (Blank rows = not tested) Graded on 0-4 scale (0 = no pain, 1 = pain, 2 = pain with wincing/grimacing/flinching, 3 = pain with withdrawal, 4 = unwilling to allow palpation), (Blank rows = not tested)  Repeated Movements Repeated cervical retraction: no effect during, no effect after; Repeated cervical retraction-extension: no effect during, slight decrease after  Passive Accessory Intervertebral Motion No pain with CPA or UPA. Hypomobile C4-7 CPA. Decreased R to L sideglide at C4-7.   SPECIAL TESTS Spurlings A  (ipsilateral lateral flexion/axial compression): R: Positive for referred R upper quarter/arm pain L: Negative Distraction Test: Positive for pain relief  Hoffman Sign (cervical cord compression): R: Negative L: Negative ULTT Median: R: Positive L: Negative ULTT Ulnar: R: Negative L: Negative ULTT Radial: R: Negative L: Negative  Update 04/16/24 Empty Can Test: R Positive, L Negative External rotation MMT: Negative for pain provocation  Drop Arm Test: Negative Painful Arc: Negative      TODAY'S TREATMENT    04/18/2024   SUBJECTIVE STATEMENT:   Pt reports getting fair sleep last night. Patient reports her symptoms are feeling relatively mild at arrival to PT. Pt reports 3/10 pain at arrival - no Tylenol/OTC medicine today. Pt does not think she's experienced notable paresthesias/distal arm pain. Pain is largely localized to R upper arm and is mild today. Pt is following up with orthopedist this afternoon to assess R shoulder/upper arm pain.    AROM Cervical flexion:  WNL Cervical extension: min motion loss Lateral flexion: Right WNL , Left WNL Cervical rotation: Right WNL, Left WNL *Indicates pain   Therapeutic Exercise - for improved soft tissue flexibility and extensibility as needed for ROM, improved strength as needed to improve performance of CKC activities/functional movements     Standing shoulder flexion AAROM with dowel; 2 x 10   PATIENT EDUCATION: HEP reviewed. Discussed potential overlay of musculoskeletal pain with largely localized R upper arm pain at this time versus radiating R upper limb symptoms or paresthesias. Encouraged continued repeated movement/HEP.    *not today*   Repeated retraction-extension with patient overpressure; 1 x 10    -reviewed technique and ensured pt performed swivel overpressure technique at end-range    -monitored for any adverse effects with cervical extension e.g. dizziness/faintness   Manual Therapy - for symptom modulation,  soft tissue sensitivity and mobility, joint mobility, ROM    Manual cervical traction in supine; 10 sec on, 10 sec off; x 8 minutes for nerve root decompression and symptom modulation  -minimal pain in lying after completion of traction, can feel mild sensation in R upper arm that is not painful afterward   Neuromuscular Re-education - for postural re-education, neural gliding/nerve mobilization for decreased sensitivity and improved neural/dural tissue mobility  Standing B shoulder ER with scapular retraction with Red TB 2 x 10   -back to wall for postural cue   Standing OH row; 2 x 12, 3 sec hold with Green Tband   -tactile and verbal cueing/demo for scap depression  Standing scaption with 1-lb Dbell; 1x10  -with mirror feedback for exercise technique/angle Standing scaption with 2-lb Dbell; 1x10  -with mirror feedback for exercise technique/angle   *not today* Standing with foam roll along spine vertically with B shoulder horizontal abduction with GTB 2 x 15  Standing shoulder shrugs with 3# 2x15 Passive median nerve flossing; 2 x 10 with conjunct cervical lateral flexion Median nerve glide, with arm propped on arm rest; 1 x 10, 1 sec hold   -for HEP review to improve tolerance with nerve flossing Seated scapular retraction; 2 x 10, 3 sec hold    PATIENT EDUCATION:  Education details: see above for patient education details Person educated: Patient Education method: Explanation, Demonstration, and Handouts Education comprehension: verbalized understanding and returned demonstration   HOME EXERCISE PROGRAM:  Access Code: [email or web address] URL: [email or web address] Date: 04/16/2024 Prepared by: Venetia Endo  Exercises - Seated Cervical Retraction and Extension  - 5-6 x daily - 7 x weekly - 1 sets - 10 reps - 1sec hold - Median Nerve Flossing - Tray (Mirrored)  - 2-3 x daily - 7 x weekly - 2 sets - 10 reps - 1sec hold - Seated Scapular Retraction  - 2-3 x  daily - 7 x weekly - 2 sets - 10 reps - 3sec hold - Supine Shoulder Flexion Extension AAROM with Dowel  - 2 x daily - 7 x weekly - 2 sets - 10 reps - Standing Shoulder Row with Anchored Resistance  - 1 x daily - 7 x weekly - 2 sets - 10 reps - 3sec hold - Shoulder External Rotation and Scapular Retraction with Resistance  - 1 x daily - 7 x weekly - 2 sets - 10 reps   ASSESSMENT:  CLINICAL IMPRESSION:   Pt had MRI yesterday and results were posted already with evidence of mild cervical spondylosis and R-sided facet hypertrophy of C5-6 and C6-7 with mild reactive bone marrow edema. No evidence of neural impingement was found at this time. We further examined for peripheral orthopedic issue contributing to R upper arm pain last visit. Pt does have apparent posterior cuff weakness and positive Empty Can testing. We have further progressed with specific exercise for RTC strengthening and periscapular strengthening. Pt is continuing with C-spine repeated movement program, as we have observed normalized C-spine AROM and no notable report of paresthesias/R upper limb paresthesias or distal upper limb pain recently.  Pt will continue to benefit from skilled PT services to address deficits and improve function.   OBJECTIVE IMPAIRMENTS: decreased ROM, decreased strength, hypomobility, impaired UE functional use, postural dysfunction, and pain.   ACTIVITY LIMITATIONS: carrying, lifting, sleeping, reach over head, and hygiene/grooming  PARTICIPATION LIMITATIONS: meal prep, cleaning, laundry, driving, shopping, and yard work  PERSONAL FACTORS: Age, Past/current experiences, and 3+ comorbidities: (Hx of breast cancer/R mastectomy, HLD, osteopenia) are also affecting patient's functional outcome.   REHAB POTENTIAL: Good  CLINICAL DECISION MAKING: Evolving/moderate complexity  EVALUATION COMPLEXITY: Moderate   GOALS: Goals reviewed with patient? Yes  SHORT TERM GOALS: Target date: 04/13/2024  Pt will  be independent with HEP to improve strength and decrease neck pain to improve pain-free function at home and work. Baseline: 03/21/24: Baseline repeated movement program initiated.  Goal status: INITIAL  LONG TERM GOALS: Target date: 05/02/2024  Pt will have access to full cervical spine AROM without reproduction of pain as needed for scanning environment, driving, and overhead activity Baseline: 03/21/24: Decreased C-spine extension and L>R rotation AROM.  Goal status: INITIAL  2.  Pt will decrease worst upper quarter pain by at least 3 points on the NPRS in order to demonstrate clinically significant reduction in neck pain. Baseline: 03/21/24: 8-10/10 at worst.  Goal status: INITIAL  3.  Pt will decrease NDI score by at least 19% in order demonstrate clinically significant reduction in neck pain/disability.       Baseline: 03/21/24: 17/50  Goal status: INITIAL  4.  Patient will have MMT 4+/5 or greater for deltoid and biceps without reproduction of symptoms indicative of improved strength and tolerance to load required for daily lifting and carrying tasks.  Baseline: 03/21/24: R shoulder flexion 4-, shoulder abduction 3+, biceps 4+ with pain reproduction.  Goal status: INITIAL   PLAN: PT FREQUENCY: 1-2x/week  PT DURATION: 6 weeks  PLANNED INTERVENTIONS: Therapeutic exercises, Therapeutic activity, Neuromuscular re-education, Balance training, Gait training, Patient/Family education, Self Care, Joint mobilization, Joint manipulation, Vestibular training, Canalith repositioning, Orthotic/Fit training, DME instructions, Dry Needling, Electrical stimulation, Spinal manipulation, Spinal mobilization, Cryotherapy, Moist heat, Taping, Traction, Ultrasound, Ionotophoresis 4mg /ml Dexamethasone , Manual therapy, and Re-evaluation.  PLAN FOR NEXT SESSION: Utilize traction prn for symptom modulation. Re-assess response with repeated movement program and modify movement as needed. Continue with  periscapular isometrics/isotonics. Neurodynamics. Continue with addition of posterior cuff strengthening and progressive upper limb PRE as tolerated.   Venetia Endo, PT, DPT [phone]  Physical Therapist - [facility]  Venetia ONEIDA Endo, [place] 04/18/2024, 11:00 AM

## 2024-04-19 ENCOUNTER — Other Ambulatory Visit: Payer: Self-pay | Admitting: Internal Medicine

## 2024-04-19 DIAGNOSIS — E042 Nontoxic multinodular goiter: Secondary | ICD-10-CM

## 2024-04-19 NOTE — Telephone Encounter (Signed)
 Cervical MRI dated 04/17/24:  FINDINGS:   BONES AND ALIGNMENT: Normal alignment. Normal vertebral body heights. Bone marrow signal is unremarkable. Benign hemangioma noted within the T1 vertebral body. No worrisome osseous lesions.   SPINAL CORD: Normal spinal cord size. No abnormal spinal cord signal.   SOFT TISSUES: No paraspinal mass. Mild edema within the right posterior paraspinal soft tissues adjacent to the right T6 to T7 facet, favored to be reactive in nature due to facet arthritis (series 108, image 1). Few left thyroid  nodules noted, largest of which measures 1.7 cm.   C2-C3: Unremarkable.   C3-C4: Minimal annular disc bulge. No spinal canal stenosis or neural foraminal narrowing.   C4-C5: Tiny central disc protrusion minimally indents ventral thecal sac. No spinal canal stenosis or neural foraminal narrowing.   C5-C6: Minimal disc bulge. Mild right-sided facet hypertrophy. No spinal canal stenosis or neural foraminal narrowing.   C6-C7: Intervertebral disc space narrowing. Mild diffuse disc bulge or uncovertebral spurring. Mild right-sided facet hypertrophy. No spinal canal stenosis. Neural foramina remain patent.   C7-T1: Normal interspace. Minimal facet spurring. No spinal canal stenosis or neural foraminal narrowing.   IMPRESSION: 1. Mild cervical spondylosis for age without significant stenosis or neural impingement. 2. Mild right-sided facet hypertrophy at C5-C6 and C6-c7 with . mild reactive marrow edema within the adjacent paraspinal soft tissues. Findings could contribute to neck pain. 3. Few left thyroid  nodules, largest measuring 1.7 cm; recommend non-emergent thyroid  ultrasound.   Electronically signed by: Morene Hoard MD 04/18/2024 07:06 AM EST RP Workstation: [id number]  She has f/u with me on 05/24/24 to review images. I tried to call her about thyroid  nodules and left VM that I sent her a MyChart message.

## 2024-04-23 ENCOUNTER — Ambulatory Visit: Admitting: Physical Therapy

## 2024-04-23 DIAGNOSIS — M5412 Radiculopathy, cervical region: Secondary | ICD-10-CM | POA: Diagnosis not present

## 2024-04-23 DIAGNOSIS — M79601 Pain in right arm: Secondary | ICD-10-CM

## 2024-04-23 DIAGNOSIS — M6281 Muscle weakness (generalized): Secondary | ICD-10-CM

## 2024-04-23 NOTE — Therapy (Signed)
 " OUTPATIENT PHYSICAL THERAPY TREATMENT   Patient Name: Lori Caldwell MRN: [id number] DOB:1940-05-24, 83 y.o., female Today's Date: 04/23/2024  END OF SESSION:  PT End of Session - 04/23/24 0900     Visit Number 8    Number of Visits 13    Date for Recertification  05/02/24    Authorization Type Medicare A&B 2025    PT Start Time 0907    PT Stop Time 0946    PT Time Calculation (min) 39 min    Activity Tolerance Patient limited by pain    Behavior During Therapy [facility] for tasks assessed/performed           Past Medical History:  Diagnosis Date   Breast cancer (HCC) 2000   RT MASTECTOMY   GERD (gastroesophageal reflux disease)    Hyperlipidemia    Personal history of chemotherapy 2000   BREAST CA   Personal history of radiation therapy 2000   BREAST CA   Past Surgical History:  Procedure Laterality Date   BREAST SURGERY     mastectomy, left breast    COLONOSCOPY WITH PROPOFOL  N/A 02/10/2022   Procedure: COLONOSCOPY WITH PROPOFOL ;  Surgeon: Toledo, Ladell POUR, MD;  Location: [facility] ENDOSCOPY;  Service: Gastroenterology;  Laterality: N/A;   MASTECTOMY Right 2000   BREAST CA   ROBOTIC ASSISTED TOTAL HYSTERECTOMY  06/16/2021   Patient Active Problem List   Diagnosis Date Noted   Ecchymosis 10/02/2023   Pelvic mass in female 05/11/2021   Ovarian cyst 04/29/2021   Constipation 11/21/2020   Basal cell carcinoma of chin 06/24/2020   S/P right mastectomy 06/23/2020   Allergic rhinitis 07/28/2017   Colon cancer screening 03/14/2017   History of syncope 01/16/2015   Medicare annual wellness visit, subsequent 12/24/2012   Osteopenia due to cancer therapy 10/22/2011   Personal history of breast cancer 04/13/2011   Screening for cervical cancer 04/13/2011   Hyperlipidemia    GERD (gastroesophageal reflux disease)     PCP: Marylynn Verneita CROME, MD  REFERRING PROVIDER: Marylynn Verneita CROME, MD  REFERRING DIAG: [phone] (ICD-10-CM) - Cervical spondylitis with radiculitis    RATIONALE FOR EVALUATION AND TREATMENT: Rehabilitation  THERAPY DIAG: Radiculopathy, cervical region  Pain in right arm  Muscle weakness (generalized)  ONSET DATE: 02/16/24, began with R arm pain   FOLLOW-UP APPT SCHEDULED WITH REFERRING PROVIDER: No   PERTINENT HISTORY: Patient is an 83 year old female referred for upper quarter pain; R upper arm pain with referring diagnosis of cervical spondylitis with radiculitis. Pt had prednisone  taper and this notably improved pain. Pt is active and likes to exercise. Patient has f/u with neurosurgery 04/11/24. Pt felt that working in her yard let to onset of symptoms. Pt reports continuing with resistance training exercises at the time, and she feels that this may have aggravated it too. Patient reports difficulty with getting adequate sleep. Patient reports sleeping 3-4 hours, and then she's up and down through the remainder of the night. Pt is s/p R mastectomy 26 years ago. Pt reports pain in R upper arm. She reports pain has been worse in afternoon/evening generally.   Pain:  Pain Intensity: Present: 5/10, Best: 0/10, Worst: 8-10/10 Pain location: Upper arm/middle deltoid region Pain Quality: burning  Radiating: Yes ; R upper arm  Numbness/Tingling: Yes; affecting no specific digits, tingling into thenar region notably  Focal Weakness: Yes, harder to manipulate items with R hand Aggravating factors: pushing, pulling, knitting for prolonged period, pushing down for sit to stand Relieving factors: when walking/on  the move,  24-hour pain behavior: worse at night time History of prior neck injury, pain, surgery, or therapy: No  Dominant hand: right Imaging: Yes ;  CLINICAL DATA:  Neck pain   EXAM: DG CERVICAL SPINE COMPLETE 4+V   COMPARISON:  None Available.   FINDINGS: Frontal, bilateral oblique, lateral views of the cervical spine are obtained. Mild left convex cervical scoliosis. Otherwise alignment is anatomic. No acute displaced  fracture. Mild multilevel spondylosis and facet hypertrophy, with disc space narrowing most pronounced at the C5-6 and C6-7 levels. Evaluation of the neural foramina is limited by suboptimal positioning. Prevertebral soft tissues are unremarkable. Lung apices are clear.   IMPRESSION: 1. Mild multilevel cervical spondylosis and facet hypertrophy. 2. Mild left convex cervical scoliosis. 3. No acute displaced fracture.     Electronically Signed   By: Ozell Daring M.D.   On: 02/22/2024 21:17   Red flags (personal history of cancer, h/o spinal tumors, history of compression fracture, chills/fever, night sweats, nausea, vomiting, unrelenting pain): Positive for Hx of breast cancer   PRECAUTIONS: None  WEIGHT BEARING RESTRICTIONS: No  FALLS: Has patient fallen in last 6 months? No  Living Environment Lives with: lives with their spouse Lives in: House/apartment  Prior level of function: Independent  Occupational demands: Retired  Presenter, Broadcasting: Archivist, working in yard  Patient Goals: Sleep better, not hurt as much    OBJECTIVE (data from initial evaluation unless otherwise dated):   Patient Surveys  NDI:  NECK DISABILITY INDEX  Date: 03/21/24 Score  Pain intensity 2 = The pain is moderate at the moment  2. Personal care (washing, dressing, etc.) 1 =  I can look after myself normally but it causes extra pain  3. Lifting 2 = Pain prevents me lifting heavy weights off the floor, but I can manage if they are  conveniently placed, for example on a table  4. Reading 1 = I can read as much as I want to with slight pain in my neck  5. Headaches 0 = I have no headaches at all  6. Concentration 1 =  I can concentrate fully when I want to with slight difficulty   7. Work 3 =  I cannot do my usual work  8. Driving 1 =  I can drive my car as long as I want with slight pain in my neck  9. Sleeping 4 = My sleep is greatly disturbed (3-5 hrs sleepless)   10. Recreation 2 = I am able to  engage in most, but not all of my usual recreation activities because of   pain in my neck  Total 17/50    Posture Mild forward head, rounded shoulders  AROM Shoulder AROM  Flexion: R 83*, L 139  Abduction: R 45*, L 135  Functional ER: R T3, L T3  Functional IR:  R L2*, L T8   AROM (Normal range in degrees) AROM 03/21/24  Cervical  Flexion (50) WNL  Extension (80) 37  Right lateral flexion (45) 38  Left lateral flexion (45) 34  Right rotation (85) 70  Left rotation (85) 67  (* = pain; Blank rows = not tested)   MMT MMT (out of 5) Right 03/21/24 Left 03/21/24      Shoulder   Flexion 4-* 4  Extension    Abduction 3+* 4-  External rotation 4- 4-  Internal rotation    Horizontal abduction    Horizontal adduction    Lower Trapezius    Rhomboids  Elbow  Flexion 4+* 5  Extension 4+ 4+  Pronation    Supination        Wrist  Flexion 4+ 5  Extension 4* (upper arm) 5  Radial deviation    Ulnar deviation    (* = pain; Blank rows = not tested)  Sensation Grossly intact to light touch bilateral UE as determined by testing dermatomes C2-T2. Proprioception and hot/cold testing deferred on this date.  Reflexes R/L Elbow: Unable to obtain Brachioradialis: Unable to obtain/1+  Tricep: 1+/1+  Palpation Location LEFT  RIGHT           Suboccipitals  0  Cervical paraspinals 0 0  Upper Trapezius 0 0  Levator Scapulae 0 1  Rhomboid Major/Minor  1  (Blank rows = not tested) Graded on 0-4 scale (0 = no pain, 1 = pain, 2 = pain with wincing/grimacing/flinching, 3 = pain with withdrawal, 4 = unwilling to allow palpation), (Blank rows = not tested)  Repeated Movements Repeated cervical retraction: no effect during, no effect after; Repeated cervical retraction-extension: no effect during, slight decrease after  Passive Accessory Intervertebral Motion No pain with CPA or UPA. Hypomobile C4-7 CPA. Decreased R to L sideglide at C4-7.   SPECIAL TESTS Spurlings A  (ipsilateral lateral flexion/axial compression): R: Positive for referred R upper quarter/arm pain L: Negative Distraction Test: Positive for pain relief  Hoffman Sign (cervical cord compression): R: Negative L: Negative ULTT Median: R: Positive L: Negative ULTT Ulnar: R: Negative L: Negative ULTT Radial: R: Negative L: Negative  Update 04/16/24 Empty Can Test: R Positive, L Negative External rotation MMT: Negative for pain provocation  Drop Arm Test: Negative Painful Arc: Negative      TODAY'S TREATMENT    04/23/2024   SUBJECTIVE STATEMENT:   Pt had f/u with orthopedics and had good X-rays for R shoulder. Pt had cervical spine MRI results with some thyroid  nodules noted - ultrasound recommended. Patient reports <5/10 pain at arrival to PT. Pain along R lateral deltoid/upper arm and upper trap region at arrival to PT. Pt reports no notable pain all day yesterday. She has her usual pain affecting R upper arm/R shoulder today. She reports no recent paresthesias.    AROM Cervical flexion:  WNL Cervical extension: min motion loss Lateral flexion: Right WNL , Left WNL Cervical rotation: Right WNL, Left WNL *Indicates pain  Shoulder Flexion:  Flexion: 130 bilat (mild pain R side*) Abduction: R 120*, L 130   Therapeutic Exercise - for improved soft tissue flexibility and extensibility as needed for ROM, improved strength as needed to improve performance of CKC activities/functional movements     Standing shoulder flexion AAROM with dowel; 2 x 10    Standing ER with Red Tband, towel under arm; 2 x 10   PATIENT EDUCATION: HEP reviewed. Discussed potential overlay of musculoskeletal pain with largely localized R upper arm pain at this time versus radiating R upper limb symptoms or paresthesias. Encouraged continued repeated movement/HEP.    *not today*   Repeated retraction-extension with patient overpressure; 1 x 10    -reviewed technique and ensured pt performed swivel  overpressure technique at end-range    -monitored for any adverse effects with cervical extension e.g. dizziness/faintness   Manual Therapy - for symptom modulation, soft tissue sensitivity and mobility, joint mobility, ROM   R shoulder PROM within pt tolerance (flexion, ABD, ER, IR); x 2 min  Glenohumeral mobilization, inferior and posterior, 2 x 30 sec ea dir   *not today* Manual  cervical traction in supine; 10 sec on, 10 sec off; x 8 minutes for nerve root decompression and symptom modulation     Neuromuscular Re-education - for postural re-education, neural gliding/nerve mobilization for decreased sensitivity and improved neural/dural tissue mobility       Standing OH row; 2 x 12, 3 sec hold with Green Tband   -tactile and verbal cueing/demo for scap depression    Standing B shoulder ER with scapular retraction with Red TB 2 x 10   -back to wall for postural cue  Standing scaption with 2-lb Dbells; 1x10  -with mirror feedback for exercise technique/angle   *not today* Standing with foam roll along spine vertically with B shoulder horizontal abduction with GTB 2 x 15  Standing shoulder shrugs with 3# 2x15 Passive median nerve flossing; 2 x 10 with conjunct cervical lateral flexion Median nerve glide, with arm propped on arm rest; 1 x 10, 1 sec hold   -for HEP review to improve tolerance with nerve flossing Seated scapular retraction; 2 x 10, 3 sec hold    PATIENT EDUCATION:  Education details: see above for patient education details Person educated: Patient Education method: Explanation, Demonstration, and Handouts Education comprehension: verbalized understanding and returned demonstration   HOME EXERCISE PROGRAM:  Access Code: [email or web address] URL: [email or web address] Date: 04/16/2024 Prepared by: Venetia Endo  Exercises - Seated Cervical Retraction and Extension  - 5-6 x daily - 7 x weekly - 1 sets - 10 reps - 1sec hold - Median Nerve Flossing -  Tray (Mirrored)  - 2-3 x daily - 7 x weekly - 2 sets - 10 reps - 1sec hold - Seated Scapular Retraction  - 2-3 x daily - 7 x weekly - 2 sets - 10 reps - 3sec hold - Supine Shoulder Flexion Extension AAROM with Dowel  - 2 x daily - 7 x weekly - 2 sets - 10 reps - Standing Shoulder Row with Anchored Resistance  - 1 x daily - 7 x weekly - 2 sets - 10 reps - 3sec hold - Shoulder External Rotation and Scapular Retraction with Resistance  - 1 x daily - 7 x weekly - 2 sets - 10 reps   ASSESSMENT:  CLINICAL IMPRESSION:   Patient fortunately has normal cervical spine AROM without reproduction of symptoms and no recent paresthesias per report. She has persistent R upper arm pain with potential overlay of peripheral musculoskeletal pain from RTC. Pt has new PT referral for RTC tendonitis from [facility]. We further progressed with isotonics for posterior cuff and modestly progressed PRE today; pt to continue with C-spine repeated movement, stretching, and neurodynamics at home. Pt will continue to benefit from skilled PT services to address deficits and improve function.   OBJECTIVE IMPAIRMENTS: decreased ROM, decreased strength, hypomobility, impaired UE functional use, postural dysfunction, and pain.   ACTIVITY LIMITATIONS: carrying, lifting, sleeping, reach over head, and hygiene/grooming  PARTICIPATION LIMITATIONS: meal prep, cleaning, laundry, driving, shopping, and yard work  PERSONAL FACTORS: Age, Past/current experiences, and 3+ comorbidities: (Hx of breast cancer/R mastectomy, HLD, osteopenia) are also affecting patient's functional outcome.   REHAB POTENTIAL: Good  CLINICAL DECISION MAKING: Evolving/moderate complexity  EVALUATION COMPLEXITY: Moderate   GOALS: Goals reviewed with patient? Yes  SHORT TERM GOALS: Target date: 04/13/2024  Pt will be independent with HEP to improve strength and decrease neck pain to improve pain-free function at home and work. Baseline:  03/21/24: Baseline repeated movement program initiated.  Goal status: INITIAL   LONG TERM GOALS: Target  date: 05/02/2024  Pt will have access to full cervical spine AROM without reproduction of pain as needed for scanning environment, driving, and overhead activity Baseline: 03/21/24: Decreased C-spine extension and L>R rotation AROM.  Goal status: INITIAL  2.  Pt will decrease worst upper quarter pain by at least 3 points on the NPRS in order to demonstrate clinically significant reduction in neck pain. Baseline: 03/21/24: 8-10/10 at worst.  Goal status: INITIAL  3.  Pt will decrease NDI score by at least 19% in order demonstrate clinically significant reduction in neck pain/disability.       Baseline: 03/21/24: 17/50  Goal status: INITIAL  4.  Patient will have MMT 4+/5 or greater for deltoid and biceps without reproduction of symptoms indicative of improved strength and tolerance to load required for daily lifting and carrying tasks.  Baseline: 03/21/24: R shoulder flexion 4-, shoulder abduction 3+, biceps 4+ with pain reproduction.  Goal status: INITIAL   PLAN: PT FREQUENCY: 1-2x/week  PT DURATION: 6 weeks  PLANNED INTERVENTIONS: Therapeutic exercises, Therapeutic activity, Neuromuscular re-education, Balance training, Gait training, Patient/Family education, Self Care, Joint mobilization, Joint manipulation, Vestibular training, Canalith repositioning, Orthotic/Fit training, DME instructions, Dry Needling, Electrical stimulation, Spinal manipulation, Spinal mobilization, Cryotherapy, Moist heat, Taping, Traction, Ultrasound, Ionotophoresis 4mg /ml Dexamethasone , Manual therapy, and Re-evaluation.  PLAN FOR NEXT SESSION: Continue with periscapular isometrics/isotonics. Neurodynamics. Continue with addition of posterior cuff strengthening and progressive upper limb PRE as tolerated.   Venetia Endo, PT, DPT [phone]  Physical Therapist - [facility]  Venetia ONEIDA Endo, [place] 04/23/2024, 9:07 AM  "

## 2024-04-24 ENCOUNTER — Telehealth: Payer: Self-pay

## 2024-04-24 NOTE — Telephone Encounter (Signed)
 Is it okay to scheduled for next next Wednesday the 31st for review of the US  results?

## 2024-04-24 NOTE — Telephone Encounter (Signed)
-----   Message from Lori Kettering, MD sent at 04/19/2024  9:31 PM EST ----- Thanks for letting me know .  I have ordered it and will ask my nurse to schedule a follow up appt with me o review the results ----- Message ----- From: Lori Caldwell, DEVONNA Sent: 04/19/2024   9:06 PM EST To: Lori LITTIE Kettering, MD  Hi Dr. Kettering,   I am seeing Lori Caldwell for her neck and her cervical MRI showed a few left thyroid  nodules, largest measuring 1.7 cm. Radiology recommended a non-emergent thyroid  ultrasound.  Patient is aware.   Do you want me to order the US  and have follow up with you or would you prefer to order it? Either way is fine with me.   I hope you are doing well and have a wonderful holiday!  Caldwell

## 2024-04-27 ENCOUNTER — Ambulatory Visit
Admission: RE | Admit: 2024-04-27 | Discharge: 2024-04-27 | Disposition: A | Discharge status: Home / Self Care | Source: Ambulatory Visit | Attending: Internal Medicine | Admitting: Internal Medicine

## 2024-04-27 DIAGNOSIS — E042 Nontoxic multinodular goiter: Secondary | ICD-10-CM | POA: Insufficient documentation

## 2024-04-27 NOTE — Telephone Encounter (Signed)
 Pt has been scheduled for 05/09/2024.

## 2024-04-30 ENCOUNTER — Ambulatory Visit: Admitting: Physical Therapy

## 2024-04-30 DIAGNOSIS — M5412 Radiculopathy, cervical region: Secondary | ICD-10-CM | POA: Diagnosis not present

## 2024-04-30 DIAGNOSIS — M79601 Pain in right arm: Secondary | ICD-10-CM

## 2024-04-30 DIAGNOSIS — M6281 Muscle weakness (generalized): Secondary | ICD-10-CM

## 2024-04-30 NOTE — Therapy (Signed)
 " OUTPATIENT PHYSICAL THERAPY TREATMENT   Patient Name: Lori Caldwell MRN: [id number] DOB:11-06-1940, 83 y.o., female Today's Date: 04/30/2024  END OF SESSION:  PT End of Session - 04/30/24 1239     Visit Number 9    Number of Visits 13    Date for Recertification  05/02/24    Authorization Type Medicare A&B 2025    PT Start Time 1244    PT Stop Time 1328    PT Time Calculation (min) 44 min    Activity Tolerance Patient limited by pain    Behavior During Therapy [facility] for tasks assessed/performed            Past Medical History:  Diagnosis Date   Breast cancer (HCC) 2000   RT MASTECTOMY   GERD (gastroesophageal reflux disease)    Hyperlipidemia    Personal history of chemotherapy 2000   BREAST CA   Personal history of radiation therapy 2000   BREAST CA   Past Surgical History:  Procedure Laterality Date   BREAST SURGERY     mastectomy, left breast    COLONOSCOPY WITH PROPOFOL  N/A 02/10/2022   Procedure: COLONOSCOPY WITH PROPOFOL ;  Surgeon: Toledo, Ladell POUR, MD;  Location: [facility] ENDOSCOPY;  Service: Gastroenterology;  Laterality: N/A;   MASTECTOMY Right 2000   BREAST CA   ROBOTIC ASSISTED TOTAL HYSTERECTOMY  06/16/2021   Patient Active Problem List   Diagnosis Date Noted   Ecchymosis 10/02/2023   Pelvic mass in female 05/11/2021   Ovarian cyst 04/29/2021   Constipation 11/21/2020   Basal cell carcinoma of chin 06/24/2020   S/P right mastectomy 06/23/2020   Allergic rhinitis 07/28/2017   Colon cancer screening 03/14/2017   History of syncope 01/16/2015   Medicare annual wellness visit, subsequent 12/24/2012   Osteopenia due to cancer therapy 10/22/2011   Personal history of breast cancer 04/13/2011   Screening for cervical cancer 04/13/2011   Hyperlipidemia    GERD (gastroesophageal reflux disease)     PCP: Marylynn Verneita CROME, MD  REFERRING PROVIDER: Marylynn Verneita CROME, MD  REFERRING DIAG: [phone] (ICD-10-CM) - Cervical spondylitis with radiculitis    RATIONALE FOR EVALUATION AND TREATMENT: Rehabilitation  THERAPY DIAG: Radiculopathy, cervical region  Pain in right arm  Muscle weakness (generalized)  ONSET DATE: 02/16/24, began with R arm pain   FOLLOW-UP APPT SCHEDULED WITH REFERRING PROVIDER: No   PERTINENT HISTORY: Patient is an 83 year old female referred for upper quarter pain; R upper arm pain with referring diagnosis of cervical spondylitis with radiculitis. Pt had prednisone  taper and this notably improved pain. Pt is active and likes to exercise. Patient has f/u with neurosurgery 04/11/24. Pt felt that working in her yard let to onset of symptoms. Pt reports continuing with resistance training exercises at the time, and she feels that this may have aggravated it too. Patient reports difficulty with getting adequate sleep. Patient reports sleeping 3-4 hours, and then she's up and down through the remainder of the night. Pt is s/p R mastectomy 26 years ago. Pt reports pain in R upper arm. She reports pain has been worse in afternoon/evening generally.   Pain:  Pain Intensity: Present: 5/10, Best: 0/10, Worst: 8-10/10 Pain location: Upper arm/middle deltoid region Pain Quality: burning  Radiating: Yes ; R upper arm  Numbness/Tingling: Yes; affecting no specific digits, tingling into thenar region notably  Focal Weakness: Yes, harder to manipulate items with R hand Aggravating factors: pushing, pulling, knitting for prolonged period, pushing down for sit to stand Relieving factors: when  walking/on the move,  24-hour pain behavior: worse at night time History of prior neck injury, pain, surgery, or therapy: No  Dominant hand: right Imaging: Yes ;  CLINICAL DATA:  Neck pain   EXAM: DG CERVICAL SPINE COMPLETE 4+V   COMPARISON:  None Available.   FINDINGS: Frontal, bilateral oblique, lateral views of the cervical spine are obtained. Mild left convex cervical scoliosis. Otherwise alignment is anatomic. No acute displaced  fracture. Mild multilevel spondylosis and facet hypertrophy, with disc space narrowing most pronounced at the C5-6 and C6-7 levels. Evaluation of the neural foramina is limited by suboptimal positioning. Prevertebral soft tissues are unremarkable. Lung apices are clear.   IMPRESSION: 1. Mild multilevel cervical spondylosis and facet hypertrophy. 2. Mild left convex cervical scoliosis. 3. No acute displaced fracture.     Electronically Signed   By: Ozell Daring M.D.   On: 02/22/2024 21:17   Red flags (personal history of cancer, h/o spinal tumors, history of compression fracture, chills/fever, night sweats, nausea, vomiting, unrelenting pain): Positive for Hx of breast cancer   PRECAUTIONS: None  WEIGHT BEARING RESTRICTIONS: No  FALLS: Has patient fallen in last 6 months? No  Living Environment Lives with: lives with their spouse Lives in: House/apartment  Prior level of function: Independent  Occupational demands: Retired  Presenter, Broadcasting: Archivist, working in yard  Patient Goals: Sleep better, not hurt as much    OBJECTIVE (data from initial evaluation unless otherwise dated):   Patient Surveys  NDI:  NECK DISABILITY INDEX  Date: 03/21/24 Score  Pain intensity 2 = The pain is moderate at the moment  2. Personal care (washing, dressing, etc.) 1 =  I can look after myself normally but it causes extra pain  3. Lifting 2 = Pain prevents me lifting heavy weights off the floor, but I can manage if they are  conveniently placed, for example on a table  4. Reading 1 = I can read as much as I want to with slight pain in my neck  5. Headaches 0 = I have no headaches at all  6. Concentration 1 =  I can concentrate fully when I want to with slight difficulty   7. Work 3 =  I cannot do my usual work  8. Driving 1 =  I can drive my car as long as I want with slight pain in my neck  9. Sleeping 4 = My sleep is greatly disturbed (3-5 hrs sleepless)   10. Recreation 2 = I am able to  engage in most, but not all of my usual recreation activities because of   pain in my neck  Total 17/50    Posture Mild forward head, rounded shoulders  AROM Shoulder AROM  Flexion: R 83*, L 139  Abduction: R 45*, L 135  Functional ER: R T3, L T3  Functional IR:  R L2*, L T8   AROM (Normal range in degrees) AROM 03/21/24  Cervical  Flexion (50) WNL  Extension (80) 37  Right lateral flexion (45) 38  Left lateral flexion (45) 34  Right rotation (85) 70  Left rotation (85) 67  (* = pain; Blank rows = not tested)   MMT MMT (out of 5) Right 03/21/24 Left 03/21/24      Shoulder   Flexion 4-* 4  Extension    Abduction 3+* 4-  External rotation 4- 4-  Internal rotation    Horizontal abduction    Horizontal adduction    Lower Trapezius    Rhomboids  Elbow  Flexion 4+* 5  Extension 4+ 4+  Pronation    Supination        Wrist  Flexion 4+ 5  Extension 4* (upper arm) 5  Radial deviation    Ulnar deviation    (* = pain; Blank rows = not tested)  Sensation Grossly intact to light touch bilateral UE as determined by testing dermatomes C2-T2. Proprioception and hot/cold testing deferred on this date.  Reflexes R/L Elbow: Unable to obtain Brachioradialis: Unable to obtain/1+  Tricep: 1+/1+  Palpation Location LEFT  RIGHT           Suboccipitals  0  Cervical paraspinals 0 0  Upper Trapezius 0 0  Levator Scapulae 0 1  Rhomboid Major/Minor  1  (Blank rows = not tested) Graded on 0-4 scale (0 = no pain, 1 = pain, 2 = pain with wincing/grimacing/flinching, 3 = pain with withdrawal, 4 = unwilling to allow palpation), (Blank rows = not tested)  Repeated Movements Repeated cervical retraction: no effect during, no effect after; Repeated cervical retraction-extension: no effect during, slight decrease after  Passive Accessory Intervertebral Motion No pain with CPA or UPA. Hypomobile C4-7 CPA. Decreased R to L sideglide at C4-7.   SPECIAL TESTS Spurlings A  (ipsilateral lateral flexion/axial compression): R: Positive for referred R upper quarter/arm pain L: Negative Distraction Test: Positive for pain relief  Hoffman Sign (cervical cord compression): R: Negative L: Negative ULTT Median: R: Positive L: Negative ULTT Ulnar: R: Negative L: Negative ULTT Radial: R: Negative L: Negative  Update 04/16/24 Empty Can Test: R Positive, L Negative External rotation MMT: Negative for pain provocation  Drop Arm Test: Negative Painful Arc: Negative      TODAY'S TREATMENT    04/30/2024   SUBJECTIVE STATEMENT:   Pt reports some continuous R upper arm discomfort that is about the same. Pt reports doing better with lying on stomach and on side at night. Patient reports 3-4/10 pain at arrival. Pt reports no recent paresthesias or numbness. She reports some R paracervical mm tightness.    Therapeutic Exercise - for improved soft tissue flexibility and extensibility as needed for ROM, improved strength as needed to improve performance of CKC activities/functional movements   Upper body ergometer, 2 minutes forward, 2 minutes backward - for tissue warm-up to improve muscle performance, improved soft tissue mobility/extensibility - interval subjective gathered during this time    Standing shoulder flexion AAROM with dowel; 2 x 10    Standing ER with Red Tband, towel under arm; 2 x 10   PATIENT EDUCATION: HEP reviewed.   *not today*   Repeated retraction-extension with patient overpressure; 1 x 10    -reviewed technique and ensured pt performed swivel overpressure technique at end-range    -monitored for any adverse effects with cervical extension e.g. dizziness/faintness   Manual Therapy - for symptom modulation, soft tissue sensitivity and mobility, joint mobility, ROM     Manual cervical traction in supine; 10 sec on, 10 sec off; x 8 minutes for nerve root decompression and symptom modulation     STM R splenius cervicis/capitis; x 4  minutes   *not today* Glenohumeral mobilization, inferior and posterior, 2 x 30 sec ea dir R shoulder PROM within pt tolerance (flexion, ABD, ER, IR); x 2 min   Neuromuscular Re-education - for postural re-education, neural gliding/nerve mobilization for decreased sensitivity and improved neural/dural tissue mobility      Serratus slide with foam roller; 2 x 10   Standing OH row;  2 x 10, 3 sec hold with Green Tband   -tactile and verbal cueing/demo for scap depression  Standing scaption with 2-lb Dbells; 2x8  -with mirror feedback for exercise technique/angle   *not today* Standing B shoulder ER with scapular retraction with Red TB 2 x 10   -back to wall for postural cue Standing with foam roll along spine vertically with B shoulder horizontal abduction with GTB 2 x 15  Standing shoulder shrugs with 3# 2x15 Passive median nerve flossing; 2 x 10 with conjunct cervical lateral flexion Median nerve glide, with arm propped on arm rest; 1 x 10, 1 sec hold   -for HEP review to improve tolerance with nerve flossing Seated scapular retraction; 2 x 10, 3 sec hold    PATIENT EDUCATION:  Education details: see above for patient education details Person educated: Patient Education method: Explanation, Demonstration, and Handouts Education comprehension: verbalized understanding and returned demonstration   HOME EXERCISE PROGRAM:  Access Code: [email or web address] URL: [email or web address] Date: 04/16/2024 Prepared by: Venetia Endo  Exercises - Seated Cervical Retraction and Extension  - 5-6 x daily - 7 x weekly - 1 sets - 10 reps - 1sec hold - Median Nerve Flossing - Tray (Mirrored)  - 2-3 x daily - 7 x weekly - 2 sets - 10 reps - 1sec hold - Seated Scapular Retraction  - 2-3 x daily - 7 x weekly - 2 sets - 10 reps - 3sec hold - Supine Shoulder Flexion Extension AAROM with Dowel  - 2 x daily - 7 x weekly - 2 sets - 10 reps - Standing Shoulder Row with Anchored Resistance   - 1 x daily - 7 x weekly - 2 sets - 10 reps - 3sec hold - Shoulder External Rotation and Scapular Retraction with Resistance  - 1 x daily - 7 x weekly - 2 sets - 10 reps   ASSESSMENT:  CLINICAL IMPRESSION:   Patient reports some recent tightness affecting R cervical paraspinals, but fortunately no shooting/lancinating or paresthesias affecting R upper limb. She has continuous pain in R upper arm/lateral deltoid region that does not notably change with techniques and exercise c cervical spine. Pt exhibits mild deficit with R shoulder elevation versus LUE. We further progressed with isotonics for posterior cuff and modestly progressed PRE today; pt to continue with C-spine repeated movement, stretching, and neurodynamics at home. Pt will continue to benefit from skilled PT services to address deficits and improve function.   OBJECTIVE IMPAIRMENTS: decreased ROM, decreased strength, hypomobility, impaired UE functional use, postural dysfunction, and pain.   ACTIVITY LIMITATIONS: carrying, lifting, sleeping, reach over head, and hygiene/grooming  PARTICIPATION LIMITATIONS: meal prep, cleaning, laundry, driving, shopping, and yard work  PERSONAL FACTORS: Age, Past/current experiences, and 3+ comorbidities: (Hx of breast cancer/R mastectomy, HLD, osteopenia) are also affecting patient's functional outcome.   REHAB POTENTIAL: Good  CLINICAL DECISION MAKING: Evolving/moderate complexity  EVALUATION COMPLEXITY: Moderate   GOALS: Goals reviewed with patient? Yes  SHORT TERM GOALS: Target date: 04/13/2024  Pt will be independent with HEP to improve strength and decrease neck pain to improve pain-free function at home and work. Baseline: 03/21/24: Baseline repeated movement program initiated.  Goal status: INITIAL   LONG TERM GOALS: Target date: 05/02/2024  Pt will have access to full cervical spine AROM without reproduction of pain as needed for scanning environment, driving, and overhead  activity Baseline: 03/21/24: Decreased C-spine extension and L>R rotation AROM.  Goal status: INITIAL  2.  Pt will decrease worst upper quarter  pain by at least 3 points on the NPRS in order to demonstrate clinically significant reduction in neck pain. Baseline: 03/21/24: 8-10/10 at worst.  Goal status: INITIAL  3.  Pt will decrease NDI score by at least 19% in order demonstrate clinically significant reduction in neck pain/disability.       Baseline: 03/21/24: 17/50  Goal status: INITIAL  4.  Patient will have MMT 4+/5 or greater for deltoid and biceps without reproduction of symptoms indicative of improved strength and tolerance to load required for daily lifting and carrying tasks.  Baseline: 03/21/24: R shoulder flexion 4-, shoulder abduction 3+, biceps 4+ with pain reproduction.  Goal status: INITIAL   PLAN: PT FREQUENCY: 1-2x/week  PT DURATION: 6 weeks  PLANNED INTERVENTIONS: Therapeutic exercises, Therapeutic activity, Neuromuscular re-education, Balance training, Gait training, Patient/Family education, Self Care, Joint mobilization, Joint manipulation, Vestibular training, Canalith repositioning, Orthotic/Fit training, DME instructions, Dry Needling, Electrical stimulation, Spinal manipulation, Spinal mobilization, Cryotherapy, Moist heat, Taping, Traction, Ultrasound, Ionotophoresis 4mg /ml Dexamethasone , Manual therapy, and Re-evaluation.  PLAN FOR NEXT SESSION: Continue with periscapular isometrics/isotonics. Neurodynamics. Continue with addition of posterior cuff strengthening and progressive upper limb PRE as tolerated.   Venetia Endo, PT, DPT [phone]  Physical Therapist - [facility]  Venetia ONEIDA Endo, [place] 04/30/2024, 1:28 PM  "

## 2024-05-01 ENCOUNTER — Ambulatory Visit: Payer: Self-pay | Admitting: Internal Medicine

## 2024-05-01 DIAGNOSIS — E041 Nontoxic single thyroid nodule: Secondary | ICD-10-CM

## 2024-05-02 ENCOUNTER — Ambulatory Visit: Admitting: Physical Therapy

## 2024-05-07 ENCOUNTER — Encounter: Payer: Self-pay | Admitting: Physical Therapy

## 2024-05-07 ENCOUNTER — Ambulatory Visit: Attending: Internal Medicine

## 2024-05-07 DIAGNOSIS — M79601 Pain in right arm: Secondary | ICD-10-CM | POA: Diagnosis present

## 2024-05-07 DIAGNOSIS — M5412 Radiculopathy, cervical region: Secondary | ICD-10-CM | POA: Diagnosis present

## 2024-05-07 DIAGNOSIS — M6281 Muscle weakness (generalized): Secondary | ICD-10-CM | POA: Diagnosis present

## 2024-05-07 NOTE — Therapy (Signed)
 " OUTPATIENT PHYSICAL THERAPY DISCHARGE  Patient Name: Lori Caldwell MRN: [id number] DOB:30-May-1940, 84 y.o., female Today's Date: 05/07/2024  END OF SESSION:  PT End of Session - 05/07/24 1240     Visit Number 10    Number of Visits 13    Date for Recertification  05/02/24    Authorization Type Medicare A&B 2025    PT Start Time 1245    PT Stop Time 1326    PT Time Calculation (min) 41 min    Activity Tolerance Patient limited by pain    Behavior During Therapy [facility] for tasks assessed/performed             Past Medical History:  Diagnosis Date   Breast cancer (HCC) 2000   RT MASTECTOMY   GERD (gastroesophageal reflux disease)    Hyperlipidemia    Personal history of chemotherapy 2000   BREAST CA   Personal history of radiation therapy 2000   BREAST CA   Past Surgical History:  Procedure Laterality Date   BREAST SURGERY     mastectomy, left breast    COLONOSCOPY WITH PROPOFOL  N/A 02/10/2022   Procedure: COLONOSCOPY WITH PROPOFOL ;  Surgeon: Toledo, Ladell POUR, MD;  Location: [facility] ENDOSCOPY;  Service: Gastroenterology;  Laterality: N/A;   MASTECTOMY Right 2000   BREAST CA   ROBOTIC ASSISTED TOTAL HYSTERECTOMY  06/16/2021   Patient Active Problem List   Diagnosis Date Noted   Ecchymosis 10/02/2023   Pelvic mass in female 05/11/2021   Ovarian cyst 04/29/2021   Constipation 11/21/2020   Basal cell carcinoma of chin 06/24/2020   S/P right mastectomy 06/23/2020   Allergic rhinitis 07/28/2017   Colon cancer screening 03/14/2017   History of syncope 01/16/2015   Medicare annual wellness visit, subsequent 12/24/2012   Osteopenia due to cancer therapy 10/22/2011   Personal history of breast cancer 04/13/2011   Screening for cervical cancer 04/13/2011   Hyperlipidemia    GERD (gastroesophageal reflux disease)     PCP: Marylynn Verneita CROME, MD  REFERRING PROVIDER: Marylynn Verneita CROME, MD  REFERRING DIAG: [phone] (ICD-10-CM) - Cervical spondylitis with radiculitis    RATIONALE FOR EVALUATION AND TREATMENT: Rehabilitation  THERAPY DIAG: Radiculopathy, cervical region  Pain in right arm  Muscle weakness (generalized)  ONSET DATE: 02/16/24, began with R arm pain   FOLLOW-UP APPT SCHEDULED WITH REFERRING PROVIDER: No   PERTINENT HISTORY: Patient is an 84 year old female referred for upper quarter pain; R upper arm pain with referring diagnosis of cervical spondylitis with radiculitis. Pt had prednisone  taper and this notably improved pain. Pt is active and likes to exercise. Patient has f/u with neurosurgery 04/11/24. Pt felt that working in her yard let to onset of symptoms. Pt reports continuing with resistance training exercises at the time, and she feels that this may have aggravated it too. Patient reports difficulty with getting adequate sleep. Patient reports sleeping 3-4 hours, and then she's up and down through the remainder of the night. Pt is s/p R mastectomy 26 years ago. Pt reports pain in R upper arm. She reports pain has been worse in afternoon/evening generally.   Pain:  Pain Intensity: Present: 5/10, Best: 0/10, Worst: 8-10/10 Pain location: Upper arm/middle deltoid region Pain Quality: burning  Radiating: Yes ; R upper arm  Numbness/Tingling: Yes; affecting no specific digits, tingling into thenar region notably  Focal Weakness: Yes, harder to manipulate items with R hand Aggravating factors: pushing, pulling, knitting for prolonged period, pushing down for sit to stand Relieving factors: when  walking/on the move,  24-hour pain behavior: worse at night time History of prior neck injury, pain, surgery, or therapy: No  Dominant hand: right Imaging: Yes ;  CLINICAL DATA:  Neck pain   EXAM: DG CERVICAL SPINE COMPLETE 4+V   COMPARISON:  None Available.   FINDINGS: Frontal, bilateral oblique, lateral views of the cervical spine are obtained. Mild left convex cervical scoliosis. Otherwise alignment is anatomic. No acute displaced  fracture. Mild multilevel spondylosis and facet hypertrophy, with disc space narrowing most pronounced at the C5-6 and C6-7 levels. Evaluation of the neural foramina is limited by suboptimal positioning. Prevertebral soft tissues are unremarkable. Lung apices are clear.   IMPRESSION: 1. Mild multilevel cervical spondylosis and facet hypertrophy. 2. Mild left convex cervical scoliosis. 3. No acute displaced fracture.     Electronically Signed   By: Ozell Daring M.D.   On: 02/22/2024 21:17   Red flags (personal history of cancer, h/o spinal tumors, history of compression fracture, chills/fever, night sweats, nausea, vomiting, unrelenting pain): Positive for Hx of breast cancer   PRECAUTIONS: None  WEIGHT BEARING RESTRICTIONS: No  FALLS: Has patient fallen in last 6 months? No  Living Environment Lives with: lives with their spouse Lives in: House/apartment  Prior level of function: Independent  Occupational demands: Retired  Presenter, Broadcasting: Archivist, working in yard  Patient Goals: Sleep better, not hurt as much    OBJECTIVE (data from initial evaluation unless otherwise dated):   Patient Surveys  NDI:  NECK DISABILITY INDEX  Date: 03/21/24 Score  Pain intensity 2 = The pain is moderate at the moment  2. Personal care (washing, dressing, etc.) 1 =  I can look after myself normally but it causes extra pain  3. Lifting 2 = Pain prevents me lifting heavy weights off the floor, but I can manage if they are  conveniently placed, for example on a table  4. Reading 1 = I can read as much as I want to with slight pain in my neck  5. Headaches 0 = I have no headaches at all  6. Concentration 1 =  I can concentrate fully when I want to with slight difficulty   7. Work 3 =  I cannot do my usual work  8. Driving 1 =  I can drive my car as long as I want with slight pain in my neck  9. Sleeping 4 = My sleep is greatly disturbed (3-5 hrs sleepless)   10. Recreation 2 = I am able to  engage in most, but not all of my usual recreation activities because of   pain in my neck  Total 17/50    Posture Mild forward head, rounded shoulders  AROM Shoulder AROM  Flexion: R 83*, L 139  Abduction: R 45*, L 135  Functional ER: R T3, L T3  Functional IR:  R L2*, L T8   AROM (Normal range in degrees) AROM 03/21/24 AROM  05/07/24  Cervical   Flexion (50) WNL   Extension (80) 37 65  Right lateral flexion (45) 38 40  Left lateral flexion (45) 34 40  Right rotation (85) 70 75  Left rotation (85) 67 65  (* = pain; Blank rows = not tested)   MMT MMT (out of 5) Right 03/21/24 Left 03/21/24 Right  05/07/24 Left  05/07/24        Shoulder     Flexion 4-* 4 4* 4+  Extension      Abduction 3+* 4- 4-* 4  External rotation 4- 4- 4- 4-  Internal rotation      Horizontal abduction      Horizontal adduction      Lower Trapezius      Rhomboids            Elbow    Flexion 4+* 5 4+ 5  Extension 4+ 4+ 4+ 4+  Pronation      Supination            Wrist    Flexion 4+ 5    Extension 4* (upper arm) 5    Radial deviation      Ulnar deviation      (* = pain; Blank rows = not tested)  Sensation Grossly intact to light touch bilateral UE as determined by testing dermatomes C2-T2. Proprioception and hot/cold testing deferred on this date.  Reflexes R/L Elbow: Unable to obtain Brachioradialis: Unable to obtain/1+  Tricep: 1+/1+  Palpation Location LEFT  RIGHT           Suboccipitals  0  Cervical paraspinals 0 0  Upper Trapezius 0 0  Levator Scapulae 0 1  Rhomboid Major/Minor  1  (Blank rows = not tested) Graded on 0-4 scale (0 = no pain, 1 = pain, 2 = pain with wincing/grimacing/flinching, 3 = pain with withdrawal, 4 = unwilling to allow palpation), (Blank rows = not tested)  Repeated Movements Repeated cervical retraction: no effect during, no effect after; Repeated cervical retraction-extension: no effect during, slight decrease after  Passive Accessory  Intervertebral Motion No pain with CPA or UPA. Hypomobile C4-7 CPA. Decreased R to L sideglide at C4-7.   SPECIAL TESTS Spurlings A (ipsilateral lateral flexion/axial compression): R: Positive for referred R upper quarter/arm pain L: Negative Distraction Test: Positive for pain relief  Hoffman Sign (cervical cord compression): R: Negative L: Negative ULTT Median: R: Positive L: Negative ULTT Ulnar: R: Negative L: Negative ULTT Radial: R: Negative L: Negative  Update 04/16/24 Empty Can Test: R Positive, L Negative External rotation MMT: Negative for pain provocation  Drop Arm Test: Negative Painful Arc: Negative      TODAY'S TREATMENT  05/07/2024    SUBJECTIVE STATEMENT:   patient reports R shoulder is still ongoing. Reports 5/10 pain in R shoulder but neck is better.   Physical therapy treatment session today consisted of completing assessment of goals and administration of testing as demonstrated and documented in flow sheet, treatment, and goals section of this note. Addition treatments may be found below.   Worst pain: 6/10 (mostly in R shoulder) Cervical ROM (pain-free): see chart above  NDI: 12/50 = 24% Strength: see chart above - slight improvement    Discussed following up with orthopedic regarding R shoulder as pain has been consistent with no improvement, patient verbalized understanding and in agreement     PATIENT EDUCATION:  Education details: see above for patient education details Person educated: Patient Education method: Explanation, Demonstration, and Handouts Education comprehension: verbalized understanding and returned demonstration   HOME EXERCISE PROGRAM:  Access Code: [email or web address] URL: [email or web address] Date: 04/16/2024 Prepared by: Venetia Endo  Exercises - Seated Cervical Retraction and Extension  - 5-6 x daily - 7 x weekly - 1 sets - 10 reps - 1sec hold - Median Nerve Flossing - Tray (Mirrored)  - 2-3 x daily - 7 x weekly - 2  sets - 10 reps - 1sec hold - Seated Scapular Retraction  - 2-3 x daily - 7 x weekly - 2 sets - 10 reps - 3sec  hold - Supine Shoulder Flexion Extension AAROM with Dowel  - 2 x daily - 7 x weekly - 2 sets - 10 reps - Standing Shoulder Row with Anchored Resistance  - 1 x daily - 7 x weekly - 2 sets - 10 reps - 3sec hold - Shoulder External Rotation and Scapular Retraction with Resistance  - 1 x daily - 7 x weekly - 2 sets - 10 reps   ASSESSMENT:  CLINICAL IMPRESSION:    Patient has demonstrated significant progress with cervical ROM and pain, however ongoing R shoulder pain which remains unchanged despite interventions provided. Discussed with patient about following up with ortho MD at end of month to find out options and further imaging if warranted, patient in agreement. Patient appreciative and will call our office after ortho appointment 05/28/24 to discuss if new referral for PT or other options are warranted. Patient to be discharged from skilled PT services to independent participation of HEP and follow up with ortho MD.   OBJECTIVE IMPAIRMENTS: decreased ROM, decreased strength, hypomobility, impaired UE functional use, postural dysfunction, and pain.   ACTIVITY LIMITATIONS: carrying, lifting, sleeping, reach over head, and hygiene/grooming  PARTICIPATION LIMITATIONS: meal prep, cleaning, laundry, driving, shopping, and yard work  PERSONAL FACTORS: Age, Past/current experiences, and 3+ comorbidities: (Hx of breast cancer/R mastectomy, HLD, osteopenia) are also affecting patient's functional outcome.   REHAB POTENTIAL: Good  CLINICAL DECISION MAKING: Evolving/moderate complexity  EVALUATION COMPLEXITY: Moderate   GOALS: Goals reviewed with patient? Yes  SHORT TERM GOALS: Target date: 04/13/2024  Pt will be independent with HEP to improve strength and decrease neck pain to improve pain-free function at home and work. Baseline: 03/21/24: Baseline repeated movement program  initiated.; 05/07/24: independent with HEP  Goal status: ACHIEVED    LONG TERM GOALS: Target date: 05/02/2024  Pt will have access to full cervical spine AROM without reproduction of pain as needed for scanning environment, driving, and overhead activity Baseline: 03/21/24: Decreased C-spine extension and L>R rotation AROM.; 05/07/24: All ROM WFL   Goal status: MET   2.  Pt will decrease worst upper quarter pain by at least 3 points on the NPRS in order to demonstrate clinically significant reduction in neck pain. Baseline: 03/21/24: 8-10/10 at worst.; 05/09/23: 6/10 pain at worst  Goal status: ONGOING  3.  Pt will decrease NDI score by at least 19% in order demonstrate clinically significant reduction in neck pain/disability.       Baseline: 03/21/24: 17/50; 05/07/24: 12/50 = 24% Goal status: ONGOING  4.  Patient will have MMT 4+/5 or greater for deltoid and biceps without reproduction of symptoms indicative of improved strength and tolerance to load required for daily lifting and carrying tasks.  Baseline: 03/21/24: R shoulder flexion 4-, shoulder abduction 3+, biceps 4+ with pain reproduction. 05/07/24: see above - R shoulder pain continues to be limiting factor   Goal status: ONGOING   PLAN: PT FREQUENCY: 1-2x/week  PT DURATION: 6 weeks  PLANNED INTERVENTIONS: Therapeutic exercises, Therapeutic activity, Neuromuscular re-education, Balance training, Gait training, Patient/Family education, Self Care, Joint mobilization, Joint manipulation, Vestibular training, Canalith repositioning, Orthotic/Fit training, DME instructions, Dry Needling, Electrical stimulation, Spinal manipulation, Spinal mobilization, Cryotherapy, Moist heat, Taping, Traction, Ultrasound, Ionotophoresis 4mg /ml Dexamethasone , Manual therapy, and Re-evaluation.  PLAN FOR NEXT SESSION: Continue with periscapular isometrics/isotonics. Neurodynamics. Continue with addition of posterior cuff strengthening and progressive upper  limb PRE as tolerated.   Maryanne Finder, PT, DPT Physical Therapist - [place]  [facility]  A Dauna Ziska, PT 05/07/2024, 12:41 PM  "

## 2024-05-09 ENCOUNTER — Encounter: Payer: Self-pay | Admitting: Internal Medicine

## 2024-05-09 ENCOUNTER — Ambulatory Visit (INDEPENDENT_AMBULATORY_CARE_PROVIDER_SITE_OTHER): Admitting: Internal Medicine

## 2024-05-09 ENCOUNTER — Ambulatory Visit: Admitting: Physical Therapy

## 2024-05-09 VITALS — BP 122/62 | HR 80 | Temp 97.6°F | Ht 67.0 in | Wt 166.4 lb

## 2024-05-09 DIAGNOSIS — M5412 Radiculopathy, cervical region: Secondary | ICD-10-CM

## 2024-05-09 DIAGNOSIS — M4692 Unspecified inflammatory spondylopathy, cervical region: Secondary | ICD-10-CM

## 2024-05-09 DIAGNOSIS — G8929 Other chronic pain: Secondary | ICD-10-CM | POA: Diagnosis not present

## 2024-05-09 DIAGNOSIS — E042 Nontoxic multinodular goiter: Secondary | ICD-10-CM | POA: Diagnosis not present

## 2024-05-09 DIAGNOSIS — M25511 Pain in right shoulder: Secondary | ICD-10-CM

## 2024-05-09 MED ORDER — LUBIPROSTONE 8 MCG PO CAPS: 8.0000 ug | ORAL_CAPSULE | Freq: Two times a day (BID) | ORAL | 2 refills | Status: AC

## 2024-05-09 NOTE — Progress Notes (Signed)
 "  Subjective:  Patient ID: Lori Caldwell, female    DOB: 03-Nov-1940  Age: 84 y.o. MRN: [id number]  CC: The primary encounter diagnosis was Non-toxic multinodular goiter. Diagnoses of Cervical spondylitis with radiculitis and Chronic right shoulder pain were also pertinent to this visit.   HPI Lori Caldwell presents for No chief complaint on file.   [name] is an 84 yr old female with a remote  history of breast cancer s/p right mastectomy in 2000  who presents to discuss the results of a recent ULTRASOUND OF THYROID  which was done  to investigate incidental thyroid  nodules seen on an MRI cervical spine ordered by Neurosurgery.   IMPRESSION: 1. Enlarged, heterogeneous and multinodular thyroid  gland most consistent with multinodular goiter. 2. Nodule # 6 is a 1.8 cm TI-RADS category 4 nodule in the left mid to lower gland meets criteria to consider fine-needle aspiration biopsy. Biopsy is recommended. 3. Nodule # 4 is a 2.4 cm TI-RADS category 3 nodule in the left upper gland meets criteria for imaging surveillance. Recommend follow-up ultrasound in 1 year.  Thyroid  function is normal, and she has been scheduled to see Dr Eletha to discuss FNA/biopsy of Nodule #6 in the left gland.   2) Right shoulder/upper arm pain:  she has had ibeen evaluated by neurosrgery and orthopedics  with maging of shoulder and cervical spine done to determine the etiology of her pain .  She has completed PT for shoulder  and cervical spondylitis.  Symptoms did improve with prednisone  taper an with PT.      Outpatient Medications Prior to Visit  Medication Sig Dispense Refill   aspirin 81 MG tablet Take 81 mg by mouth daily.     Cholecalciferol 25 MCG (1000 UT) tablet Take by mouth.     Docusate Sodium (DSS) 100 MG CAPS Take 2 capsules by mouth at bedtime.     Multiple Vitamin (MULTIVITAMIN) tablet Take 1 tablet by mouth daily.     simvastatin  (ZOCOR ) 40 MG tablet Take 1 tablet (40 mg total) by mouth  every evening. 90 tablet 1   lubiprostone  (AMITIZA ) 8 MCG capsule Take 1 capsule (8 mcg total) by mouth 2 (two) times daily with a meal. (Patient taking differently: Take 8 mcg by mouth daily.) 60 capsule 2   No facility-administered medications prior to visit.    Review of Systems;  Patient denies headache, fevers, malaise, unintentional weight loss, skin rash, eye pain, sinus congestion and sinus pain, sore throat, dysphagia,  hemoptysis , cough, dyspnea, wheezing, chest pain, palpitations, orthopnea, edema, abdominal pain, nausea, melena, diarrhea, constipation, flank pain, dysuria, hematuria, urinary  Frequency, nocturia, numbness, tingling, seizures,  Focal weakness, Loss of consciousness,  Tremor, insomnia, depression, anxiety, and suicidal ideation.      Objective:  BP 122/62 (BP Location: Left Arm)   Pulse 80   Temp 97.6 F (36.4 C)   Ht 5' 7 (1.702 m)   Wt 166 lb 6.4 oz (75.5 kg)   SpO2 97%   BMI 26.06 kg/m   BP Readings from Last 3 Encounters:  05/09/24 122/62  04/11/24 128/80  02/21/24 (!) 150/68    Wt Readings from Last 3 Encounters:  05/09/24 166 lb 6.4 oz (75.5 kg)  04/11/24 163 lb 2 oz (74 kg)  02/21/24 163 lb 3.2 oz (74 kg)    Physical Exam Vitals reviewed.  Constitutional:      General: She is not in acute distress.    Appearance: Normal appearance. She is normal  weight. She is not ill-appearing, toxic-appearing or diaphoretic.  HENT:     Head: Normocephalic.  Eyes:     General: No scleral icterus.       Right eye: No discharge.        Left eye: No discharge.     Conjunctiva/sclera: Conjunctivae normal.  Cardiovascular:     Rate and Rhythm: Normal rate and regular rhythm.     Heart sounds: Normal heart sounds.  Pulmonary:     Effort: Pulmonary effort is normal. No respiratory distress.     Breath sounds: Normal breath sounds.  Musculoskeletal:        General: Normal range of motion.  Skin:    General: Skin is warm and dry.  Neurological:      General: No focal deficit present.     Mental Status: She is alert and oriented to person, place, and time. Mental status is at baseline.  Psychiatric:        Mood and Affect: Mood normal.        Behavior: Behavior normal.        Thought Content: Thought content normal.        Judgment: Judgment normal.     Lab Results  Component Value Date   HGBA1C 5.7 08/04/2023    Lab Results  Component Value Date   CREATININE 0.76 08/04/2023   CREATININE 0.87 06/23/2022   CREATININE 0.74 10/09/2021    Lab Results  Component Value Date   WBC 5.3 08/04/2023   HGB 14.4 08/04/2023   HCT 43.4 08/04/2023   PLT 222.0 08/04/2023   GLUCOSE 76 08/04/2023   CHOL 174 08/04/2023   TRIG 127.0 08/04/2023   HDL 70.60 08/04/2023   LDLDIRECT 81.0 08/04/2023   LDLCALC 78 08/04/2023   ALT 18 08/04/2023   AST 26 08/04/2023   NA 138 08/04/2023   K 4.0 08/04/2023   CL 105 08/04/2023   CREATININE 0.76 08/04/2023   BUN 15 08/04/2023   CO2 23 08/04/2023   TSH 2.32 08/04/2023   HGBA1C 5.7 08/04/2023    US  THYROID  Result Date: 04/30/2024 CLINICAL DATA:  Incidental on MRI. EXAM: THYROID  ULTRASOUND TECHNIQUE: Ultrasound examination of the thyroid  gland and adjacent soft tissues was performed. COMPARISON:  None Available. FINDINGS: Parenchymal Echotexture: Moderately heterogenous Isthmus: 0.3 cm Right lobe: 3.1 x 0.6 x 1.6 Left lobe: 4.7 x 1.8 x 2.1 cm _________________________________________________________ Estimated total number of nodules >/= 1 cm: 3 Number of spongiform nodules >/=  2 cm not described below (TR1): 0 Number of mixed cystic and solid nodules >/= 1.5 cm not described below (TR2): 0 _________________________________________________________ Heterogeneous and enlarged thyroid  gland containing multiple small cysts and nodules due in the isthmus and right gland. Several larger nodules are present on the left. Nodule # 4: Solid isoechoic nodule in the left upper gland measures 2.4 x 1.6 x 1.2 cm.  TI-RADS category 3. *Given size (>/= 1.5 - 2.4 cm) and appearance, a follow-up ultrasound in 1 year should be considered based on TI-RADS criteria. Nodule # 5: Isoechoic solid nodule in the left mid gland measures approximately 1.2 x 0.8 x 0.6 cm. TI-RADS category 3. Given size (<1.4 cm) and appearance, this nodule does NOT meet TI-RADS criteria for biopsy or dedicated follow-up. Nodule # 6: Isoechoic solid nodule with punctate echogenic foci in the left mid to lower gland measures 1.8 x 1.5 x 1.5 cm. Findings are consistent with TI-RADS category 4. **Given size (>/= 1.5 cm) and appearance, fine needle aspiration of  this moderately suspicious nodule should be considered based on TI-RADS criteria. IMPRESSION: 1. Enlarged, heterogeneous and multinodular thyroid  gland most consistent with multinodular goiter. 2. Nodule # 6 is a 1.8 cm TI-RADS category 4 nodule in the left mid to lower gland meets criteria to consider fine-needle aspiration biopsy. Biopsy is recommended. 3. Nodule # 4 is a 2.4 cm TI-RADS category 3 nodule in the left upper gland meets criteria for imaging surveillance. Recommend follow-up ultrasound in 1 year. The above is in keeping with the ACR TI-RADS recommendations - J Am Coll Radiol 2017;14:587-595. Electronically Signed   By: Wilkie Lent M.D.   On: 04/30/2024 12:55    Assessment & Plan:  .Non-toxic multinodular goiter Assessment & Plan: Normal thryoid function.  Biopsy of nodule #6 recommended; referral in process/    Cervical spondylitis with radiculitis Assessment & Plan: Pain in right shouler has improved but not resolved  with PT>  continue suppportive care. She has a second opinion in the near future with Dr Edie and may consider ESI cervical spine pending his evaluation    Chronic right shoulder pain Assessment & Plan: Present for 3 months , aggravated by a prolonged yardwork activities.  Completed PT; cervical spine and plain films of right shoulder done by  neurosurgery and orthopedics; for second opinion form Dr Edie    Other orders -     Lubiprostone ; Take 1 capsule (8 mcg total) by mouth 2 (two) times daily with a meal.  Dispense: 60 capsule; Refill: 2     I spent 34 minutes on the day of this face to face encounter reviewing patient's  most recent visit with  neurosuregery, orthopedics,  prior relevant surgical and non surgical procedures, recent  llabs and imaging studies, counseling on  pain  management,  reviewing the assessment and plan with patient, and post visit ordering and reviewing of  diagnostics and therapeutics with patient  .   Follow-up: Return in about 6 months (around 11/06/2024).   Verneita LITTIE Kettering, MD "

## 2024-05-09 NOTE — Patient Instructions (Addendum)
 FOR CHRONIC ARTHRITIS PAIN   You can take 3000 mg of acetominophen (tylenol) every day safely  In divided doses (750 mg  every 6 hours  Or 1000 mg every 8 hours  OF 2000 MG AT BEDTIME )   USE HEAT, ICE  . ALTERNATE  AND TRY PLACING SALON PAS PATCH WITH LIDOCAINE  ONCE DAILY   YOU MAY END P SEEING DR CHESNIS FOR AN EPIDURAL STEROID INJECTIONS IN THE CERVICAL SPINE IF DR POGGI AGREES THAT PART OF YORU SHOULDER PAIN IS REFERRED FROM THE SPINE   I RECOMMEND GETTING THE MRI IMAGES PLACED A A USB OR FLOPPY DISK BY THE [facility] RADIOLOGY DEPT

## 2024-05-11 ENCOUNTER — Encounter: Payer: Self-pay | Admitting: Internal Medicine

## 2024-05-11 DIAGNOSIS — G8929 Other chronic pain: Secondary | ICD-10-CM | POA: Insufficient documentation

## 2024-05-11 DIAGNOSIS — M4692 Unspecified inflammatory spondylopathy, cervical region: Secondary | ICD-10-CM | POA: Insufficient documentation

## 2024-05-11 DIAGNOSIS — E042 Nontoxic multinodular goiter: Secondary | ICD-10-CM | POA: Insufficient documentation

## 2024-05-11 NOTE — Assessment & Plan Note (Signed)
 Normal thryoid function.  Biopsy of nodule #6 recommended; referral in process/

## 2024-05-11 NOTE — Assessment & Plan Note (Addendum)
 Pain in right shouler has improved but not resolved  with PT>  continue suppportive care. She has a second opinion in the near future with Dr Edie and may consider ESI cervical spine pending his evaluation

## 2024-05-11 NOTE — Assessment & Plan Note (Signed)
 Present for 3 months , aggravated by a prolonged yardwork activities.  Completed PT; cervical spine and plain films of right shoulder done by neurosurgery and orthopedics; for second opinion form Dr Edie

## 2024-05-14 ENCOUNTER — Other Ambulatory Visit: Payer: Self-pay | Admitting: General Surgery

## 2024-05-14 ENCOUNTER — Ambulatory Visit: Admitting: Physical Therapy

## 2024-05-14 DIAGNOSIS — E041 Nontoxic single thyroid nodule: Secondary | ICD-10-CM

## 2024-05-16 ENCOUNTER — Ambulatory Visit: Admitting: Physical Therapy

## 2024-05-18 NOTE — Progress Notes (Unsigned)
 "  Referring Physician:  Marylynn Verneita CROME, MD [address] [place],  [place] [address]  Primary Physician:  Lori Verneita CROME, MD  History of Present Illness: [name] has a history of GERD, ovarian cyst, breast CA, hyperlipidemia.   Last seen by me on 04/11/24 for neck and right shoulder pain. No arm pain. She has intermittent tingling in right hand.   She has known cervical spondylosis with mild DDD C5-C7. Some of her pain appeared to be right shoulder mediated- she had pain with ROM, pain with stress of rotator cuff, and positive impingement.  She was to continue with PT for her neck. She was sent to ortho for her right shoulder. She declined injection and they sent some orders to PT for her shoulder.    She is here to review her cervical MRI.   We discussed via MyChart that MRI showed thyroid  nodules and US  was recommended. Her PCP ordered this and she is scheduled for biopsy on 05/29/24.   She did PT for her neck and this helped her neck pain. She has intermittent neck pain that is tolerable. She still has intermittent right shoulder pain as well. Numbness and tingling in right has is occasional and tolerable.   She has not started PT for her shoulder- she has appointment scheduled with Dr. Edie in 2 weeks.   She is taking prn tylenol.   Tobacco use: Does not smoke.   Bowel/Bladder Dysfunction: none  Conservative measures:  Physical therapy: initial eval with [facility] 03/21/2024 with 3 additional visits through 05/07/24 when she was discharged.  Multimodal medical therapy including regular antiinflammatories:  Biofreeze, Tylenol, Tramadol , Medrol  dosepak Injections:  no epidural steroid injections  Past Surgery: no spine surgery  [name] has no symptoms of cervical myelopathy.  The symptoms are causing a significant impact on the patient's life.   Review of Systems:  A 10 point review of systems is negative, except for the pertinent positives and  negatives detailed in the HPI.  Past Medical History: Past Medical History:  Diagnosis Date   Breast cancer (HCC) 2000   RT MASTECTOMY   GERD (gastroesophageal reflux disease)    Hyperlipidemia    Personal history of chemotherapy 2000   BREAST CA   Personal history of radiation therapy 2000   BREAST CA    Past Surgical History: Past Surgical History:  Procedure Laterality Date   BREAST SURGERY     mastectomy, left breast    COLONOSCOPY WITH PROPOFOL  N/A 02/10/2022   Procedure: COLONOSCOPY WITH PROPOFOL ;  Surgeon: Toledo, Ladell POUR, MD;  Location: [facility] ENDOSCOPY;  Service: Gastroenterology;  Laterality: N/A;   MASTECTOMY Right 2000   BREAST CA   ROBOTIC ASSISTED TOTAL HYSTERECTOMY  06/16/2021    Allergies: Allergies as of 05/24/2024   (No Known Allergies)    Medications: Outpatient Encounter Medications as of 05/24/2024  Medication Sig   aspirin 81 MG tablet Take 81 mg by mouth daily.   Cholecalciferol 25 MCG (1000 UT) tablet Take by mouth.   Docusate Sodium (DSS) 100 MG CAPS Take 2 capsules by mouth at bedtime.   lubiprostone  (AMITIZA ) 8 MCG capsule Take 1 capsule (8 mcg total) by mouth 2 (two) times daily with a meal.   Multiple Vitamin (MULTIVITAMIN) tablet Take 1 tablet by mouth daily.   simvastatin  (ZOCOR ) 40 MG tablet Take 1 tablet (40 mg total) by mouth every evening.   No facility-administered encounter medications on file as of 05/24/2024.  Social History: Social History   Tobacco Use   Smoking status: Never   Smokeless tobacco: Never  Vaping Use   Vaping status: Never Used  Substance Use Topics   Alcohol use: Yes    Comment: OCC   Drug use: No    Family Medical History: Family History  Problem Relation Age of Onset   Hypertension Mother    Heart disease Father    Cancer Maternal Grandmother    Breast cancer Neg Hx     Physical Examination: There were no vitals filed for this visit.    Awake, alert, oriented to person, place, and time.   Speech is clear and fluent. Fund of knowledge is appropriate.   Cranial Nerves: Pupils equal round and reactive to light.  Facial tone is symmetric.    No cervical tenderness.   She has reasonable ROM of right shoulder with pain. No pain with IR/ER of shoulder. She has pain with stress of rotator cuff and positive impingement.   No abnormal lesions on exposed skin.   Strength: Side Biceps Triceps Deltoid Interossei Grip Wrist Ext. Wrist Flex.  R 5 5 5 5 5 5 5   L 5 5 5 5 5 5 5    Reflexes are 2+ and symmetric at the biceps, brachioradialis.   Hoffman's is absent.  Clonus is not present.   Bilateral upper extremity sensation is intact to light touch.     Gait is normal.     Medical Decision Making  Imaging: Cervical MRI dated 04/17/25:  FINDINGS:   BONES AND ALIGNMENT: Normal alignment. Normal vertebral body heights. Bone marrow signal is unremarkable. Benign hemangioma noted within the T1 vertebral body. No worrisome osseous lesions.   SPINAL CORD: Normal spinal cord size. No abnormal spinal cord signal.   SOFT TISSUES: No paraspinal mass. Mild edema within the right posterior paraspinal soft tissues adjacent to the right T6 to T7 facet, favored to be reactive in nature due to facet arthritis (series 108, image 1). Few left thyroid  nodules noted, largest of which measures 1.7 cm.   C2-C3: Unremarkable.   C3-C4: Minimal annular disc bulge. No spinal canal stenosis or neural foraminal narrowing.   C4-C5: Tiny central disc protrusion minimally indents ventral thecal sac. No spinal canal stenosis or neural foraminal narrowing.   C5-C6: Minimal disc bulge. Mild right-sided facet hypertrophy. No spinal canal stenosis or neural foraminal narrowing.   C6-C7: Intervertebral disc space narrowing. Mild diffuse disc bulge or uncovertebral spurring. Mild right-sided facet hypertrophy. No spinal canal stenosis. Neural foramina remain patent.   C7-T1: Normal interspace.  Minimal facet spurring. No spinal canal stenosis or neural foraminal narrowing.   IMPRESSION: 1. Mild cervical spondylosis for age without significant stenosis or neural impingement. 2. Mild right-sided facet hypertrophy at C5-C6 and C6-c7 with . mild reactive marrow edema within the adjacent paraspinal soft tissues. Findings could contribute to neck pain. 3. Few left thyroid  nodules, largest measuring 1.7 cm; recommend non-emergent thyroid  ultrasound.   Electronically signed by: Morene Hoard MD 04/18/2024 07:06 AM EST RP Workstation: [id number]   I have personally reviewed the images and agree with the above interpretation.  Assessment and Plan: Ms. Devenport is doing better. She only has intermittent right sided neck pain that is tolerable. She continues with intermittent right shoulder pain. Numbness and tingling in right hand is only occasional.   She has known cervical spondylosis with mild DDD C5-C7. She has mild right-sided facet hypertrophy at C5-C6 and C6-C7. No central or foraminal stenosis.  I think her right shoulder pain is still shoulder mediated.   Treatment options discussed with patient and following plan made:   - She will continue with HEP from PT for cervical spine.  - If neck pain gets worse, can refer to PMR at [facility] for possible injections.  - Follow up with Dr. Edie as scheduled for her right shoulder pain.  - She will f/u with me prn.   I spent a total of 20 minutes in face-to-face and non-face-to-face activities related to this patient's care today including review of outside records, review of imaging, review of symptoms, physical exam, discussion of differential diagnosis, discussion of treatment options, and documentation.   Glade Boys PA-C Dept. of Neurosurgery  "

## 2024-05-21 ENCOUNTER — Ambulatory Visit: Admitting: Physical Therapy

## 2024-05-22 ENCOUNTER — Ambulatory Visit: Payer: Medicare Other

## 2024-05-23 ENCOUNTER — Ambulatory Visit: Admitting: Physical Therapy

## 2024-05-24 ENCOUNTER — Encounter: Payer: Self-pay | Admitting: Orthopedic Surgery

## 2024-05-24 ENCOUNTER — Ambulatory Visit (INDEPENDENT_AMBULATORY_CARE_PROVIDER_SITE_OTHER): Admitting: Orthopedic Surgery

## 2024-05-24 VITALS — BP 124/82 | Wt 166.0 lb

## 2024-05-24 DIAGNOSIS — M50323 Other cervical disc degeneration at C6-C7 level: Secondary | ICD-10-CM | POA: Diagnosis not present

## 2024-05-24 DIAGNOSIS — M50322 Other cervical disc degeneration at C5-C6 level: Secondary | ICD-10-CM | POA: Diagnosis not present

## 2024-05-24 DIAGNOSIS — M47812 Spondylosis without myelopathy or radiculopathy, cervical region: Secondary | ICD-10-CM | POA: Diagnosis not present

## 2024-05-24 DIAGNOSIS — M5030 Other cervical disc degeneration, unspecified cervical region: Secondary | ICD-10-CM

## 2024-05-24 DIAGNOSIS — M25511 Pain in right shoulder: Secondary | ICD-10-CM

## 2024-05-28 ENCOUNTER — Ambulatory Visit: Admitting: Physical Therapy

## 2024-05-29 ENCOUNTER — Ambulatory Visit
Admission: RE | Admit: 2024-05-29 | Discharge: 2024-05-29 | Disposition: A | Source: Ambulatory Visit | Attending: General Surgery

## 2024-05-29 ENCOUNTER — Other Ambulatory Visit (HOSPITAL_COMMUNITY)
Admission: RE | Admit: 2024-05-29 | Discharge: 2024-05-29 | Disposition: A | Discharge status: Home / Self Care | Source: Ambulatory Visit | Attending: Physician Assistant | Admitting: Physician Assistant

## 2024-05-29 DIAGNOSIS — E041 Nontoxic single thyroid nodule: Secondary | ICD-10-CM | POA: Diagnosis present

## 2024-05-30 ENCOUNTER — Ambulatory Visit: Admitting: Physical Therapy

## 2024-05-31 LAB — CYTOLOGY - NON PAP

## 2024-06-04 ENCOUNTER — Ambulatory Visit: Admitting: Physical Therapy

## 2024-06-05 ENCOUNTER — Telehealth: Payer: Self-pay | Admitting: *Deleted

## 2024-06-05 NOTE — Telephone Encounter (Signed)
 Copied from CRM #[id number]. Topic: Clinical - Lab/Test Results >> Jun 05, 2024  9:52 AM Corin V wrote: Reason for CRM: Patient had a biopsy last week and she has still not heard back about any results. She is uncertain which provider is going to be the one to tell her the results and who she needs to speak to about it since there were multiple providers involved with her needing to get the biopsy. Please call back advising her of results or who to speak to about the results. Call back: [phone]

## 2024-06-07 ENCOUNTER — Encounter: Payer: Self-pay | Admitting: Internal Medicine

## 2024-06-07 NOTE — Telephone Encounter (Signed)
 Notified via mychart.

## 2024-06-13 ENCOUNTER — Ambulatory Visit: Admitting: Physical Therapy
